# Patient Record
Sex: Male | Born: 1960 | Race: White | Hispanic: No | Smoking: Never smoker
Health system: Southern US, Community
[De-identification: ages and names within clinical notes are randomized; demographics above are authoritative.]

## PROBLEM LIST (undated history)

## (undated) DIAGNOSIS — J302 Other seasonal allergic rhinitis: Secondary | ICD-10-CM

## (undated) DIAGNOSIS — R000 Tachycardia, unspecified: Secondary | ICD-10-CM

## (undated) DIAGNOSIS — I5022 Chronic systolic (congestive) heart failure: Secondary | ICD-10-CM

## (undated) DIAGNOSIS — R1319 Other dysphagia: Secondary | ICD-10-CM

## (undated) DIAGNOSIS — I10 Essential (primary) hypertension: Secondary | ICD-10-CM

## (undated) DIAGNOSIS — I471 Supraventricular tachycardia, unspecified: Secondary | ICD-10-CM

## (undated) DIAGNOSIS — G479 Sleep disorder, unspecified: Secondary | ICD-10-CM

## (undated) DIAGNOSIS — K222 Esophageal obstruction: Secondary | ICD-10-CM

## (undated) DIAGNOSIS — D179 Benign lipomatous neoplasm, unspecified: Secondary | ICD-10-CM

## (undated) DIAGNOSIS — R079 Chest pain, unspecified: Secondary | ICD-10-CM

## (undated) DIAGNOSIS — R5383 Other fatigue: Secondary | ICD-10-CM

## (undated) DIAGNOSIS — I209 Angina pectoris, unspecified: Secondary | ICD-10-CM

## (undated) DIAGNOSIS — N400 Enlarged prostate without lower urinary tract symptoms: Secondary | ICD-10-CM

## (undated) DIAGNOSIS — Z7982 Long term (current) use of aspirin: Secondary | ICD-10-CM

## (undated) DIAGNOSIS — I4729 Other ventricular tachycardia: Secondary | ICD-10-CM

## (undated) DIAGNOSIS — M810 Age-related osteoporosis without current pathological fracture: Secondary | ICD-10-CM

## (undated) DIAGNOSIS — I77810 Thoracic aortic ectasia: Secondary | ICD-10-CM

## (undated) DIAGNOSIS — K219 Gastro-esophageal reflux disease without esophagitis: Secondary | ICD-10-CM

## (undated) DIAGNOSIS — R0609 Other forms of dyspnea: Secondary | ICD-10-CM

## (undated) DIAGNOSIS — I499 Cardiac arrhythmia, unspecified: Secondary | ICD-10-CM

## (undated) DIAGNOSIS — R55 Syncope and collapse: Secondary | ICD-10-CM

## (undated) DIAGNOSIS — Z95818 Presence of other cardiac implants and grafts: Secondary | ICD-10-CM

## (undated) DIAGNOSIS — R002 Palpitations: Secondary | ICD-10-CM

## (undated) DIAGNOSIS — I428 Other cardiomyopathies: Secondary | ICD-10-CM

## (undated) DIAGNOSIS — I5189 Other ill-defined heart diseases: Secondary | ICD-10-CM

## (undated) DIAGNOSIS — E782 Mixed hyperlipidemia: Secondary | ICD-10-CM

## (undated) DIAGNOSIS — T7840XA Allergy, unspecified, initial encounter: Secondary | ICD-10-CM

## (undated) DIAGNOSIS — I700 Atherosclerosis of aorta: Secondary | ICD-10-CM

## (undated) DIAGNOSIS — Z8249 Family history of ischemic heart disease and other diseases of the circulatory system: Secondary | ICD-10-CM

## (undated) DIAGNOSIS — I472 Ventricular tachycardia: Secondary | ICD-10-CM

## (undated) HISTORY — DX: Cardiac arrhythmia, unspecified: I49.9

## (undated) HISTORY — DX: Tachycardia, unspecified: R00.0

## (undated) HISTORY — DX: Presence of other cardiac implants and grafts: Z95.818

## (undated) HISTORY — DX: Allergy, unspecified, initial encounter: T78.40XA

## (undated) HISTORY — DX: Age-related osteoporosis without current pathological fracture: M81.0

## (undated) HISTORY — DX: Other ventricular tachycardia: I47.29

## (undated) HISTORY — DX: Ventricular tachycardia: I47.2

---
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
## 2006-08-17 ENCOUNTER — Inpatient Hospital Stay: Payer: Self-pay | Admitting: Internal Medicine

## 2006-09-08 ENCOUNTER — Ambulatory Visit: Payer: Self-pay | Admitting: Gastroenterology

## 2010-09-16 ENCOUNTER — Ambulatory Visit: Payer: Self-pay | Admitting: Urology

## 2013-01-07 ENCOUNTER — Ambulatory Visit: Payer: Self-pay | Admitting: Gastroenterology

## 2013-10-15 ENCOUNTER — Observation Stay: Payer: Self-pay | Admitting: Student

## 2013-10-15 DIAGNOSIS — I499 Cardiac arrhythmia, unspecified: Secondary | ICD-10-CM

## 2013-10-15 DIAGNOSIS — R55 Syncope and collapse: Secondary | ICD-10-CM

## 2013-10-15 DIAGNOSIS — R079 Chest pain, unspecified: Secondary | ICD-10-CM

## 2013-10-15 DIAGNOSIS — R42 Dizziness and giddiness: Secondary | ICD-10-CM

## 2013-10-15 LAB — CBC
HCT: 47.4 % (ref 40.0–52.0)
HGB: 15.6 g/dL (ref 13.0–18.0)
MCH: 29.4 pg (ref 26.0–34.0)
MCHC: 32.9 g/dL (ref 32.0–36.0)
MCV: 89 fL (ref 80–100)
PLATELETS: 220 10*3/uL (ref 150–440)
RBC: 5.3 10*6/uL (ref 4.40–5.90)
RDW: 13.1 % (ref 11.5–14.5)
WBC: 6.9 10*3/uL (ref 3.8–10.6)

## 2013-10-15 LAB — COMPREHENSIVE METABOLIC PANEL
ALT: 17 U/L (ref 12–78)
ANION GAP: 5 — AB (ref 7–16)
Albumin: 3.6 g/dL (ref 3.4–5.0)
Alkaline Phosphatase: 79 U/L
BILIRUBIN TOTAL: 0.6 mg/dL (ref 0.2–1.0)
BUN: 9 mg/dL (ref 7–18)
Calcium, Total: 8.9 mg/dL (ref 8.5–10.1)
Chloride: 103 mmol/L (ref 98–107)
Co2: 30 mmol/L (ref 21–32)
Creatinine: 0.9 mg/dL (ref 0.60–1.30)
EGFR (African American): >60
Glucose: 88 mg/dL (ref 65–99)
OSMOLALITY: 274 (ref 275–301)
POTASSIUM: 3.7 mmol/L (ref 3.5–5.1)
SGOT(AST): 14 U/L — ABNORMAL LOW (ref 15–37)
Sodium: 138 mmol/L (ref 136–145)
Total Protein: 6.9 g/dL (ref 6.4–8.2)

## 2013-10-15 LAB — TROPONIN I: Troponin-I: <0.02 ng/mL

## 2013-10-15 LAB — CK TOTAL AND CKMB (NOT AT ARMC)
CK, TOTAL: 73 U/L
CK-MB: 1.3 ng/mL (ref 0.5–3.6)

## 2013-10-15 LAB — MAGNESIUM: Magnesium: 1.9 mg/dL

## 2013-10-16 ENCOUNTER — Observation Stay (HOSPITAL_COMMUNITY)
Admission: AD | Admit: 2013-10-16 | Discharge: 2013-10-18 | Disposition: A | Discharge status: Home / Self Care | Payer: BC Managed Care – PPO | Source: Other Acute Inpatient Hospital | Attending: Internal Medicine | Admitting: Internal Medicine

## 2013-10-16 ENCOUNTER — Encounter (HOSPITAL_COMMUNITY): Payer: Self-pay | Admitting: Pharmacy Technician

## 2013-10-16 ENCOUNTER — Encounter (HOSPITAL_COMMUNITY): Payer: Self-pay | Admitting: Cardiology

## 2013-10-16 DIAGNOSIS — R079 Chest pain, unspecified: Secondary | ICD-10-CM | POA: Diagnosis not present

## 2013-10-16 DIAGNOSIS — N400 Enlarged prostate without lower urinary tract symptoms: Secondary | ICD-10-CM | POA: Diagnosis not present

## 2013-10-16 DIAGNOSIS — I471 Supraventricular tachycardia, unspecified: Principal | ICD-10-CM | POA: Insufficient documentation

## 2013-10-16 DIAGNOSIS — I2510 Atherosclerotic heart disease of native coronary artery without angina pectoris: Secondary | ICD-10-CM | POA: Insufficient documentation

## 2013-10-16 DIAGNOSIS — I209 Angina pectoris, unspecified: Secondary | ICD-10-CM

## 2013-10-16 DIAGNOSIS — I498 Other specified cardiac arrhythmias: Secondary | ICD-10-CM | POA: Insufficient documentation

## 2013-10-16 DIAGNOSIS — R5383 Other fatigue: Secondary | ICD-10-CM

## 2013-10-16 DIAGNOSIS — E785 Hyperlipidemia, unspecified: Secondary | ICD-10-CM | POA: Insufficient documentation

## 2013-10-16 DIAGNOSIS — I495 Sick sinus syndrome: Secondary | ICD-10-CM

## 2013-10-16 DIAGNOSIS — I499 Cardiac arrhythmia, unspecified: Secondary | ICD-10-CM | POA: Diagnosis present

## 2013-10-16 DIAGNOSIS — R55 Syncope and collapse: Secondary | ICD-10-CM | POA: Diagnosis not present

## 2013-10-16 DIAGNOSIS — G479 Sleep disorder, unspecified: Secondary | ICD-10-CM | POA: Insufficient documentation

## 2013-10-16 DIAGNOSIS — R000 Tachycardia, unspecified: Secondary | ICD-10-CM

## 2013-10-16 HISTORY — DX: Benign prostatic hyperplasia without lower urinary tract symptoms: N40.0

## 2013-10-16 HISTORY — DX: Sleep disorder, unspecified: G47.9

## 2013-10-16 HISTORY — DX: Syncope and collapse: R55

## 2013-10-16 HISTORY — DX: Tachycardia, unspecified: R00.0

## 2013-10-16 HISTORY — DX: Chest pain, unspecified: R07.9

## 2013-10-16 HISTORY — DX: Angina pectoris, unspecified: I20.9

## 2013-10-16 HISTORY — DX: Other fatigue: R53.83

## 2013-10-16 LAB — CBC WITH DIFFERENTIAL/PLATELET
BASOS ABS: 0 10*3/uL (ref 0.0–0.1)
Basophil %: 0.7 %
EOS PCT: 1.6 %
Eosinophil #: 0.1 10*3/uL (ref 0.0–0.7)
HCT: 48.8 % (ref 40.0–52.0)
HGB: 15.8 g/dL (ref 13.0–18.0)
LYMPHS PCT: 22.3 %
Lymphocyte #: 1.6 10*3/uL (ref 1.0–3.6)
MCH: 29.1 pg (ref 26.0–34.0)
MCHC: 32.3 g/dL (ref 32.0–36.0)
MCV: 90 fL (ref 80–100)
MONO ABS: 0.9 x10 3/mm (ref 0.2–1.0)
Monocyte %: 12.5 %
Neutrophil #: 4.4 10*3/uL (ref 1.4–6.5)
Neutrophil %: 62.9 %
Platelet: 225 10*3/uL (ref 150–440)
RBC: 5.42 10*6/uL (ref 4.40–5.90)
RDW: 13.1 % (ref 11.5–14.5)
WBC: 7 10*3/uL (ref 3.8–10.6)

## 2013-10-16 LAB — BASIC METABOLIC PANEL
Anion Gap: 7 (ref 7–16)
BUN: 11 mg/dL (ref 7–18)
CALCIUM: 8.9 mg/dL (ref 8.5–10.1)
CHLORIDE: 106 mmol/L (ref 98–107)
CO2: 28 mmol/L (ref 21–32)
CREATININE: 0.81 mg/dL (ref 0.60–1.30)
EGFR (African American): >60
Glucose: 90 mg/dL (ref 65–99)
Osmolality: 280 (ref 275–301)
POTASSIUM: 4 mmol/L (ref 3.5–5.1)
Sodium: 141 mmol/L (ref 136–145)

## 2013-10-16 LAB — COMPREHENSIVE METABOLIC PANEL
ALBUMIN: 3.4 g/dL — AB (ref 3.5–5.2)
ALT: 10 U/L (ref 0–53)
AST: 15 U/L (ref 0–37)
Alkaline Phosphatase: 76 U/L (ref 39–117)
Anion gap: 13 (ref 5–15)
BUN: 15 mg/dL (ref 6–23)
CALCIUM: 9 mg/dL (ref 8.4–10.5)
CO2: 26 mEq/L (ref 19–32)
Chloride: 101 mEq/L (ref 96–112)
Creatinine, Ser: 0.83 mg/dL (ref 0.50–1.35)
GFR calc Af Amer: >90 mL/min (ref >90)
GFR calc non Af Amer: >90 mL/min (ref >90)
GLUCOSE: 85 mg/dL (ref 70–99)
POTASSIUM: 3.7 meq/L (ref 3.7–5.3)
Sodium: 140 mEq/L (ref 137–147)
Total Bilirubin: 0.4 mg/dL (ref 0.3–1.2)
Total Protein: 6.4 g/dL (ref 6.0–8.3)

## 2013-10-16 LAB — TROPONIN I: Troponin-I: <0.02 ng/mL

## 2013-10-16 LAB — TSH: Thyroid Stimulating Horm: 0.998 u[IU]/mL

## 2013-10-16 LAB — MAGNESIUM: Magnesium: 2 mg/dL

## 2013-10-16 MED ORDER — SODIUM CHLORIDE 0.9 % IV SOLN: 250.0000 mL | INTRAVENOUS | Status: DC | PRN

## 2013-10-16 MED ORDER — HEPARIN SODIUM (PORCINE) 5000 UNIT/ML IJ SOLN
5000.0000 [IU] | Freq: Three times a day (TID) | INTRAMUSCULAR | Status: DC
  Administered 2013-10-16 – 2013-10-17 (×2): 5000 [IU] via SUBCUTANEOUS
  Filled 2013-10-16 (×5): qty 1

## 2013-10-16 MED ORDER — SODIUM CHLORIDE 0.9 % IV SOLN
INTRAVENOUS | Status: DC
  Administered 2013-10-17: 05:00:00 via INTRAVENOUS

## 2013-10-16 MED ORDER — SODIUM CHLORIDE 0.9 % IJ SOLN
3.0000 mL | Freq: Two times a day (BID) | INTRAMUSCULAR | Status: DC
  Administered 2013-10-16: 3 mL via INTRAVENOUS

## 2013-10-16 MED ORDER — ASPIRIN 81 MG PO CHEW
81.0000 mg | CHEWABLE_TABLET | ORAL | Status: AC
  Administered 2013-10-17: 81 mg via ORAL
  Filled 2013-10-16: qty 1

## 2013-10-16 MED ORDER — SODIUM CHLORIDE 0.9 % IJ SOLN: 3.0000 mL | INTRAMUSCULAR | Status: DC | PRN

## 2013-10-16 MED ORDER — SODIUM CHLORIDE 0.9 % IJ SOLN: 3.0000 mL | Freq: Two times a day (BID) | INTRAMUSCULAR | Status: DC

## 2013-10-16 NOTE — H&P (Signed)
Chief Complaint: tachycardia (chest Pain + SOB) Primary Cardiologist: New  HPI: The patient is a 53 year old male with no significant medical problems, who presented to his PCP's office yesterday for evaluation after 3-4 weeks of intermittent chest pressure/tightness, shortness of breath, dizziness and near syncope. He states that 2 weeks ago, he had a syncopal episode. This happened suddenly without prodromal symptoms. He states he did not seek medical evaluation at that time. Yesterday, he redeveloped severe symptoms of chest pain and shortness of breath, prompting him to present to Dr. Christell Faithrissman's office in [place].  There, he was felt to have supraventricular tachycardia with a heart rate up to the 280s. Subsequently, he was sent to [facility]'s emergency department. While in the ED, he had resolution of symptoms and he went back into normal sinus rhythm, with a controlled heart rate. W/u in ED was normal including CMP, Mg, TSH, cardiac enzymes and CXR. A 2D echo was obtained which demonstrated normal systolic function with an EF of 60-65% and normal valvular anatomy. He was admitted for overnight observation. There were no reported events overnight. It was decided today to transfer to [facility] for further evaluation.  [facility] has [facility] and denies any current symptoms. Telemetry demonstrates sinus bradycardia with heart rate in the mid 50s. He reports a family history of sudden death, noting that his grandfather died suddenly at the age 55. He was told that it was cardiac related. He denies alcohol and drug use. He drinks a moderate amount of caffeine on a regular basis. He drinks several cups of coffee daily and, on average, 1 energy drink a week.   Past Medical History  Diagnosis Date  . Syncope 10/16/2013  . Chest pain with high risk for cardiac etiology 10/16/2013  . 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 Fatigue due to sleep pattern disturbance 10/16/2013    No past  surgical history on file.  Family History  Problem Relation Age of Onset  . Sudden death Maternal Grandfather [id number]   Social History:  has no tobacco, alcohol, and drug history on file.  Allergies:  Allergies  Allergen Reactions  . Amoxicillin Rash    Medications Prior to Admission  Medication Sig Dispense Refill  . tamsulosin (FLOMAX) 0.4 MG CAPS capsule Take 0.4 mg by mouth daily after supper.        No results found for this or any previous visit (from the past 48 hour(s)). No results found.  Review of Systems  Constitutional: Positive for malaise/fatigue.  Respiratory: Positive for shortness of breath.   Cardiovascular: Positive for chest pain and palpitations.  Neurological: Positive for dizziness, loss of consciousness and weakness.  All other systems reviewed and are negative.   Blood pressure 125/81, pulse 57, temperature 97.8 F (36.6 C), temperature source Oral, resp. rate 19, height 6\' 3"  (1.905 m), weight 192 lb (87.091 kg), SpO2 99.00%. Physical Exam  Constitutional: He is oriented to person, place, and time. He appears well-developed and well-nourished. No distress.  Neck: No JVD present. Carotid bruit is not present.  Cardiovascular: Normal rate, regular rhythm, normal heart sounds and intact distal pulses.  Exam reveals no gallop and no friction rub.   No murmur heard. Respiratory: Effort normal and breath sounds normal. No respiratory distress. He has no wheezes. He has no rales.  GI: Soft. Bowel sounds are normal. He exhibits no distension and no mass. There is no tenderness.  Musculoskeletal: He exhibits no edema.  Neurological: He is  alert and oriented to person, place, and time.  Skin: Skin is warm and dry. He is not diaphoretic.  Psychiatric: He has a normal mood and affect. His behavior is normal.     Assessment/Plan Active Problems:   Sinus tachycardia   Chest pain with high risk for cardiac etiology   Syncope   Fatigue due to sleep pattern  disturbance   Prostatism   1. Sinus Tachycardia: his rhythm strips have been reviewed by Dr. Graciela Husbands. It appears that his ventricular rate at his PCP's office was ~150 bpm and not 300. P waves were identified. We believe this was sinus tachycardia. Continue on telemetry.   2. Chest Pain: his symptoms are concerning for unstable angina. Given his family history and associated symptoms of dyspnea, syncope/ near syncope and tachycardia, we will elect to further evaluate with a LHC. Will try to arrange for tomorrow. Will make NPO at midnight.    Sherryl Manges 10/16/2013, 6:49 PM   Sweep speed on ECG was 1/2 normal ( see standardization block) so this was sinus tach I am however concerned about his chest pain syndrome. While it is not specifically associated with exertion, his exercise routine has been cut in half over the last 3 months because of inability to complete his prior workouts. He is also significantly more fatigued following his workouts. The character of the chest pain is also worrisome with "an elephant on my chest". He uses caffeine but no other supplements.  I would favor cardiac catheterization given a reasonably high pretest likelihood for coronary disease. In the event that his coronary evaluation is negative, I would pursue further functional assessment possible with cardiopulmonary stress testing possibly with cardiac MR given the very striking change in his exercise tolerance over the last 3 months.  He also has a very unusual sleep pattern sleeping only 3 hours a night. Part of his fatigue over recent months may be chronic sleep deprivation.   He also has recurrent syncope and presyncope characterized with a stereotypical prodrome and recovery symptoms and residual orthostatic intolerance. He has been started on Flomax for prostatism. The symptoms are concurrent in time. I suggested that we stop his Flomax. We've also discussed the physiology of neurally mediated syncope, and the  importance for maintaining volume repletion. We'll undertake orthostatic vital signs.

## 2013-10-17 ENCOUNTER — Encounter (HOSPITAL_COMMUNITY): Payer: Self-pay

## 2013-10-17 ENCOUNTER — Ambulatory Visit (HOSPITAL_COMMUNITY): Admit: 2013-10-17 | Payer: Self-pay | Admitting: Internal Medicine

## 2013-10-17 ENCOUNTER — Encounter (HOSPITAL_COMMUNITY)
Admission: AD | Disposition: A | Discharge status: Home / Self Care | Payer: Self-pay | Source: Other Acute Inpatient Hospital | Attending: Internal Medicine

## 2013-10-17 DIAGNOSIS — I2510 Atherosclerotic heart disease of native coronary artery without angina pectoris: Secondary | ICD-10-CM

## 2013-10-17 DIAGNOSIS — R079 Chest pain, unspecified: Secondary | ICD-10-CM

## 2013-10-17 HISTORY — PX: LEFT HEART CATHETERIZATION WITH CORONARY ANGIOGRAM: SHX5451

## 2013-10-17 HISTORY — DX: Atherosclerotic heart disease of native coronary artery without angina pectoris: I25.10

## 2013-10-17 LAB — BASIC METABOLIC PANEL
Anion gap: 12 (ref 5–15)
BUN: 15 mg/dL (ref 6–23)
CO2: 26 mEq/L (ref 19–32)
Calcium: 9.2 mg/dL (ref 8.4–10.5)
Chloride: 100 mEq/L (ref 96–112)
Creatinine, Ser: 0.78 mg/dL (ref 0.50–1.35)
GFR calc Af Amer: >90 mL/min (ref >90)
GLUCOSE: 89 mg/dL (ref 70–99)
POTASSIUM: 4.3 meq/L (ref 3.7–5.3)
Sodium: 138 mEq/L (ref 137–147)

## 2013-10-17 LAB — CBC
HCT: 49.9 % (ref 39.0–52.0)
HEMOGLOBIN: 17.2 g/dL — AB (ref 13.0–17.0)
MCH: 30.3 pg (ref 26.0–34.0)
MCHC: 34.5 g/dL (ref 30.0–36.0)
MCV: 88 fL (ref 78.0–100.0)
PLATELETS: 240 10*3/uL (ref 150–400)
RBC: 5.67 MIL/uL (ref 4.22–5.81)
RDW: 12.7 % (ref 11.5–15.5)
WBC: 6.6 10*3/uL (ref 4.0–10.5)

## 2013-10-17 LAB — PROTIME-INR
INR: 0.99 (ref 0.00–1.49)
Prothrombin Time: 13.1 seconds (ref 11.6–15.2)

## 2013-10-17 SURGERY — SUPRAVENTRICULAR TACHYCARDIA ABLATION
Anesthesia: LOCAL

## 2013-10-17 SURGERY — LEFT HEART CATHETERIZATION WITH CORONARY ANGIOGRAM
Anesthesia: LOCAL

## 2013-10-17 MED ORDER — HEPARIN (PORCINE) IN NACL 2-0.9 UNIT/ML-% IJ SOLN
INTRAMUSCULAR | Status: AC
  Filled 2013-10-17: qty 1000

## 2013-10-17 MED ORDER — LIDOCAINE HCL (PF) 1 % IJ SOLN
INTRAMUSCULAR | Status: AC
  Filled 2013-10-17: qty 30

## 2013-10-17 MED ORDER — MIDAZOLAM HCL 2 MG/2ML IJ SOLN
INTRAMUSCULAR | Status: AC
Start: 2013-10-17 — End: 2013-10-17
  Filled 2013-10-17: qty 2

## 2013-10-17 MED ORDER — ONDANSETRON HCL 4 MG/2ML IJ SOLN: 4.0000 mg | Freq: Four times a day (QID) | INTRAMUSCULAR | Status: DC | PRN

## 2013-10-17 MED ORDER — ACETAMINOPHEN 325 MG PO TABS
650.0000 mg | ORAL_TABLET | Freq: Four times a day (QID) | ORAL | Status: DC | PRN
  Administered 2013-10-17: 650 mg via ORAL
  Filled 2013-10-17: qty 2

## 2013-10-17 MED ORDER — FENTANYL CITRATE 0.05 MG/ML IJ SOLN
INTRAMUSCULAR | Status: AC
  Filled 2013-10-17: qty 2

## 2013-10-17 MED ORDER — NITROGLYCERIN 0.2 MG/ML ON CALL CATH LAB
INTRAVENOUS | Status: AC
  Filled 2013-10-17: qty 1

## 2013-10-17 MED ORDER — SODIUM CHLORIDE 0.9 % IV SOLN: INTRAVENOUS | Status: AC

## 2013-10-17 MED ORDER — VERAPAMIL HCL 2.5 MG/ML IV SOLN
INTRAVENOUS | Status: AC
  Filled 2013-10-17: qty 2

## 2013-10-17 NOTE — Interval H&P Note (Signed)
History and Physical Interval Note:  10/17/2013 3:19 PM  [name]  has presented today for cardiac cath with the diagnosis of chest pain.  The various methods of treatment have been discussed with the patient and family. After consideration of risks, benefits and other options for treatment, the patient has consented to  Procedure(s): LEFT HEART CATHETERIZATION WITH CORONARY ANGIOGRAM (N/A) as a surgical intervention .  The patient's history has been reviewed, patient examined, no change in status, stable for surgery.  I have reviewed the patient's chart and labs.  Questions were answered to the patient's satisfaction.    Cath Lab Visit (complete for each Cath Lab visit)  Clinical Evaluation Leading to the Procedure:   ACS: No.  Non-ACS:    Anginal Classification: CCS III  Anti-ischemic medical therapy: No Therapy  Non-Invasive Test Results: No non-invasive testing performed  Prior CABG: No previous CABG        MCALHANY,CHRISTOPHER

## 2013-10-17 NOTE — H&P (View-Only) (Signed)
       Patient Name: Samer D Boehme      SUBJECTIVE:headache  But there are familiar to him  Past Medical History  Diagnosis Date  . Syncope 10/16/2013  . Chest pain with high risk for cardiac etiology 10/16/2013  . 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 Fatigue due to sleep pattern disturbance 10/16/2013    Scheduled Meds:  Scheduled Meds: . heparin subcutaneous  5,000 Units Subcutaneous 3 times per day  . sodium chloride  3 mL Intravenous Q12H  . sodium chloride  3 mL Intravenous Q12H   Continuous Infusions: . sodium chloride 75 mL/hr at 10/17/13 0510   sodium chloride, acetaminophen, sodium chloride    PHYSICAL EXAM Filed Vitals:   10/16/13 1714 10/16/13 2128 10/17/13 0423  BP: 125/81 110/72 126/90  Pulse: 57 68 65  Temp: 97.8 F (36.6 C) 97.3 F (36.3 C) 97.6 F (36.4 C)  TempSrc: Oral Oral Oral  Resp: 19 18 18  Height: 6' 3" (1.905 m)    Weight: 192 lb (87.091 kg)  187 lb 11.2 oz (85.14 kg)  SpO2: 99% 99% 100%    Well developed and nourished in no acute distress HENT normal Neck supple with JVP-flat Clear Regular rate and rhythm, no murmurs or gallops Abd-soft with active BS No Clubbing cyanosis edema Skin-warm and dry A & Oriented  Grossly normal sensory and motor function  TELEMETRY: Reviewed telemetry pt in nsr:    Intake/Output Summary (Last 24 hours) at 10/17/13 0817 Last data filed at 10/16/13 2230  Gross per 24 hour  Intake      3 ml  Output      0 ml  Net      3 ml    LABS: Basic Metabolic Panel:  Recent Labs Lab 10/16/13 2130 10/17/13 0525  NA 140 138  K 3.7 4.3  CL 101 100  CO2 26 26  GLUCOSE 85 89  BUN 15 15  CREATININE 0.83 0.78  CALCIUM 9.0 9.2   Cardiac Enzymes: No results found for this basename: CKTOTAL, CKMB, CKMBINDEX, TROPONINI,  in the last 72 hours CBC:  Recent Labs Lab 10/17/13 0525  WBC 6.6  HGB 17.2*  HCT 49.9  MCV 88.0  PLT 240   PROTIME:  Recent Labs  10/17/13 0525  LABPROT 13.1  INR 0.99   Liver  Function Tests:  Recent Labs  10/16/13 2130  AST 15  ALT 10  ALKPHOS 76  BILITOT 0.4  PROT 6.4  ALBUMIN 3.4*      ASSESSMENT AND PLAN:  Active Problems:   Sinus tachycardia   Chest pain with high risk for cardiac etiology   Syncope   Fatigue due to sleep pattern disturbance   Prostatism   Chest pain Assymetric facies v torticollis>> pt has reviewed pictures with mother who says thiat this looks like his normal For cath this am Will try ang get original ECG from [facility] office to exclude Aflutter 2:1 as there was notable peaking of pwave Anticipate discharge if cath ok  Signed, Zoraya Fiorenza MD  10/17/2013   

## 2013-10-17 NOTE — CV Procedure (Signed)
      Cardiac Catheterization Operative Report  [name] [id number] 7/9/20154:00 PM [name], MD  Procedure Performed:  1. Left Heart Catheterization 2. Selective Coronary Angiography 3. Left ventricular angiogram  Operator: Verne Carrow, MD  Arterial access site:  Right radial artery.   Indication: 53 yo male with history of arrythmias (possible atrial flutter vs SVT) being followed by the EP service. Cardiac cath to exclude CAD.                                        Procedure Details: The risks, benefits, complications, treatment options, and expected outcomes were discussed with the patient. The patient and/or family concurred with the proposed plan, giving informed consent. The patient was brought to the cath lab after IV hydration was begun and oral premedication was given. The patient was further sedated with Versed and Fentanyl. The right wrist was assessed with a reverse Allens test which was positive. The right wrist was prepped and draped in a sterile fashion. 1% lidocaine was used for local anesthesia. Using the modified Seldinger access technique, a 5 French sheath was placed in the right radial artery. 3 mg Verapamil was given through the sheath. 4500 units IV heparin was given. Standard diagnostic catheters were used to perform selective coronary angiography. A pigtail catheter was used to perform a left ventricular angiogram. The sheath was removed from the right radial artery and a Terumo hemostasis band was applied at the arteriotomy site on the right wrist.   There were no immediate complications. The patient was taken to the recovery area in stable condition.   Hemodynamic Findings: Central aortic pressure: 97/65 Left ventricular pressure: 108/0/1  Angiographic Findings:  Left main: No obstructive disease.   Left Anterior Descending Artery: Large caliber vessel that courses to the apex. The proximal vessel has mild plaque disease. The mid vessel  has diffuse 20% stenosis. There are two small caliber diagonal branches with no obstructive disease.   Circumflex Artery: Moderate caliber vessel with moderate caliber obtuse marginal branch and moderate caliber continuation of the AV groove Circumflex. No obstructive disease.   Right Coronary Artery: Large dominant vessel with mild aneurysmal dilatation distally, no focally obstructive plaque noted.   Left Ventricular Angiogram: LVEF=65%.   Impression: 1. Mild non-obstructive CAD 2. Normal LV systolic function  Recommendations: No further ischemic workup Suggest statin therapy given evidence of mild CAD. Further planning regarding his arrythmias per the EP team.        Complications:  None. The patient tolerated the procedure well.

## 2013-10-17 NOTE — Progress Notes (Signed)
       Patient Name: [name]      SUBJECTIVE:headache  But there are familiar to him  Past Medical History  Diagnosis Date  . Syncope 10/16/2013  . Chest pain with high risk for cardiac etiology 10/16/2013  . 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 Fatigue due to sleep pattern disturbance 10/16/2013    Scheduled Meds:  Scheduled Meds: . heparin subcutaneous  5,000 Units Subcutaneous 3 times per day  . sodium chloride  3 mL Intravenous Q12H  . sodium chloride  3 mL Intravenous Q12H   Continuous Infusions: . sodium chloride 75 mL/hr at 10/17/13 0510   sodium chloride, acetaminophen, sodium chloride    PHYSICAL EXAM Filed Vitals:   10/16/13 1714 10/16/13 2128 10/17/13 0423  BP: 125/81 110/72 126/90  Pulse: 57 68 65  Temp: 97.8 F (36.6 C) 97.3 F (36.3 C) 97.6 F (36.4 C)  TempSrc: Oral Oral Oral  Resp: 19 18 18   Height: 6\' 3"  (1.905 m)    Weight: 192 lb (87.091 kg)  187 lb 11.2 oz (85.14 kg)  SpO2: 99% 99% 100%    Well developed and nourished in no acute distress HENT normal Neck supple with JVP-flat Clear Regular rate and rhythm, no murmurs or gallops Abd-soft with active BS No Clubbing cyanosis edema Skin-warm and dry A & Oriented  Grossly normal sensory and motor function  TELEMETRY: Reviewed telemetry pt in nsr:    Intake/Output Summary (Last 24 hours) at 10/17/13 0817 Last data filed at 10/16/13 2230  Gross per 24 hour  Intake      3 ml  Output      0 ml  Net      3 ml    LABS: Basic Metabolic Panel:  Recent Labs Lab 10/16/13 2130 10/17/13 0525  NA 140 138  K 3.7 4.3  CL 101 100  CO2 26 26  GLUCOSE 85 89  BUN 15 15  CREATININE 0.83 0.78  CALCIUM 9.0 9.2   Cardiac Enzymes: No results found for this basename: CKTOTAL, CKMB, CKMBINDEX, TROPONINI,  in the last 72 hours CBC:  Recent Labs Lab 10/17/13 0525  WBC 6.6  HGB 17.2*  HCT 49.9  MCV 88.0  PLT 240   PROTIME:  Recent Labs  10/17/13 0525  LABPROT 13.1  INR 0.99   Liver  Function Tests:  Recent Labs  10/16/13 2130  AST 15  ALT 10  ALKPHOS 76  BILITOT 0.4  PROT 6.4  ALBUMIN 3.4*      ASSESSMENT AND PLAN:  Active Problems:   Sinus tachycardia   Chest pain with high risk for cardiac etiology   Syncope   Fatigue due to sleep pattern disturbance   Prostatism   Chest pain Assymetric facies v torticollis>> pt has reviewed pictures with mother who says thiat this looks like his normal For cath this am Will try ang get original ECG from [place] office to exclude Aflutter 2:1 as there was notable peaking of pwave Anticipate discharge if cath ok  Signed, Sherryl Manges MD  10/17/2013

## 2013-10-17 NOTE — Progress Notes (Signed)
UR Completed Robby Pirani Graves-Bigelow, RN,BSN [phone]  

## 2013-10-18 ENCOUNTER — Other Ambulatory Visit: Payer: Self-pay | Admitting: Physician Assistant

## 2013-10-18 ENCOUNTER — Telehealth: Payer: Self-pay | Admitting: *Deleted

## 2013-10-18 ENCOUNTER — Other Ambulatory Visit: Payer: Self-pay | Admitting: Internal Medicine

## 2013-10-18 DIAGNOSIS — I471 Supraventricular tachycardia: Secondary | ICD-10-CM

## 2013-10-18 DIAGNOSIS — E785 Hyperlipidemia, unspecified: Secondary | ICD-10-CM

## 2013-10-18 DIAGNOSIS — R002 Palpitations: Secondary | ICD-10-CM

## 2013-10-18 NOTE — Discharge Instructions (Signed)
Event monitor will be scheduled, the office will call with instructions  PLEASE REMEMBER TO BRING ALL OF YOUR MEDICATIONS TO EACH OF YOUR FOLLOW-UP OFFICE VISITS.  PLEASE ATTEND ALL SCHEDULED FOLLOW-UP APPOINTMENTS.   Activity: Increase activity slowly as tolerated. You may shower, but no soaking baths (or swimming) for 1 week. No driving for 2 days. No lifting over 5 lbs for 1 week. No sexual activity for 1 week.   You May Return to Work: in 1 week (if applicable)  Wound Care: You may wash cath site gently with soap and water. Keep cath site clean and dry. If you notice pain, swelling, bleeding or pus at your cath site, please call [phone].    Cardiac Cath Site Care Refer to this sheet in the next few weeks. These instructions provide you with information on caring for yourself after your procedure. Your caregiver may also give you more specific instructions. Your treatment has been planned according to current medical practices, but problems sometimes occur. Call your caregiver if you have any problems or questions after your procedure. HOME CARE INSTRUCTIONS  You may shower 24 hours after the procedure. Remove the bandage (dressing) and gently wash the site with plain soap and water. Gently pat the site dry.   Do not apply powder or lotion to the site.   Do not sit in a bathtub, swimming pool, or whirlpool for 5 to 7 days.   No bending, squatting, or lifting anything over 10 pounds (4.5 kg) as directed by your caregiver.   Inspect the site at least twice daily.   Do not drive home if you are discharged the same day of the procedure. Have someone else drive you.   You may drive 24 hours after the procedure unless otherwise instructed by your caregiver.  What to expect:  Any bruising will usually fade within 1 to 2 weeks.   Blood that collects in the tissue (hematoma) may be painful to the touch. It should usually decrease in size and tenderness within 1 to 2 weeks.  SEEK  IMMEDIATE MEDICAL CARE IF:  You have unusual pain at the site or down the affected limb.   You have redness, warmth, swelling, or pain at the site.   You have drainage (other than a small amount of blood on the dressing).   You have chills.   You have a fever or persistent symptoms for more than 72 hours.   You have a fever and your symptoms suddenly get worse.   Your leg becomes pale, cool, tingly, or numb.   You have heavy bleeding from the site. Hold pressure on the site.  Document Released: 04/30/2010 Document Revised: 03/17/2011 Document Reviewed: 04/30/2010 [facility] Patient Information 2012 [place], [place]. Radial Site Care Refer to this sheet in the next few weeks. These instructions provide you with information on caring for yourself after your procedure. Your caregiver may also give you more specific instructions. Your treatment has been planned according to current medical practices, but problems sometimes occur. Call your caregiver if you have any problems or questions after your procedure. HOME CARE INSTRUCTIONS  You may shower the day after the procedure.Remove the bandage (dressing) and gently wash the site with plain soap and water.Gently pat the site dry.  Do not apply powder or lotion to the site.  Do not submerge the affected site in water for 3 to 5 days.  Inspect the site at least twice daily.  Do not flex or bend the affected arm for  24 hours.  No lifting over 5 pounds (2.3 kg) for 5 days after your procedure.  Do not drive home if you are discharged the same day of the procedure. Have someone else drive you.  You may drive 24 hours after the procedure unless otherwise instructed by your caregiver.  Do not operate machinery or power tools for 24 hours.  A responsible adult should be with you for the first 24 hours after you arrive home. What to expect:  Any bruising will usually fade within 1 to 2 weeks.  Blood that collects in the tissue  (hematoma) may be painful to the touch. It should usually decrease in size and tenderness within 1 to 2 weeks. SEEK IMMEDIATE MEDICAL CARE IF:  You have unusual pain at the radial site.  You have redness, warmth, swelling, or pain at the radial site.  You have drainage (other than a small amount of blood on the dressing).  You have chills.  You have a fever or persistent symptoms for more than 72 hours.  You have a fever and your symptoms suddenly get worse.  Your arm becomes pale, cool, tingly, or numb.  You have heavy bleeding from the site. Hold pressure on the site. Document Released: 04/30/2010 Document Revised: 06/20/2011 Document Reviewed: 04/30/2010 [facility] Patient Information 2015 [place]. This information is not intended to replace advice given to you by your health care provider. Make sure you discuss any questions you have with your health care provider.

## 2013-10-18 NOTE — Telephone Encounter (Signed)
Rhonda (PA) called to set up fasting labs for patient  Patient aware

## 2013-10-18 NOTE — Discharge Summary (Signed)
CARDIOLOGY DISCHARGE SUMMARY   Patient ID: [name] MRN: [id number] DOB/AGE: 1960-06-18 53 y.o.  Admit date: 10/16/2013 Discharge date: 10/18/2013  PCP: Brent Moss, MD Primary Cardiologist: Dr. 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 Left Heart Catheterization 2. Selective Coronary Angiography 3. Left ventricular angiogram  Hospital Course: [name] is a 53 y.o. male with no history of CAD. He was seen by his primary care physician for 3-4 weeks of intermittent chest discomfort associated with shortness of breath syncope. He went to [facility] ER with a heart rate reportedly over 280 his symptoms spontaneously, and no ECG is available for review. He was observed overnight and transferred to [name] for further evaluation and treatment.  Telemetry showed sinus rhythm/sinus bradycardia with heart rates in the mid 50s. Dr. Graciela [name] evaluated him and felt that ruling out coronary artery disease was the first priority. Therefore, he had a heart catheterization on 07/09. Cardiac catheterization results are below. He had nonobstructive disease. Consideration has to be given to statin therapy but no medication changes are made at this time. The patient states his total cholesterol is borderline high when it is checked through his work. A fasting lipid profile and liver function testing will be performed as an outpatient.  On 07/10, he was seen by Dr. Graciela [name] and all data were reviewed. He had no arrhythmia on telemetry review and no more chest pain. An event monitor is arranged as an outpatient and he is to followup with Dr. Graciela [name]. No further  inpatient workup is indicated and he is considered stable for discharge, to follow up as an outpatient.  Labs:   Lab Results  Component Value Date   WBC 6.6 10/17/2013   HGB 17.2* 10/17/2013   HCT 49.9 10/17/2013   MCV 88.0 10/17/2013   PLT 240 10/17/2013     Recent Labs Lab 10/16/13 2130 10/17/13 0525  NA 140 138  K 3.7 4.3  CL 101 100  CO2 26 26  BUN 15 15  CREATININE 0.83 0.78  CALCIUM 9.0 9.2  PROT 6.4  --   BILITOT 0.4  --   ALKPHOS 76  --   ALT 10  --   AST 15  --   GLUCOSE 85 89    Recent Labs  10/17/13 0525  INR 0.99   Cardiac Cath: 10/17/2013 Angiographic Findings:  Left main: No obstructive disease.  Left Anterior Descending Artery: Large caliber vessel that courses to the apex. The proximal vessel has mild plaque disease. The mid vessel has diffuse 20% stenosis. There are two small caliber diagonal branches with no obstructive disease.  Circumflex Artery: Moderate caliber vessel with moderate caliber obtuse marginal branch and moderate caliber continuation of the AV groove Circumflex. No obstructive disease.  Right Coronary Artery: Large dominant vessel with mild aneurysmal dilatation distally, no focally obstructive plaque noted.  Left Ventricular Angiogram: LVEF=65%.  Impression:  1. Mild non-obstructive CAD  2. Normal LV systolic function  Recommendations: No further ischemic workup   EKG: From [name], SVT, rate 280  FOLLOW UP PLANS AND APPOINTMENTS Allergies  Allergen Reactions  . Amoxicillin Rash  Medication List         tamsulosin 0.4 MG Caps capsule  Commonly known as:  FLOMAX  Take 0.4 mg by mouth daily after supper.        Discharge Instructions   Diet - low sodium heart healthy    Complete by:  As directed      Increase activity slowly    Complete by:  As directed           Follow-up Information   Follow up with Brent MangesSteven Klein, MD On 11/26/2013. (At 10:00 am)    Specialty:  Cardiology   Contact information:   [address] [place] [phone]       Follow up with [name] CARD [name] On 10/21/2013. (Fasting blood work at 8:10 am)    Contact information:   [address] [place] [phone]       BRING ALL MEDICATIONS WITH YOU TO FOLLOW UP APPOINTMENTS  Time spent with patient to include physician time: 33 min Signed: Theodore Demarkhonda Nori Poland, PA-C 10/18/2013, 2:48 PM Co-Sign MD

## 2013-10-18 NOTE — Progress Notes (Signed)
       Patient Name: [name]      SUBJECTIVE Cath neg [address] not seen ECG  Working on gtting it   Past Medical History  Diagnosis Date  . Syncope 10/16/2013  . Chest pain with high risk for cardiac etiology 10/16/2013  . 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 Fatigue due to sleep pattern disturbance 10/16/2013    Scheduled Meds:  Scheduled Meds: . sodium chloride  3 mL Intravenous Q12H   Continuous Infusions:   acetaminophen, ondansetron (ZOFRAN) IV    PHYSICAL EXAM Filed Vitals:   10/17/13 1800 10/17/13 2027 10/17/13 2144 10/18/13 0618  BP: 115/74 130/78 115/75 111/58  Pulse:  78 57 70  Temp:   98.4 F (36.9 C) 98 F (36.7 C)  TempSrc:   Oral Oral  Resp:   18 18  Height:      Weight:    187 lb 9.7 oz (85.098 kg)  SpO2:   99% 97%    Well developed and nourished in no acute distress HENT normal Neck supple with JVP-flat Clear Regular rate and rhythm, no murmurs or gallops Abd-soft with active BS No Clubbing cyanosis edema Skin-warm and dry A & Oriented  Grossly normal sensory and motor function  TELEMETRY: Reviewed telemetry pt in nsr: with rungs of atrial tachycardia    Intake/Output Summary (Last 24 hours) at 10/18/13 0725 Last data filed at 10/17/13 2145  Gross per 24 hour  Intake      0 ml  Output    250 ml  Net   -250 ml    LABS: Basic Metabolic Panel:  Recent Labs Lab 10/16/13 2130 10/17/13 0525  NA 140 138  K 3.7 4.3  CL 101 100  CO2 26 26  GLUCOSE 85 89  BUN 15 15  CREATININE 0.83 0.78  CALCIUM 9.0 9.2   Cardiac Enzymes: No results found for this basename: CKTOTAL, CKMB, CKMBINDEX, TROPONINI,  in the last 72 hours CBC:  Recent Labs Lab 10/17/13 0525  WBC 6.6  HGB 17.2*  HCT 49.9  MCV 88.0  PLT 240   PROTIME:  Recent Labs  10/17/13 0525  LABPROT 13.1  INR 0.99   Liver Function Tests:  Recent Labs  10/16/13 2130  AST 15  ALT 10  ALKPHOS 76  BILITOT 0.4  PROT 6.4  ALBUMIN 3.4*      ASSESSMENT AND  PLAN:  Active Problems:   Sinus tachycardia   Chest pain with high risk for cardiac etiology   Syncope   Fatigue due to sleep pattern disturbance   Prostatism   Chest pain   Ok to go home Atrial tach may be resposible  Will use 30 day recorder to truy anc correlate arrhtymia with symptoms of exrecise intolerance or presyncope Will wtihould therapy for now so as to try and make a clear cut correleation Will anticipate ablation so as to avoid long term meds [place] 4 weeks Schedule outpt eval withJA/GT 6=8 weels Signed, Sherryl Manges MD  10/18/2013

## 2013-10-18 NOTE — Progress Notes (Signed)
UR Completed Chancie Lampert Graves-Bigelow, RN,BSN [phone]  

## 2013-10-21 ENCOUNTER — Encounter: Payer: Self-pay | Admitting: *Deleted

## 2013-10-21 ENCOUNTER — Ambulatory Visit (INDEPENDENT_AMBULATORY_CARE_PROVIDER_SITE_OTHER): Payer: BC Managed Care – PPO | Admitting: *Deleted

## 2013-10-21 DIAGNOSIS — E785 Hyperlipidemia, unspecified: Secondary | ICD-10-CM

## 2013-10-21 NOTE — Progress Notes (Unsigned)
Patient ID: [name], male   DOB: Mar 18, 1961, 53 y.o.   MRN: [id number] Patient enrolled for [employer] to mail a 30 day cardiac event monitor to his home.

## 2013-10-21 NOTE — Progress Notes (Signed)
Patient ID: [name], male   DOB: 02-May-1960, 53 y.o.   MRN: [id number] Patient enrolled for [employer] to mail an [employer] 48 hour recorder to cardiac event monitor to the patients home.

## 2013-10-22 LAB — LIPID PANEL
Chol/HDL Ratio: 3.4 ratio units (ref 0.0–5.0)
Cholesterol, Total: 187 mg/dL (ref 100–199)
HDL: 55 mg/dL (ref >39)
LDL Calculated: 117 mg/dL — ABNORMAL HIGH (ref 0–99)
Triglycerides: 73 mg/dL (ref 0–149)
VLDL Cholesterol Cal: 15 mg/dL (ref 5–40)

## 2013-10-22 LAB — HEPATIC FUNCTION PANEL
ALT: 10 IU/L (ref 0–44)
AST: 16 IU/L (ref 0–40)
Albumin: 4 g/dL (ref 3.5–5.5)
Alkaline Phosphatase: 73 IU/L (ref 39–117)
BILIRUBIN TOTAL: 0.3 mg/dL (ref 0.0–1.2)
Bilirubin, Direct: 0.1 mg/dL (ref 0.00–0.40)
Total Protein: 6.3 g/dL (ref 6.0–8.5)

## 2013-11-19 ENCOUNTER — Encounter: Payer: Self-pay | Admitting: *Deleted

## 2013-11-21 ENCOUNTER — Encounter: Payer: Self-pay | Admitting: *Deleted

## 2013-11-21 NOTE — Progress Notes (Signed)
Patient ID: [name], male   DOB: 07-12-1960, 53 y.o.   MRN: [id number] [employer] mailed a KOH monitor to the patients home.  His first recording was 10/25/2013 and expected end of service is 11/23/2013.

## 2013-11-26 ENCOUNTER — Ambulatory Visit (INDEPENDENT_AMBULATORY_CARE_PROVIDER_SITE_OTHER): Payer: BC Managed Care – PPO | Admitting: Internal Medicine

## 2013-11-26 ENCOUNTER — Encounter: Payer: Self-pay | Admitting: Internal Medicine

## 2013-11-26 VITALS — BP 100/80 | HR 73 | Ht 75.5 in | Wt 191.5 lb

## 2013-11-26 DIAGNOSIS — R079 Chest pain, unspecified: Secondary | ICD-10-CM

## 2013-11-26 NOTE — Progress Notes (Signed)
      Patient Care Team: [name], MD as PCP - General (Family Medicine)   HPI  [name] is a 53 y.o. male Seen in followup for an episode of chest pain associated with a "heart rate over 280".  He underwent catheterization demonstrated no obstructive disease; left ventricular function was normal.  We finally obtained recordings and the sweep speed of the recording was erroneous  He was given an event recorder; these data are not yet available for review. He did have a couple episodes of lightheadedness.  he has not had recurrent tachycardia  Past Medical History  Diagnosis Date  . Syncope 10/16/2013  . Chest pain with high risk for cardiac etiology 10/16/2013  . 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 Fatigue due to sleep pattern disturbance 10/16/2013  . Tachycardia     Past Surgical History  Procedure Laterality Date  . No past surgeries      Current Outpatient Prescriptions  Medication Sig Dispense Refill  . tamsulosin (FLOMAX) 0.4 MG CAPS capsule Take 0.4 mg by mouth daily after supper.       No current facility-administered medications for this visit.    Allergies  Allergen Reactions  . Amoxicillin Rash    Review of Systems negative except from HPI and PMH  Physical Exam BP 100/80  Pulse 73  Ht 6' 3.5" (1.918 m)  Wt 191 lb 8 oz (86.864 kg)  BMI 23.61 kg/m2 Well developed and nourished in no acute distress HENT normal Neck supple with JVP-flat Clear Regular rate and rhythm, no murmurs or gallops Abd-soft with active BS No Clubbing cyanosis edema Skin-warm and dry A & Oriented  Grossly normal sensory and motor function     Assessment and  Plan  Syncope-without recurrences  Chest pain  Tachypalpitations-mechanism not clearly defined.  In the event that he continues to have symptoms, would recommend the implantable loop recorder; we have also discussed the use of [employer]. This could be helpful if P wave morphologies were distinctive  With negative  catheterization chest pain is presumed at this point to be nonischemic

## 2013-11-26 NOTE — Patient Instructions (Signed)
Your physician recommends that you schedule a follow-up appointment in: 3 months with Dr. Klein.  Your physician recommends that you continue on your current medications as directed. Please refer to the Current Medication list given to you today.   

## 2013-12-03 ENCOUNTER — Encounter: Payer: Self-pay | Admitting: *Deleted

## 2013-12-03 ENCOUNTER — Encounter: Payer: Self-pay | Admitting: Internal Medicine

## 2013-12-03 ENCOUNTER — Ambulatory Visit (INDEPENDENT_AMBULATORY_CARE_PROVIDER_SITE_OTHER): Payer: BC Managed Care – PPO | Admitting: Internal Medicine

## 2013-12-03 VITALS — BP 126/70 | HR 68 | Ht 75.0 in | Wt 193.8 lb

## 2013-12-03 DIAGNOSIS — R079 Chest pain, unspecified: Secondary | ICD-10-CM

## 2013-12-03 DIAGNOSIS — I498 Other specified cardiac arrhythmias: Secondary | ICD-10-CM

## 2013-12-03 DIAGNOSIS — I471 Supraventricular tachycardia: Secondary | ICD-10-CM

## 2013-12-03 DIAGNOSIS — R55 Syncope and collapse: Secondary | ICD-10-CM

## 2013-12-03 DIAGNOSIS — Z01812 Encounter for preprocedural laboratory examination: Secondary | ICD-10-CM

## 2013-12-03 LAB — BASIC METABOLIC PANEL
BUN: 14 mg/dL (ref 6–23)
CO2: 25 mEq/L (ref 19–32)
Calcium: 9 mg/dL (ref 8.4–10.5)
Chloride: 106 mEq/L (ref 96–112)
Creatinine, Ser: 0.7 mg/dL (ref 0.4–1.5)
GFR: 120.46 mL/min (ref >60.00)
Glucose, Bld: 85 mg/dL (ref 70–99)
Potassium: 3.9 mEq/L (ref 3.5–5.1)
SODIUM: 138 meq/L (ref 135–145)

## 2013-12-03 LAB — CBC WITH DIFFERENTIAL/PLATELET
BASOS ABS: 0 10*3/uL (ref 0.0–0.1)
Basophils Relative: 0.7 % (ref 0.0–3.0)
EOS ABS: 0.1 10*3/uL (ref 0.0–0.7)
Eosinophils Relative: 1.7 % (ref 0.0–5.0)
HCT: 46.6 % (ref 39.0–52.0)
HEMOGLOBIN: 15.7 g/dL (ref 13.0–17.0)
LYMPHS PCT: 27.4 % (ref 12.0–46.0)
Lymphs Abs: 1.5 10*3/uL (ref 0.7–4.0)
MCHC: 33.6 g/dL (ref 30.0–36.0)
MCV: 89.6 fl (ref 78.0–100.0)
MONOS PCT: 12.2 % — AB (ref 3.0–12.0)
Monocytes Absolute: 0.7 10*3/uL (ref 0.1–1.0)
NEUTROS PCT: 58 % (ref 43.0–77.0)
Neutro Abs: 3.3 10*3/uL (ref 1.4–7.7)
PLATELETS: 266 10*3/uL (ref 150.0–400.0)
RBC: 5.2 Mil/uL (ref 4.22–5.81)
RDW: 13.3 % (ref 11.5–15.5)
WBC: 5.6 10*3/uL (ref 4.0–10.5)

## 2013-12-03 NOTE — Assessment & Plan Note (Signed)
Etiology of the syncope is unclear. He has had 2 episodes. These resulted in postural collapse. I recommended insertion of an implantable loop recorder. This be scheduled in the next week or so.

## 2013-12-03 NOTE — Assessment & Plan Note (Signed)
Note the results of his catheterization. He has no obstructive coronary disease.

## 2013-12-03 NOTE — Progress Notes (Signed)
      HPI [name] is referred today by Dr. Graciela Husbands for consideration for EP study and catheter ablation. The patient is a very physically fit middle-aged man who has a history of palpitations, as well as 2 episodes of frank syncope, for which he actually fell out of his chair. The patient notes that he has other episodes where he feels like he might pass out. He gets lightheaded, feels weak, and eventually the episodes in the past although some resultant syncope. The patient worked cardiac monitor while he was in the hospital which falsely suggested a heart rate of 280 beats a minute. It turns out that the problem was with the paper speed and his actual heart rate was probably sinus tachycardia or atrial tachycardia at 140 beats per minute. No other significant cardiac history. Because of his chest pain, he underwent catheterization which demonstrated no coronary disease. His episodes have been present for over a year. Allergies  Allergen Reactions  . Amoxicillin Rash     Current Outpatient Prescriptions  Medication Sig Dispense Refill  . aspirin 81 MG chewable tablet Chew 81 mg by mouth daily.      . tamsulosin (FLOMAX) 0.4 MG CAPS capsule Take 0.4 mg by mouth daily after supper.       No current facility-administered medications for this visit.     Past Medical History  Diagnosis Date  . Syncope 10/16/2013  . Chest pain with high risk for cardiac etiology 10/16/2013  . 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 Fatigue due to sleep pattern disturbance 10/16/2013  . Tachycardia   . Prostatism   . Sinus tachycardia     ROS:   All systems reviewed and negative except as noted in the HPI.   Past Surgical History  Procedure Laterality Date  . No past surgeries       Family History  Problem Relation Age of Onset  . Sudden death Maternal Grandfather 38     History   Social History  . Marital Status: Widowed    Spouse Name: N/A    Number of Children: N/A  . Years of Education: N/A    Occupational History  . Not on file.   Social History Main Topics  . Smoking status: Never Smoker   . Smokeless tobacco: Never Used  . Alcohol Use: No  . Drug Use: No  . Sexual Activity: Yes   Other Topics Concern  . Not on file   Social History Narrative  . No narrative on file     BP 126/70  Pulse 68  Ht 6\' 3"  (1.905 m)  Wt 193 lb 12.8 oz (87.907 kg)  BMI 24.22 kg/m2  Physical Exam:  Well appearing 53 year old man, NAD HEENT: Unremarkable Neck:  No JVD, no thyromegally Back:  No CVA tenderness Lungs:  Clear HEART:  Regular rate rhythm, no murmurs, no rubs, no clicks Abd:  soft, positive bowel sounds, no organomegally, no rebound, no guarding Ext:  2 plus pulses, no edema, no cyanosis, no clubbing Skin:  No rashes no nodules Neuro:  CN II through XII intact, motor grossly intact  EKG Normal sinus rhythm  Assess/Plan:

## 2013-12-03 NOTE — Patient Instructions (Signed)
Your physician recommends that you continue on your current medications as directed. Please refer to the Current Medication list given to you today.  Pre procedure labs today: BMET, CBCD  Your physician recommends a loop recorder implant. Please see instruction sheet given to you today.   Your wound check is scheduled for 12/19/13 at 2:30 p.m. at [address]

## 2013-12-04 ENCOUNTER — Encounter (HOSPITAL_COMMUNITY): Payer: Self-pay | Admitting: Pharmacy Technician

## 2013-12-09 ENCOUNTER — Encounter (HOSPITAL_COMMUNITY)
Admission: RE | Disposition: A | Discharge status: Home / Self Care | Payer: Self-pay | Source: Ambulatory Visit | Attending: Internal Medicine

## 2013-12-09 ENCOUNTER — Ambulatory Visit (HOSPITAL_COMMUNITY)
Admission: RE | Admit: 2013-12-09 | Discharge: 2013-12-09 | Disposition: A | Discharge status: Home / Self Care | Payer: BC Managed Care – PPO | Source: Ambulatory Visit | Attending: Internal Medicine | Admitting: Internal Medicine

## 2013-12-09 DIAGNOSIS — R000 Tachycardia, unspecified: Secondary | ICD-10-CM | POA: Diagnosis not present

## 2013-12-09 DIAGNOSIS — Z7982 Long term (current) use of aspirin: Secondary | ICD-10-CM | POA: Insufficient documentation

## 2013-12-09 DIAGNOSIS — Z95818 Presence of other cardiac implants and grafts: Secondary | ICD-10-CM

## 2013-12-09 DIAGNOSIS — R55 Syncope and collapse: Secondary | ICD-10-CM

## 2013-12-09 HISTORY — PX: LOOP RECORDER IMPLANT: SHX5477

## 2013-12-09 HISTORY — PX: LOOP RECORDER IMPLANT: SHX5954

## 2013-12-09 HISTORY — DX: Presence of other cardiac implants and grafts: Z95.818

## 2013-12-09 SURGERY — LOOP RECORDER IMPLANT
Anesthesia: LOCAL

## 2013-12-09 MED ORDER — LIDOCAINE-EPINEPHRINE 1 %-1:100000 IJ SOLN
INTRAMUSCULAR | Status: AC
  Filled 2013-12-09: qty 1

## 2013-12-09 MED ORDER — ONDANSETRON HCL 4 MG/2ML IJ SOLN
INTRAMUSCULAR | Status: AC
  Filled 2013-12-09: qty 2

## 2013-12-09 MED ORDER — ONDANSETRON HCL 4 MG/2ML IJ SOLN
4.0000 mg | Freq: Once | INTRAMUSCULAR | Status: AC
  Administered 2013-12-09: 4 mg via INTRAVENOUS

## 2013-12-09 NOTE — H&P (View-Only) (Signed)
      HPI [name] is referred today by Dr. Klein for consideration for EP study and catheter ablation. The patient is a very physically fit middle-aged man who has a history of palpitations, as well as 2 episodes of frank syncope, for which he actually fell out of his chair. The patient notes that he has other episodes where he feels like he might pass out. He gets lightheaded, feels weak, and eventually the episodes in the past although some resultant syncope. The patient worked cardiac monitor while he was in the hospital which falsely suggested a heart rate of 280 beats a minute. It turns out that the problem was with the paper speed and his actual heart rate was probably sinus tachycardia or atrial tachycardia at 140 beats per minute. No other significant cardiac history. Because of his chest pain, he underwent catheterization which demonstrated no coronary disease. His episodes have been present for over a year. Allergies  Allergen Reactions  . Amoxicillin Rash     Current Outpatient Prescriptions  Medication Sig Dispense Refill  . aspirin 81 MG chewable tablet Chew 81 mg by mouth daily.      . tamsulosin (FLOMAX) 0.4 MG CAPS capsule Take 0.4 mg by mouth daily after supper.       No current facility-administered medications for this visit.     Past Medical History  Diagnosis Date  . Syncope 10/16/2013  . Chest pain with high risk for cardiac etiology 10/16/2013  . 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 Fatigue due to sleep pattern disturbance 10/16/2013  . Tachycardia   . Prostatism   . Sinus tachycardia     ROS:   All systems reviewed and negative except as noted in the HPI.   Past Surgical History  Procedure Laterality Date  . No past surgeries       Family History  Problem Relation Age of Onset  . Sudden death Maternal Grandfather 38     History   Social History  . Marital Status: Widowed    Spouse Name: N/A    Number of Children: N/A  . Years of Education: N/A    Occupational History  . Not on file.   Social History Main Topics  . Smoking status: Never Smoker   . Smokeless tobacco: Never Used  . Alcohol Use: No  . Drug Use: No  . Sexual Activity: Yes   Other Topics Concern  . Not on file   Social History Narrative  . No narrative on file     BP 126/70  Pulse 68  Ht 6' 3" (1.905 m)  Wt 193 lb 12.8 oz (87.907 kg)  BMI 24.22 kg/m2  Physical Exam:  Well appearing 53-year-old man, NAD HEENT: Unremarkable Neck:  No JVD, no thyromegally Back:  No CVA tenderness Lungs:  Clear HEART:  Regular rate rhythm, no murmurs, no rubs, no clicks Abd:  soft, positive bowel sounds, no organomegally, no rebound, no guarding Ext:  2 plus pulses, no edema, no cyanosis, no clubbing Skin:  No rashes no nodules Neuro:  CN II through XII intact, motor grossly intact  EKG Normal sinus rhythm  Assess/Plan: 

## 2013-12-09 NOTE — Interval H&P Note (Signed)
History and Physical Interval Note:  12/09/2013 8:00 AM  [name]  has presented today for surgery, with the diagnosis of vtach  The various methods of treatment have been discussed with the patient and family. After consideration of risks, benefits and other options for treatment, the patient has consented to  Procedure(s): LOOP RECORDER IMPLANT (N/A) as a surgical intervention .  The patient's history has been reviewed, patient examined, no change in status, stable for surgery.  I have reviewed the patient's chart and labs.  Questions were answered to the patient's satisfaction.     Sherryl Manges

## 2013-12-09 NOTE — CV Procedure (Signed)
Pre op Dx  Syncope and tchycardia Post op Dx    Procedure  Loop Recorder implantation  After routine prep and drape of the left parasternal area, a small incision was created. A Med[employer]onic LINQ Reveal Loop Recorder  Serial Number  [id number] was inserted.    SteriStrip dressing was  applied.  The patient tolerated the procedure with periprocedural nausea and hypotension consistent with vagal episode Treated with trendelenberg zofran and normal Saline  stablized for discharge

## 2013-12-09 NOTE — Progress Notes (Signed)
Pt c/o feeling nauseaous. PIV started.

## 2013-12-10 ENCOUNTER — Encounter (HOSPITAL_COMMUNITY): Payer: Self-pay | Admitting: *Deleted

## 2013-12-19 ENCOUNTER — Ambulatory Visit (INDEPENDENT_AMBULATORY_CARE_PROVIDER_SITE_OTHER): Payer: BC Managed Care – PPO | Admitting: *Deleted

## 2013-12-19 DIAGNOSIS — R55 Syncope and collapse: Secondary | ICD-10-CM

## 2013-12-19 LAB — MDC_IDC_ENUM_SESS_TYPE_INCLINIC

## 2013-12-19 NOTE — Progress Notes (Signed)
ILR wound check.  Steri strips removed, wound well healed.  R-waves 0.54-0.56mV.  1 symptom recorded episode by the patient for dizziness shows SR.  2 tachy episodes recorded show noise.  ROV in November with Dr. Graciela Husbands.

## 2013-12-20 ENCOUNTER — Encounter: Payer: Self-pay | Admitting: Internal Medicine

## 2014-01-08 ENCOUNTER — Ambulatory Visit (INDEPENDENT_AMBULATORY_CARE_PROVIDER_SITE_OTHER): Payer: BC Managed Care – PPO | Admitting: *Deleted

## 2014-01-08 ENCOUNTER — Encounter: Payer: Self-pay | Admitting: Internal Medicine

## 2014-01-08 DIAGNOSIS — R55 Syncope and collapse: Secondary | ICD-10-CM

## 2014-01-13 ENCOUNTER — Encounter: Payer: Self-pay | Admitting: Internal Medicine

## 2014-01-17 LAB — MDC_IDC_ENUM_SESS_TYPE_REMOTE: Date Time Interrogation Session: 20150926040500

## 2014-01-17 NOTE — Progress Notes (Signed)
Loop recorder 

## 2014-01-20 ENCOUNTER — Telehealth: Payer: Self-pay | Admitting: Internal Medicine

## 2014-01-20 NOTE — Telephone Encounter (Signed)
Spoke w/pt in regards to monitor. Last transmission received was on 01-15-14. Pt to move monitor close to window and try to send transmission. Pt to call with any furthur questions.

## 2014-01-20 NOTE — Telephone Encounter (Signed)
New message     Talk to someone regarding his loop recorder---are we getting any recordings?

## 2014-01-24 ENCOUNTER — Encounter: Payer: Self-pay | Admitting: Internal Medicine

## 2014-01-27 ENCOUNTER — Encounter: Payer: Self-pay | Admitting: Internal Medicine

## 2014-01-28 ENCOUNTER — Encounter: Payer: Self-pay | Admitting: Internal Medicine

## 2014-02-05 ENCOUNTER — Encounter: Payer: Self-pay | Admitting: Internal Medicine

## 2014-02-07 ENCOUNTER — Ambulatory Visit (INDEPENDENT_AMBULATORY_CARE_PROVIDER_SITE_OTHER): Payer: BC Managed Care – PPO | Admitting: *Deleted

## 2014-02-07 ENCOUNTER — Encounter: Payer: Self-pay | Admitting: Internal Medicine

## 2014-02-07 DIAGNOSIS — R55 Syncope and collapse: Secondary | ICD-10-CM

## 2014-02-12 NOTE — Progress Notes (Signed)
Loop recorder 

## 2014-02-18 ENCOUNTER — Encounter: Payer: Self-pay | Admitting: Internal Medicine

## 2014-02-18 LAB — MDC_IDC_ENUM_SESS_TYPE_REMOTE: Date Time Interrogation Session: 20151030040500

## 2014-02-20 ENCOUNTER — Encounter: Payer: Self-pay | Admitting: Internal Medicine

## 2014-03-04 ENCOUNTER — Encounter: Payer: BC Managed Care – PPO | Admitting: Internal Medicine

## 2014-03-07 ENCOUNTER — Ambulatory Visit (INDEPENDENT_AMBULATORY_CARE_PROVIDER_SITE_OTHER): Payer: BC Managed Care – PPO | Admitting: *Deleted

## 2014-03-07 DIAGNOSIS — R55 Syncope and collapse: Secondary | ICD-10-CM

## 2014-03-14 ENCOUNTER — Encounter: Payer: Self-pay | Admitting: Internal Medicine

## 2014-03-14 NOTE — Progress Notes (Signed)
Loop recorder 

## 2014-03-20 ENCOUNTER — Encounter (HOSPITAL_COMMUNITY): Payer: Self-pay | Admitting: Cardiovascular Disease

## 2014-04-08 ENCOUNTER — Encounter: Payer: Self-pay | Admitting: Internal Medicine

## 2014-04-08 ENCOUNTER — Ambulatory Visit (INDEPENDENT_AMBULATORY_CARE_PROVIDER_SITE_OTHER): Payer: BC Managed Care – PPO | Admitting: *Deleted

## 2014-04-08 DIAGNOSIS — R55 Syncope and collapse: Secondary | ICD-10-CM

## 2014-04-08 LAB — MDC_IDC_ENUM_SESS_TYPE_REMOTE
Date Time Interrogation Session: 20151229050500
Date Time Interrogation Session: 20151209050500

## 2014-04-09 NOTE — Progress Notes (Signed)
Loop recorder 

## 2014-04-10 ENCOUNTER — Encounter: Payer: Self-pay | Admitting: Internal Medicine

## 2014-04-15 ENCOUNTER — Encounter: Payer: BC Managed Care – PPO | Admitting: Internal Medicine

## 2014-04-21 ENCOUNTER — Encounter: Payer: Self-pay | Admitting: Internal Medicine

## 2014-05-08 ENCOUNTER — Ambulatory Visit (INDEPENDENT_AMBULATORY_CARE_PROVIDER_SITE_OTHER): Payer: Self-pay | Admitting: *Deleted

## 2014-05-08 ENCOUNTER — Encounter: Payer: Self-pay | Admitting: Internal Medicine

## 2014-05-08 DIAGNOSIS — R55 Syncope and collapse: Secondary | ICD-10-CM

## 2014-05-08 LAB — MDC_IDC_ENUM_SESS_TYPE_REMOTE: MDC IDC SESS DTM: 20160128050500

## 2014-05-08 NOTE — Progress Notes (Signed)
Loop recorder 

## 2014-05-09 ENCOUNTER — Encounter: Payer: Self-pay | Admitting: Internal Medicine

## 2014-05-12 DIAGNOSIS — I4729 Other ventricular tachycardia: Secondary | ICD-10-CM

## 2014-05-12 HISTORY — DX: Other ventricular tachycardia: I47.29

## 2014-05-13 ENCOUNTER — Encounter: Payer: Self-pay | Admitting: Internal Medicine

## 2014-05-13 ENCOUNTER — Ambulatory Visit (INDEPENDENT_AMBULATORY_CARE_PROVIDER_SITE_OTHER): Payer: BLUE CROSS/BLUE SHIELD | Admitting: Internal Medicine

## 2014-05-13 VITALS — BP 102/80 | HR 71 | Ht 75.0 in | Wt 200.8 lb

## 2014-05-13 DIAGNOSIS — I471 Supraventricular tachycardia: Secondary | ICD-10-CM

## 2014-05-13 DIAGNOSIS — Z01818 Encounter for other preprocedural examination: Secondary | ICD-10-CM

## 2014-05-13 DIAGNOSIS — I472 Ventricular tachycardia: Secondary | ICD-10-CM

## 2014-05-13 DIAGNOSIS — R000 Tachycardia, unspecified: Secondary | ICD-10-CM

## 2014-05-13 DIAGNOSIS — Z959 Presence of cardiac and vascular implant and graft, unspecified: Secondary | ICD-10-CM

## 2014-05-13 DIAGNOSIS — I4729 Other ventricular tachycardia: Secondary | ICD-10-CM

## 2014-05-13 LAB — MDC_IDC_ENUM_SESS_TYPE_INCLINIC

## 2014-05-13 NOTE — Progress Notes (Signed)
      Patient Care Team: [name], MD as PCP - General (Family Medicine)   HPI  [name] is a 54 y.o. male Seen in followup for an episode of chest pain associated with a "heart rate over 280".  He underwent catheterization demonstrated no obstructive disease; left ventricular function was normal.  We finally obtained recordings and the sweep speed of the recording was erroneous  He is now status post loop recorder implantation-Medtronic Linq.  he has had a few symptomatic episodes. They're interrogation is outlined below.    Past Medical History  Diagnosis Date  . Syncope 10/16/2013  . Chest pain with high risk for cardiac etiology 10/16/2013  . 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 Fatigue due to sleep pattern disturbance 10/16/2013  . Tachycardia   . Prostatism   . Sinus tachycardia     Past Surgical History  Procedure Laterality Date  . Loop recorder implant  12-09-13    MDT LINQ implanted by Dr Graciela Husbands for syncope  . Left heart catheterization with coronary angiogram N/A 10/17/2013    Procedure: LEFT HEART CATHETERIZATION WITH CORONARY ANGIOGRAM;  Surgeon: Kathleene Hazel, MD;  Location: [facility] CATH LAB;  Service: Cardiovascular;  Laterality: N/A;  . Loop recorder implant N/A 12/09/2013    Procedure: LOOP RECORDER IMPLANT;  Surgeon: Duke Salvia, MD;  Location: [facility] CATH LAB;  Service: Cardiovascular;  Laterality: N/A;    Current Outpatient Prescriptions  Medication Sig Dispense Refill  . aspirin 81 MG chewable tablet Chew 81 mg by mouth daily.    . tamsulosin (FLOMAX) 0.4 MG CAPS capsule Take 0.4 mg by mouth daily after supper.     No current facility-administered medications for this visit.    Allergies  Allergen Reactions  . Amoxicillin Rash    Review of Systems negative except from HPI and PMH  Physical Exam BP 102/80 mmHg  Pulse 71  Ht 6\' 3"  (1.905 m)  Wt 200 lb 12 oz (91.06 kg)  BMI 25.09 kg/m2 Well developed and nourished in no acute distress HENT  normal Neck supple with JVP-flat Clear Regular rate and rhythm, no murmurs or gallops Abd-soft with active BS No Clubbing cyanosis edema Skin-warm and dry A & Oriented  Grossly normal sensory and motor function     Assessment and  Plan  Syncope-   Chest pain  Tachypalpitations-mechanism not clearly defined.  Nonsustained ventricular tachycardia  Implantable loop recorder-[employer]onic  The patient somewhat unexpectedly had nonsustained ventricular tachycardia associated with one of his symptomatic episodes. The specificity however is attenuated by the fact that 4 other symptomatic episodes were associated with sinus rhythm. In the context of normal left ventricular function and no coronary disease at this point treatment for this would be predicated on symptoms. We will continue to monitor this.  He is anticipating dental extraction. The degree that epinephrine can be minimized I would recommend that with anesthetic. Otherwise no special precautions need be taken from a procedural point of view. I would however recommended consideration of having it done at a place where he can be monitored given the identification of nonsustained ventricular tachycardia

## 2014-05-13 NOTE — Patient Instructions (Signed)
Your physician recommends that you schedule a follow-up appointment in: 4 months.  Your physician recommends that you continue on your current medications as directed. Please refer to the Current Medication list given to you today.  

## 2014-05-19 ENCOUNTER — Encounter: Payer: Self-pay | Admitting: Internal Medicine

## 2014-05-20 ENCOUNTER — Encounter: Payer: Self-pay | Admitting: Internal Medicine

## 2014-05-22 ENCOUNTER — Encounter: Payer: Self-pay | Admitting: Internal Medicine

## 2014-06-06 ENCOUNTER — Ambulatory Visit (INDEPENDENT_AMBULATORY_CARE_PROVIDER_SITE_OTHER): Payer: BLUE CROSS/BLUE SHIELD | Admitting: *Deleted

## 2014-06-06 ENCOUNTER — Telehealth: Payer: Self-pay | Admitting: *Deleted

## 2014-06-06 DIAGNOSIS — R55 Syncope and collapse: Secondary | ICD-10-CM

## 2014-06-06 NOTE — Telephone Encounter (Signed)
Faxed last office note as requested by MD

## 2014-06-06 NOTE — Telephone Encounter (Signed)
[facility] nurse called stating they have questions on patient surgery Monday.  They are closing soon and would like [place] to try and call by 1:30 being the latest.   Questions about cath and stuff, just have a couple of questions before surgery.

## 2014-06-12 NOTE — Progress Notes (Signed)
Loop recorder 

## 2014-06-24 ENCOUNTER — Encounter: Payer: Self-pay | Admitting: Internal Medicine

## 2014-06-25 ENCOUNTER — Encounter: Payer: Self-pay | Admitting: Internal Medicine

## 2014-06-26 LAB — MDC_IDC_ENUM_SESS_TYPE_REMOTE
MDC IDC SESS DTM: 20160227011225
Zone Setting Detection Interval: 2000 ms
Zone Setting Detection Interval: 3000 ms
Zone Setting Detection Interval: 340 ms

## 2014-06-27 ENCOUNTER — Encounter: Payer: Self-pay | Admitting: Internal Medicine

## 2014-07-01 ENCOUNTER — Encounter: Payer: Self-pay | Admitting: Internal Medicine

## 2014-07-07 ENCOUNTER — Ambulatory Visit (INDEPENDENT_AMBULATORY_CARE_PROVIDER_SITE_OTHER): Payer: BLUE CROSS/BLUE SHIELD | Admitting: *Deleted

## 2014-07-07 DIAGNOSIS — R55 Syncope and collapse: Secondary | ICD-10-CM | POA: Diagnosis not present

## 2014-07-08 ENCOUNTER — Encounter: Payer: Self-pay | Admitting: Internal Medicine

## 2014-07-08 NOTE — Progress Notes (Signed)
Loop recorder 

## 2014-07-14 ENCOUNTER — Encounter: Payer: Self-pay | Admitting: Internal Medicine

## 2014-07-15 ENCOUNTER — Encounter: Payer: Self-pay | Admitting: Internal Medicine

## 2014-07-29 LAB — MDC_IDC_ENUM_SESS_TYPE_REMOTE: Date Time Interrogation Session: 20160328040500

## 2014-08-02 NOTE — H&P (Signed)
PATIENT NAME:  [name], [name] MR#:  [id number] DATE OF BIRTH:  1961-03-17  DATE OF ADMISSION:  10/15/2013  PRIMARY CARE PHYSICIAN:  Brent Sizer, MD  REQUESTING PHYSICIAN:  Sheran Fava. Quale, MD  CHIEF COMPLAINT: Dizziness and chest pain.    HISTORY OF PRESENT ILLNESS: The patient is a 54 year old male with no significant medical problems, has been feeling dizzy for the last 3 to 4 weeks. Started having chest pain and shortness of breath this morning, went to his primary care physician where he was found to have supraventricular tachycardia with a heart rate up to 280s and was requested to come to the Emergency Department. While in the ED, he is feeling okay. He is not having any chest pain or trouble breathing. He reports his chest more of midsternal and epigastric area. He does report having passing out spell, presyncope about 2 weeks ago from feeling extremely dizzy. In the ED, he is back in a normal sinus rhythm, rate is well controlled. He is being admitted for further evaluation and management.   PAST MEDICAL HISTORY:  None.   ALLERGIES: AMOXICILLIN.   MEDICATIONS AT HOME:  Flomax 0.4 mg p.o. daily.   SOCIAL HISTORY: No smoking. No alcohol. He works as a Merchandiser, retail.   FAMILY HISTORY: Extensive cardiac history, great-grandfather died of heart attack in mid 33s, uncle in mid 70s from the same.   REVIEW OF SYSTEMS:    CONSTITUTIONAL: No fever, fatigue, weakness.  EYES: No blurred or double vision.  ENT: No tinnitus, ear pain.  RESPIRATORY: No cough, wheezing, hemoptysis.  CARDIOVASCULAR: Positive for chest pain, shortness of breath and some dizziness. Also cardiac arrhythmias.  GASTROINTESTINAL: No nausea, vomiting, diarrhea.  GENITOURINARY:  No dysuria or hematuria.  ENDOCRINE: No polyuria or nocturia.  HEMATOLOGY:  No anemia or easy bruising. SKIN: No rash or lesion.  MUSCULOSKELETAL: No arthritis or muscle cramp.  NEUROLOGICAL:  Positive for dizziness. No tingling or numbness.   PSYCHIATRIC: No history of anxiety or depression.   PHYSICAL EXAMINATION: VITAL SIGNS:  Temperature 98, heart rate 70 per minute, respirations 18 per minute, blood pressure 130/76 mmHg, saturating 98% on room air.  GENERAL: The patient is a 54 year old male lying in the bed comfortably without any acute distress.  EYES: Pupils equal, round, reactive to light and accommodation. No scleral icterus. Extraocular muscles intact.  HEENT: Head atraumatic, normocephalic. Oropharynx and nasopharynx clear.  NECK: Supple. No jugular venous distention. No thyroid enlargement or tenderness.  LUNGS: Clear to auscultation bilaterally. No wheezing, rales, rhonchi or crepitation.  CARDIOVASCULAR:  S1 and S2 normal.  No murmurs, rubs or gallop.  ABDOMEN: Soft, nontender, nondistended. Bowel sounds present. No organomegaly or mass.  EXTREMITIES: No pedal edema, cyanosis, clubbing.  NEUROLOGIC: Cranial nerves II through XII intact.  Muscle strength 5 out of 5 in all extremities. Sensation intact.  PSYCHIATRIC: The patient is alert and oriented x 3.  SKIN: No obvious rash, lesion or ulcer.  MUSCULOSKELETAL: No joint effusion or tenderness.   LABORATORY, DIAGNOSTIC AND RADIOLOGICAL DATA: 1.  Normal BMP. Normal liver function tests. Normal CBC. Normal first set of troponin and cardiac enzymes.  2.  EKG showed normal sinus rhythm here in the Emergency Department.  3.  Chest x-ray showed no acute cardiopulmonary disease.   IMPRESSION AND PLAN:   1.  Symptomatic cardiac arrhythmias, likely from supraventricular tachycardia but can rule out other atrial arrythmias like Atrial Flutter. Will monitor him on telemetry as he was not feeling comfortable going home.  The ED physician tried to discharge but he would not feel comfortable and his family was also insisting on getting at least an observation. We will put him on telemetry, get cardiology consultation, obtain 2-D echocardiogram. Start him on metoprolol at this time.  Will trend serial troponins. He is back in a normal sinus rhythm at this time.  2.  Chest pain. We will rule out with serial troponins. Doubt cardiac in origin although he does have extensive cardiac history. We will obtain cardiology consultation.   TOTAL TIME TAKING CARE OF THIS PATIENT: 35 minutes.     ____________________________ Ellamae Sia. Sherryll Burger, MD vss:cs D: 10/15/2013 19:43:40 ET T: 10/15/2013 20:06:35 ET JOB#: [id number]  cc: Krisandra Bueno S. Sherryll Burger, MD, <Dictator> Brent Sizer, MD  Ellamae Sia [facility] MD ELECTRONICALLY SIGNED 10/16/2013 13:14

## 2014-08-02 NOTE — Discharge Summary (Signed)
PATIENT NAME:  [name], [name] MR#:  [id number] DATE OF BIRTH:  29-Oct-1960  DATE OF ADMISSION:  10/15/2013 DATE OF DISCHARGE:  10/16/2013   The patient will be getting transferred to [facility].   CHIEF COMPLAINT: Dizziness, chest pain.   PRIMARY CARE PHYSICIAN: Steele Sizer, MD  DISCHARGE DIAGNOSES:  1. Tachycardia, with a heart rate of about 280, likely flutter with 1:1.  2. History of syncope recently.   CURRENT MEDICATIONS:  1. Metoprolol 25 mg b.i.d. 2. Tylenol p.r.n. 3. Zofran p.r.n.   DISPOSITION: Transfer to [facility] for EP ablation of flutter.   SIGNIFICANT LABORATORIES AND IMAGING: Troponins negative x3. TSH 0.998. Initial white count of 6.9, hemoglobin 15.6, platelets 220. BUN 9, creatinine 0.9, sodium 138, potassium 3.7. X-ray of the chest: No active cardiopulmonary disease.   HISTORY OF PRESENT ILLNESS AND HOSPITAL COURSE: For full details of H and P, please see the dictation on July 7 by Dr. Sherryll Burger, but briefly, this is a pleasant 54 year old with no significant past medical history, who comes in feeling dizzy for the past 3 or 4 weeks and started to have some chest pain the morning of admission. Went to PCP, where they did an EKG showing heart rate of 280. This was thought to be SVT initially. The patient came into the hospital feeling okay. He was admitted to telemetry and ruled out for acute coronary syndrome. Here, he was seen by cardiology, Dr. Mariah Milling, as well as Dr. Elease Hashimoto. At this point, cardiology feels strongly that this is flutter with 1:1 conduction, and he has had syncope recently with symptoms as well. Now at this point, our cardiologist discussed the case with EP cardiology at [facility], who had accepted the patient for transfer for a possible ablation tomorrow. At this point, the patient has normal TSH, ruled out for acute coronary syndrome, is without any chest pains.   PHYSICAL EXAMINATION:  VITAL SIGNS: On the day of discharge, temperature is 97.8, pulse  rate 67, respiratory rate 18, blood pressure 116/72, O2 saturation 98%.  GENERAL: The patient is a well-developed male, ambulating without difficulty.  HEENT: Normocephalic, atraumatic.  LUNGS: Clear.  ABDOMEN: Soft, nontender, nondistended.  CARDIOVASCULAR: Normal S1, S2.  EXTREMITIES: No edema.  TOTAL TIME SPENT: About 32 minutes.   CODE STATUS: The patient is full code.   ____________________________ Brent Eaton, MD sa:lb D: 10/16/2013 11:22:41 ET T: 10/16/2013 11:39:36 ET JOB#: [id number]  cc: Brent Eaton, MD, <Dictator> Steele Sizer, MD Antonieta Iba, MD Brent Eaton MD ELECTRONICALLY SIGNED 10/18/2013 17:17

## 2014-08-02 NOTE — Consult Note (Signed)
General Aspect 54 year old male with no significant PMH presents to the ER from Dr. Durenda Age office with sx of dizzy and EKG concerning for SVT, rate 280 bpm.  He reports episodes of lightheadedness dating back many years, worse recently. Episode of syncope 2 weeks ago while sitting lasting a second or two. Witnessed by his wife. Worsening lightheadedness for the last 3 to 4 weeks. This AM, he started having chest pain and shortness of breath, went to his primary care physician where he was found to have a narrow complex rhythm,  heart rate up to 280.    Interestingly while in the PMDs office, he denies any significant SOB, disphoresis  While in the ED, he is feeling okay. He is not having any chest pain or trouble breathing. He reports his chest more of midsternal and epigastric area. He does report having passing out spell, presyncope about 2 weeks ago from feeling extremely dizzy. In the ED, he is back in a normal sinus rhythm, rate is well controlled. He is being admitted for further evaluation and management.   PAST MEDICAL HISTORY:  None.   ALLERGIES: AMOXICILLIN.   MEDICATIONS AT HOME:  Flomax 0.4 mg p.o. daily.   SOCIAL HISTORY: No smoking. No alcohol. He works as a Librarian, academic.   FAMILY HISTORY: Extensive cardiac history, great-grandfather died of heart attack in mid 97s, uncle in mid 70s from the same.   Physical Exam:  GEN well developed, well nourished, no acute distress   HEENT hearing intact to voice, moist oral mucosa   NECK supple   RESP normal resp effort  clear BS   CARD Regular rate and rhythm  No murmur   ABD denies tenderness  soft   LYMPH negative neck   EXTR negative edema   SKIN normal to palpation   NEURO motor/sensory function intact   PSYCH alert, A+O to time, place, person, good insight   Review of Systems:  Subjective/Chief Complaint lightheaded, chest pain   General: tired   Skin: No Complaints   ENT: No Complaints   Eyes: No  Complaints   Neck: No Complaints   Respiratory: No Complaints   Cardiovascular: Chest pain or discomfort   Gastrointestinal: No Complaints   Genitourinary: No Complaints   Vascular: No Complaints   Musculoskeletal: No Complaints   Neurologic: Dizzness  syncope   Hematologic: No Complaints   Endocrine: No Complaints   Psychiatric: No Complaints   Review of Systems: All other systems were reviewed and found to be negative   Medications/Allergies Reviewed Medications/Allergies reviewed     Prostatitis:        Admit Diagnosis:   SUPRAVENTRICULAR TACHYCARDIA: Onset Date: 15-Oct-2013, Status: Active, Description: SUPRAVENTRICULAR TACHYCARDIA  Home Medications: Medication Instructions Status  Flomax 0.4 mg oral capsule 1 cap(s) orally once a day (at bedtime) Active   Lab Results:  Hepatic:  07-Jul-15 15:38   Bilirubin, Total 0.6  Alkaline Phosphatase 79 (45-117 NOTE: New Reference Range 03/01/13)  SGPT (ALT) 17  SGOT (AST)  14  Total Protein, Serum 6.9  Albumin, Serum 3.6  Routine Chem:  07-Jul-15 15:38   Glucose, Serum 88  BUN 9  Creatinine (comp) 0.90  Sodium, Serum 138  Potassium, Serum 3.7  Chloride, Serum 103  CO2, Serum 30  Calcium (Total), Serum 8.9  Anion Gap  5  Osmolality (calc) 274  eGFR (African American) >60  eGFR (Non-African American) >60 (eGFR values <32m/min/1.73 m2 may be an indication of chronic kidney disease (CKD). Calculated eGFR is  useful in patients with stable renal function. The eGFR calculation will not be reliable in acutely ill patients when serum creatinine is changing rapidly. It is not useful in  patients on dialysis. The eGFR calculation may not be applicable to patients at the low and high extremes of body sizes, pregnant women, and vegetarians.)  Magnesium, Serum 1.9 (1.8-2.4 THERAPEUTIC RANGE: 4-7 mg/dL TOXIC: > 10 mg/dL  -----------------------)  Cardiac:  07-Jul-15 15:38   Troponin I < 0.02 (0.00-0.05 0.05  ng/mL or less: NEGATIVE  Repeat testing in 3-6 hrs  if clinically indicated. >0.05 ng/mL: POTENTIAL  MYOCARDIAL INJURY. Repeat  testing in 3-6 hrs if  clinically indicated. NOTE: An increase or decrease  of 30% or more on serial  testing suggests a  clinically important change)  CK, Total 73 (39-308 NOTE: NEW REFERENCE RANGE  05/13/2013)  CPK-MB, Serum 1.3 (Result(s) reported on 15 Oct 2013 at 04:18PM.)  Routine Hem:  07-Jul-15 15:38   WBC (CBC) 6.9  RBC (CBC) 5.30  Hemoglobin (CBC) 15.6  Hematocrit (CBC) 47.4  Platelet Count (CBC) 220 (Result(s) reported on 15 Oct 2013 at 04:08PM.)  MCV 89  MCH 29.4  MCHC 32.9  RDW 13.1   EKG:  Interpretation EKG shows narrow complex rhythm, rate 280 bpm Follow up ekg with NSR with no significant ST or T wave changes   Radiology Results: XRay:    07-Jul-15 16:06, Chest Portable Single View  Chest Portable Single View   REASON FOR EXAM:    Chest Pain  COMMENTS:       PROCEDURE: DXR - DXR PORTABLE CHEST SINGLE VIEW  - Oct 15 2013  4:06PM     CLINICAL DATA:  Tachycardia    EXAM:  PORTABLE CHEST - 1 VIEW    COMPARISON:  None.    FINDINGS:  The lungs are well-expanded and clear. The heart and mediastinal  structures are normal. There is no pleural effusion or pneumothorax.  The observed portions of the bony thorax are unremarkable.     IMPRESSION:  There is no active cardiopulmonary disease.      Electronically Signed    By: David  Martinique    On: 10/15/2013 16:22         Verified By: DAVID A. Martinique, M.D., MD    Amoxicillin: Rash  Vital Signs/Nurse's Notes: **Vital Signs.:   07-Jul-15 20:00  Vital Signs Type Routine  Temperature Temperature (F) 97.6  Celsius 36.4  Temperature Source oral  Pulse Pulse 68  Respirations Respirations 15  Systolic BP Systolic BP 124  Diastolic BP (mmHg) Diastolic BP (mmHg) 81  Mean BP 95  Pulse Ox % Pulse Ox % 97  Pulse Ox Activity Level  At rest  Oxygen Delivery Room Air/ 21 %     Impression 54 year old male with no significant PMH presents to the ER from Dr. Durenda Age office with sx of dizzy and EKG concerning for SVT, rate 280 bpm.  1) Arrhythmia EKG is concerning for narrow complex tachycardia, possible SVT interestingly he did not have significant sx while in this rhythm at his PMDs office (per the patient) He did not really appreciate when the rhythm started or terminated. --Would follwo echo (pending) he is on tele.  If no arrhythmia, would consider D/c on 48 hour monitor could also orderr a 30 day ,monitor from the office --Could provide diltiazem 30 to 60 mg pills to take PRN for documented tachycardia (discussed monitoring his heart rate at home)  2) Lightheadedness: Etiology unclear though  concerning for arrhythmia Plan as above  3) Chest pain: some of the sx are atypical in nature can not exclude patient having chest pain in the setting of arrhythmia would monitor cardiac enz he exercises frequently with no anginal sx with exertion --If sx persist as an outpt not associated wiuth arrhythmia, could consider outpt stress test  4) Syncope: two weeks ago, did not seek medical attention  etiology unclear possibly from arrhythmia.  5) Hyperlipidemia cholesterol elevated per the patient follow up with PMD   Electronic Signatures: Ida Rogue (MD)  (Signed 08-Jul-15 07:46)  Authored: General Aspect/Present Illness, History and Physical Exam, Review of System, Past Medical History, Health Issues, Home Medications, Labs, EKG , Radiology, Allergies, Vital Signs/Nurse's Notes, Impression/Plan   Last Updated: 08-Jul-15 07:46 by Ida Rogue (MD)

## 2014-08-06 ENCOUNTER — Encounter: Payer: Self-pay | Admitting: Internal Medicine

## 2014-08-06 ENCOUNTER — Ambulatory Visit (INDEPENDENT_AMBULATORY_CARE_PROVIDER_SITE_OTHER): Payer: BLUE CROSS/BLUE SHIELD | Admitting: *Deleted

## 2014-08-06 DIAGNOSIS — R55 Syncope and collapse: Secondary | ICD-10-CM

## 2014-08-07 NOTE — Progress Notes (Signed)
Loop recorder 

## 2014-08-08 ENCOUNTER — Encounter: Payer: Self-pay | Admitting: Internal Medicine

## 2014-08-11 ENCOUNTER — Encounter: Payer: Self-pay | Admitting: Internal Medicine

## 2014-08-12 ENCOUNTER — Encounter: Payer: Self-pay | Admitting: Internal Medicine

## 2014-09-03 ENCOUNTER — Encounter: Payer: Self-pay | Admitting: Internal Medicine

## 2014-09-05 ENCOUNTER — Encounter: Payer: Self-pay | Admitting: Internal Medicine

## 2014-09-05 ENCOUNTER — Ambulatory Visit (INDEPENDENT_AMBULATORY_CARE_PROVIDER_SITE_OTHER): Payer: BLUE CROSS/BLUE SHIELD | Admitting: *Deleted

## 2014-09-05 DIAGNOSIS — R55 Syncope and collapse: Secondary | ICD-10-CM

## 2014-09-05 NOTE — Progress Notes (Signed)
Loop recorder 

## 2014-09-08 LAB — CUP PACEART REMOTE DEVICE CHECK
MDC IDC SESS DTM: 20160427193101
MDC IDC SET ZONE DETECTION INTERVAL: 2000 ms
MDC IDC SET ZONE DETECTION INTERVAL: 340 ms
Zone Setting Detection Interval: 3000 ms

## 2014-09-16 ENCOUNTER — Ambulatory Visit (INDEPENDENT_AMBULATORY_CARE_PROVIDER_SITE_OTHER): Payer: BLUE CROSS/BLUE SHIELD | Admitting: Internal Medicine

## 2014-09-16 ENCOUNTER — Encounter: Payer: Self-pay | Admitting: Internal Medicine

## 2014-09-16 VITALS — BP 112/84 | HR 72 | Ht 75.0 in | Wt 220.0 lb

## 2014-09-16 DIAGNOSIS — R079 Chest pain, unspecified: Secondary | ICD-10-CM

## 2014-09-16 DIAGNOSIS — R0602 Shortness of breath: Secondary | ICD-10-CM

## 2014-09-16 LAB — CUP PACEART REMOTE DEVICE CHECK
MDC IDC SESS DTM: 20160527001740
MDC IDC SET ZONE DETECTION INTERVAL: 340 ms
Zone Setting Detection Interval: 2000 ms
Zone Setting Detection Interval: 3000 ms

## 2014-09-16 NOTE — Patient Instructions (Signed)
Medication Instructions: - no changes  Labwork: - none  Procedures/Testing: - Your physician has requested that you have an exercise tolerance test- Dr. Graciela Husbands wants to be here while performed (please put on his schedule as well).     1) Do not eat/ drink any thing for 3 hours prior to your test    2) you may take your medications the day of your test.  - Your physician has requested that you have an echocardiogram. Echocardiography is a painless test that uses sound waves to create images of your heart. It provides your doctor with information about the size and shape of your heart and how well your heart's chambers and valves are working. This procedure takes approximately one hour. There are no restrictions for this procedure.  Follow-Up: - pending results of the treadmill test.  Any Additional Special Instructions Will Be Listed Below (If Applicable).    Exercise Stress Electrocardiogram An exercise stress electrocardiogram is a test that is done to evaluate the blood supply to your heart. This test may also be called exercise stress electrocardiography. The test is done while you are walking on a treadmill. The goal of this test is to raise your heart rate. This test is done to find areas of poor blood flow to the heart by determining the extent of coronary artery disease (CAD).   CAD is defined as narrowing in one or more heart (coronary) arteries of more than 70%. If you have an abnormal test result, this may mean that you are not getting adequate blood flow to your heart during exercise. Additional testing may be needed to understand why your test was abnormal. LET [facility] CARE PROVIDER KNOW ABOUT:   Any allergies you have.  All medicines you are taking, including vitamins, herbs, eye drops, creams, and over-the-counter medicines.  Previous problems you or members of your family have had with the use of anesthetics.  Any blood disorders you have.  Previous surgeries you  have had.  Medical conditions you have.  Possibility of pregnancy, if this applies. RISKS AND COMPLICATIONS Generally, this is a safe procedure. However, as with any procedure, complications can occur. Possible complications can include:  Pain or pressure in the following areas:  Chest.  Jaw or neck.  Between your shoulder blades.  Radiating down your left arm.  Dizziness or light-headedness.  Shortness of breath.  Increased or irregular heartbeats.  Nausea or vomiting.  Heart attack (rare). BEFORE THE PROCEDURE  Avoid all forms of caffeine 24 hours before your test or as directed by your health care provider. This includes coffee, tea (even decaffeinated tea), caffeinated sodas, chocolate, cocoa, and certain pain medicines.  Follow your health care provider's instructions regarding eating and drinking before the test.  Take your medicines as directed at regular times with water unless instructed otherwise. Exceptions may include:  If you have diabetes, ask how you are to take your insulin or pills. It is common to adjust insulin dosing the morning of the test.  If you are taking beta-blocker medicines, it is important to talk to your health care provider about these medicines well before the date of your test. Taking beta-blocker medicines may interfere with the test. In some cases, these medicines need to be changed or stopped 24 hours or more before the test.  If you wear a nitroglycerin patch, it may need to be removed prior to the test. Ask your health care provider if the patch should be removed before the test.  If you use an inhaler for any breathing condition, bring it with you to the test.  If you are an outpatient, bring a snack so you can eat right after the stress phase of the test.  Do not smoke for 4 hours prior to the test or as directed by your health care provider.  Do not apply lotions, powders, creams, or oils on your chest prior to the test.  Wear  loose-fitting clothes and comfortable shoes for the test. This test involves walking on a treadmill. PROCEDURE  Multiple patches (electrodes) will be put on your chest. If needed, small areas of your chest may have to be shaved to get better contact with the electrodes. Once the electrodes are attached to your body, multiple wires will be attached to the electrodes and your heart rate will be monitored.  Your heart will be monitored both at rest and while exercising.  You will walk on a treadmill. The treadmill will be started at a slow pace. The treadmill speed and incline will gradually be increased to raise your heart rate. AFTER THE PROCEDURE  Your heart rate and blood pressure will be monitored after the test.  You may return to your normal schedule including diet, activities, and medicines, unless your health care provider tells you otherwise. Document Released: 03/25/2000 Document Revised: 04/02/2013 Document Reviewed: 12/03/2012 [facility] Patient Information 2015 [place], [place]. This information is not intended to replace advice given to you by your health care provider. Make sure you discuss any questions you have with your health care provider.

## 2014-09-16 NOTE — Progress Notes (Signed)
      Patient Care Team: Steele Sizer, MD as PCP - General (Family Medicine)   HPI  [name] is a 54 y.o. male Seen in followup for an episode of chest pain associated with a "heart rate over 280".  He underwent catheterization demonstrated no obstructive disease; left ventricular function was normal.  We finally obtained recordings and the sweep speed of the recording was erroneous  He is now status post loop recorder implantation-Medtronic Linq.  He has complaints of dyspnea on exertion limiting his work outs and he is also short of breath sometimes walking around work. He has vague unrelated chest discomfort. He has had no recurrent syncope. He denies palpitations and peripheral edema.    Past Medical History  Diagnosis Date  . Syncope 10/16/2013  . Chest pain with high risk for cardiac etiology 10/16/2013  . 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 Fatigue due to sleep pattern disturbance 10/16/2013  . Tachycardia   . Prostatism   . Sinus tachycardia   . Implantable loop recorder     Medtronic Linq  . Nonsustained ventricular tachycardia     Identified on his Linq    Past Surgical History  Procedure Laterality Date  . Loop recorder implant  12-09-13    MDT LINQ implanted by Dr Graciela Husbands for syncope  . Left heart catheterization with coronary angiogram N/A 10/17/2013    Procedure: LEFT HEART CATHETERIZATION WITH CORONARY ANGIOGRAM;  Surgeon: Kathleene Hazel, MD;  Location: [facility] CATH LAB;  Service: Cardiovascular;  Laterality: N/A;  . Loop recorder implant N/A 12/09/2013    Procedure: LOOP RECORDER IMPLANT;  Surgeon: Duke Salvia, MD;  Location: [facility] CATH LAB;  Service: Cardiovascular;  Laterality: N/A;    Current Outpatient Prescriptions  Medication Sig Dispense Refill  . aspirin 81 MG chewable tablet Chew 81 mg by mouth daily.    . tamsulosin (FLOMAX) 0.4 MG CAPS capsule Take 0.4 mg by mouth daily after supper.     No current facility-administered medications for this visit.     Allergies  Allergen Reactions  . Amoxicillin Rash    Review of Systems negative except from HPI and PMH  Physical Exam BP 112/84 mmHg  Pulse 72  Ht 6\' 3"  (1.905 m)  Wt 99.791 kg (220 lb)  BMI 27.50 kg/m2 Well developed and nourished in no acute distress HENT normal Neck supple with JVP-flat Clear Regular rate and rhythm, no murmurs or gallops Abd-soft with active BS No Clubbing cyanosis edema Skin-warm and dry A & Oriented  Grossly normal sensory and motor function   ECG was ordered and demonstrated sinus rhythm at 72 Intervals 14/10/37 Left axis deviation  Assessment and  Plan  Syncope-   Chest pain  Tachypalpitations-mechanism not clearly defined.  Nonsustained ventricular tachycardia  Implantable loop recorder-Medtronic  Dyspnea on exertion  Euvolemic continue current meds   Atrial tachycardia  The patient has dyspnea on exertion which is in quite noticeable for manifested as this. Interestingly, his mental recorder demonstrates frequent nonsustained runs of atrial tachycardia. This could be contributing to his symptoms. We will undertake a treadmill test to see if we can demonstrate a correlation.  We will also do an echocardiogram to look at left ventricular structure and valve structures as this also could be contributing to his dyspnea. In the event that the aforementioned testing is unrevealing I will have him see pulmonary. For right now no medication changes.  We'll obtain a chest x-ray.

## 2014-09-17 ENCOUNTER — Encounter: Payer: Self-pay | Admitting: Internal Medicine

## 2014-09-17 LAB — CUP PACEART INCLINIC DEVICE CHECK: MDC IDC SESS DTM: 20160608151045

## 2014-10-05 ENCOUNTER — Encounter: Payer: Self-pay | Admitting: Internal Medicine

## 2014-10-06 ENCOUNTER — Ambulatory Visit (INDEPENDENT_AMBULATORY_CARE_PROVIDER_SITE_OTHER): Payer: BLUE CROSS/BLUE SHIELD | Admitting: *Deleted

## 2014-10-06 DIAGNOSIS — R55 Syncope and collapse: Secondary | ICD-10-CM

## 2014-10-07 ENCOUNTER — Encounter: Payer: BLUE CROSS/BLUE SHIELD | Admitting: Internal Medicine

## 2014-10-07 ENCOUNTER — Ambulatory Visit (INDEPENDENT_AMBULATORY_CARE_PROVIDER_SITE_OTHER): Payer: BLUE CROSS/BLUE SHIELD | Admitting: Internal Medicine

## 2014-10-07 ENCOUNTER — Other Ambulatory Visit: Payer: BLUE CROSS/BLUE SHIELD

## 2014-10-07 DIAGNOSIS — R079 Chest pain, unspecified: Secondary | ICD-10-CM

## 2014-10-07 DIAGNOSIS — R0602 Shortness of breath: Secondary | ICD-10-CM

## 2014-10-07 LAB — CUP PACEART REMOTE DEVICE CHECK
MDC IDC SESS DTM: 20160626233234
MDC IDC SET ZONE DETECTION INTERVAL: 2000 ms
Zone Setting Detection Interval: 3000 ms
Zone Setting Detection Interval: 340 ms

## 2014-10-08 ENCOUNTER — Ambulatory Visit (INDEPENDENT_AMBULATORY_CARE_PROVIDER_SITE_OTHER): Payer: BLUE CROSS/BLUE SHIELD

## 2014-10-08 ENCOUNTER — Other Ambulatory Visit: Payer: Self-pay

## 2014-10-08 DIAGNOSIS — R0602 Shortness of breath: Secondary | ICD-10-CM | POA: Diagnosis not present

## 2014-10-08 NOTE — Progress Notes (Signed)
Loop recorder 

## 2014-10-16 ENCOUNTER — Encounter: Payer: Self-pay | Admitting: Internal Medicine

## 2014-10-31 ENCOUNTER — Encounter: Payer: Self-pay | Admitting: Internal Medicine

## 2014-11-04 ENCOUNTER — Ambulatory Visit (INDEPENDENT_AMBULATORY_CARE_PROVIDER_SITE_OTHER): Payer: BLUE CROSS/BLUE SHIELD

## 2014-11-04 DIAGNOSIS — R55 Syncope and collapse: Secondary | ICD-10-CM

## 2014-11-06 ENCOUNTER — Encounter: Payer: Self-pay | Admitting: Internal Medicine

## 2014-11-12 NOTE — Progress Notes (Signed)
Loop recorder 

## 2014-11-21 LAB — CUP PACEART REMOTE DEVICE CHECK: Date Time Interrogation Session: 20160812144017

## 2014-11-24 ENCOUNTER — Encounter: Payer: Self-pay | Admitting: Internal Medicine

## 2014-11-25 ENCOUNTER — Encounter: Payer: Self-pay | Admitting: Internal Medicine

## 2014-12-02 ENCOUNTER — Encounter: Payer: Self-pay | Admitting: Internal Medicine

## 2014-12-04 ENCOUNTER — Ambulatory Visit (INDEPENDENT_AMBULATORY_CARE_PROVIDER_SITE_OTHER): Payer: BLUE CROSS/BLUE SHIELD | Admitting: *Deleted

## 2014-12-04 DIAGNOSIS — R55 Syncope and collapse: Secondary | ICD-10-CM | POA: Diagnosis not present

## 2014-12-04 NOTE — Progress Notes (Signed)
Loop recorder 

## 2014-12-08 LAB — CUP PACEART REMOTE DEVICE CHECK: Date Time Interrogation Session: 20160829095411

## 2014-12-11 ENCOUNTER — Encounter: Payer: Self-pay | Admitting: Internal Medicine

## 2014-12-30 ENCOUNTER — Encounter: Payer: Self-pay | Admitting: Internal Medicine

## 2014-12-30 ENCOUNTER — Ambulatory Visit (INDEPENDENT_AMBULATORY_CARE_PROVIDER_SITE_OTHER): Payer: BLUE CROSS/BLUE SHIELD | Admitting: Internal Medicine

## 2014-12-30 VITALS — BP 110/72 | HR 60 | Ht 75.0 in | Wt 210.0 lb

## 2014-12-30 DIAGNOSIS — R0602 Shortness of breath: Secondary | ICD-10-CM | POA: Diagnosis not present

## 2014-12-30 DIAGNOSIS — R5383 Other fatigue: Secondary | ICD-10-CM | POA: Diagnosis not present

## 2014-12-30 LAB — CUP PACEART INCLINIC DEVICE CHECK
MDC IDC SESS DTM: 20160920110253
MDC IDC SET ZONE DETECTION INTERVAL: 2000 ms
Zone Setting Detection Interval: 3000 ms
Zone Setting Detection Interval: 340 ms

## 2014-12-30 NOTE — Patient Instructions (Signed)
Medication Instructions:  Your physician recommends that you continue on your current medications as directed. Please refer to the Current Medication list given to you today.   Labwork: none  Testing/Procedures: none  Follow-Up: Your physician wants you to follow-up in: one year with Dr. Klein.  You will receive a reminder letter in the mail two months in advance. If you don't receive a letter, please call our office to schedule the follow-up appointment.   Any Other Special Instructions Will Be Listed Below (If Applicable).   

## 2014-12-30 NOTE — Progress Notes (Signed)
      Patient Care Team: Steele Sizer, MD as PCP - General (Family Medicine)   HPI  [name] is a 54 y.o. male Seen in followup for an episode of chest pain associated with a "heart rate over 280".  He underwent catheterization demonstrated no obstructive disease; left ventricular function was normal.  We finally obtained recordings and the sweep speed of the recording was erroneous  He is now status post loop recorder implantation-Medtronic Linq.  He has complaints of dyspnea on exertion limiting his work outs and he is also short of breath sometimes walking around work. He has vague unrelated chest discomfort. He has had no recurrent syncope. He denies palpitations and peripheral edema. Echo 7/16  Normal   DATE TEST    7/16 ECHO     EF NORMAL   7/16 ETT NO atrial tachycardia      Past Medical History  Diagnosis Date  . Syncope 10/16/2013  . Chest pain with high risk for cardiac etiology 10/16/2013  . 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 Fatigue due to sleep pattern disturbance 10/16/2013  . Tachycardia   . Prostatism   . Sinus tachycardia   . Implantable loop recorder     Medtronic Linq  . Nonsustained ventricular tachycardia     Identified on his Linq    Past Surgical History  Procedure Laterality Date  . Loop recorder implant  12-09-13    MDT LINQ implanted by Dr Graciela Husbands for syncope  . Left heart catheterization with coronary angiogram N/A 10/17/2013    Procedure: LEFT HEART CATHETERIZATION WITH CORONARY ANGIOGRAM;  Surgeon: Kathleene Hazel, MD;  Location: [facility] CATH LAB;  Service: Cardiovascular;  Laterality: N/A;  . Loop recorder implant N/A 12/09/2013    Procedure: LOOP RECORDER IMPLANT;  Surgeon: Duke Salvia, MD;  Location: [facility] CATH LAB;  Service: Cardiovascular;  Laterality: N/A;    Current Outpatient Prescriptions  Medication Sig Dispense Refill  . aspirin 81 MG chewable tablet Chew 81 mg by mouth daily.    . tamsulosin (FLOMAX) 0.4 MG CAPS capsule Take 0.4 mg by  mouth daily after supper.     No current facility-administered medications for this visit.    Allergies  Allergen Reactions  . Amoxicillin Rash    Review of Systems negative except from HPI and PMH  Physical Exam BP 110/72 mmHg  Pulse 60  Ht 6\' 3"  (1.905 m)  Wt 210 lb (95.255 kg)  BMI 26.25 kg/m2 Well developed and nourished in no acute distress HENT normal Neck supple with JVP-flat Clear Regular rate and rhythm, no murmurs or gallops Abd-soft with active BS No Clubbing cyanosis edema Skin-warm and dry A & Oriented  Grossly normal sensory and motor function   ECG was ordered and demonstrated sinus rhythm at 68 Interval 15/10/40 Axis left -42   Assessment and  Plan  Syncope-   Chest pain  Tachypalpitations-mechanism not clearly defined.  Nonsustained ventricular tachycardia none detected  Implantable loop recorder-Medtronic  Dyspnea on exertion  Euvolemic continue current meds  Atrial tachycardia   Still with some dyspnea but overall remains stable.  No evidence of volume overload.  Treadmill failed to identify any rhythmic limitation. Interestingly, his exercise tolerance was much less than I would've anticipated.

## 2014-12-31 ENCOUNTER — Encounter: Payer: Self-pay | Admitting: Internal Medicine

## 2015-01-02 ENCOUNTER — Encounter: Payer: Self-pay | Admitting: Internal Medicine

## 2015-01-02 ENCOUNTER — Ambulatory Visit (INDEPENDENT_AMBULATORY_CARE_PROVIDER_SITE_OTHER): Payer: BLUE CROSS/BLUE SHIELD | Admitting: *Deleted

## 2015-01-02 DIAGNOSIS — R55 Syncope and collapse: Secondary | ICD-10-CM | POA: Diagnosis not present

## 2015-01-08 NOTE — Progress Notes (Signed)
Loop recorder 

## 2015-01-27 ENCOUNTER — Encounter: Payer: Self-pay | Admitting: Internal Medicine

## 2015-02-02 ENCOUNTER — Ambulatory Visit (INDEPENDENT_AMBULATORY_CARE_PROVIDER_SITE_OTHER): Payer: BLUE CROSS/BLUE SHIELD | Admitting: *Deleted

## 2015-02-02 DIAGNOSIS — R55 Syncope and collapse: Secondary | ICD-10-CM | POA: Diagnosis not present

## 2015-02-02 LAB — CUP PACEART REMOTE DEVICE CHECK: MDC IDC SESS DTM: 20160923195120

## 2015-02-02 NOTE — Progress Notes (Signed)
Carelink summary report received. Battery status OK. Normal device function. No new symptom, brady, pause, or AF episodes. 1 tachy episode--ECG shows oversensing/artifact. Monthly summary reports and ROV with SK in 12/2015 in [place].

## 2015-02-03 NOTE — Progress Notes (Signed)
Loop recorder 

## 2015-02-24 ENCOUNTER — Encounter: Payer: Self-pay | Admitting: Internal Medicine

## 2015-03-02 ENCOUNTER — Telehealth: Payer: Self-pay | Admitting: *Deleted

## 2015-03-02 NOTE — Telephone Encounter (Signed)
[facility] requesting call back.  Patient has AF episode in progress on LINQ transmission from 03/01/15.  Per Dr. Graciela Husbands, set patient up with appointment in AF clinic to discuss [facility] before pursuing cardioversion.  Will make patient aware when he returns call.

## 2015-03-03 NOTE — Telephone Encounter (Signed)
Patient returned phone call.  Advised patient about the AF episode on his Carelink transmission that lasted 9 hrs 16 min.  Patient denies any cardiac symptoms, but states he has had left elbow pain with movement/exercise for about two weeks.  Advised patient to follow-up with his PCP for the elbow pain.  Patient agrees to send a manual Carelink transmission for review tonight when he gets home from work around [employer].  Advised patient that I will call him tomorrow to update him on the plan after I have received and reviewed his manual transmission.  Patient is appreciative of call and denies additional questions or concerns at this time.  Since patient converted to sinus rhythm without intervention, will confirm with Dr. Graciela Husbands whether he would still like patient to follow-up with the AF clinic to discuss OAC.

## 2015-03-03 NOTE — Telephone Encounter (Signed)
Follow Up ° °Pt returned call//  °

## 2015-03-04 ENCOUNTER — Ambulatory Visit (INDEPENDENT_AMBULATORY_CARE_PROVIDER_SITE_OTHER): Payer: BLUE CROSS/BLUE SHIELD | Admitting: *Deleted

## 2015-03-04 DIAGNOSIS — R55 Syncope and collapse: Secondary | ICD-10-CM | POA: Diagnosis not present

## 2015-03-04 NOTE — Telephone Encounter (Signed)
Follow Up ° °4. Are you calling to see if we received your device transmission? Yes  ° ° ° °

## 2015-03-04 NOTE — Telephone Encounter (Signed)
Called patient to let him know that his manual transmission was successfully received.  Advised him that Dr. Graciela Husbands reviewed the AF episode and advised that we continue to monitor for the time being.  Patient is agreeable to this plan.  He states that he has had brief ("a few seconds") sharp chest pains, rating 5/10, in left to medial chest off and on since this morning.  He states that they happen regardless of activity.  Spoke with Dr. Elberta Fortis, DOD, who advised that this is likely not cardiac.  Relayed advice to patient, and advised that if his chest pain/discomfort worsens or persists that he should seek emergency services as our office will be closed for the holiday.  Patient verbalizes understanding of instructions and denies any questions or concerns at this time.

## 2015-03-04 NOTE — Progress Notes (Signed)
Carelink Summary Report / Loop Recorder 

## 2015-03-06 LAB — CUP PACEART REMOTE DEVICE CHECK: Date Time Interrogation Session: 20161024143529

## 2015-03-06 NOTE — Progress Notes (Signed)
Carelink summary report received. Battery status OK. Normal device function. No new symptom, brady, pause, or AF episodes. 1 tachy episode--artifact. Monthly summary reports and ROV with SK in [place] in 12/2015.

## 2015-03-12 ENCOUNTER — Encounter: Payer: Self-pay | Admitting: Internal Medicine

## 2015-04-03 ENCOUNTER — Ambulatory Visit (INDEPENDENT_AMBULATORY_CARE_PROVIDER_SITE_OTHER): Payer: BLUE CROSS/BLUE SHIELD | Admitting: *Deleted

## 2015-04-03 DIAGNOSIS — R55 Syncope and collapse: Secondary | ICD-10-CM | POA: Diagnosis not present

## 2015-04-03 NOTE — Progress Notes (Signed)
Carelink Summary Report / Loop Recorder 

## 2015-04-18 LAB — CUP PACEART REMOTE DEVICE CHECK: Date Time Interrogation Session: 20161123143655

## 2015-05-04 ENCOUNTER — Ambulatory Visit (INDEPENDENT_AMBULATORY_CARE_PROVIDER_SITE_OTHER): Payer: BLUE CROSS/BLUE SHIELD | Admitting: *Deleted

## 2015-05-04 DIAGNOSIS — R55 Syncope and collapse: Secondary | ICD-10-CM | POA: Diagnosis not present

## 2015-05-05 NOTE — Progress Notes (Signed)
Carelink Summary Report / Loop Recorder 

## 2015-05-16 LAB — CUP PACEART REMOTE DEVICE CHECK: Date Time Interrogation Session: 20161224031546

## 2015-05-26 ENCOUNTER — Emergency Department
Admission: EM | Admit: 2015-05-26 | Discharge: 2015-05-26 | Disposition: A | Discharge status: Home / Self Care | Payer: Worker's Compensation | Attending: Emergency Medicine | Admitting: Emergency Medicine

## 2015-05-26 ENCOUNTER — Emergency Department: Payer: Worker's Compensation

## 2015-05-26 ENCOUNTER — Encounter: Payer: Self-pay | Admitting: Emergency Medicine

## 2015-05-26 DIAGNOSIS — Y9389 Activity, other specified: Secondary | ICD-10-CM | POA: Insufficient documentation

## 2015-05-26 DIAGNOSIS — S060X0A Concussion without loss of consciousness, initial encounter: Secondary | ICD-10-CM | POA: Diagnosis not present

## 2015-05-26 DIAGNOSIS — S0990XA Unspecified injury of head, initial encounter: Secondary | ICD-10-CM | POA: Diagnosis present

## 2015-05-26 DIAGNOSIS — Z880 Allergy status to penicillin: Secondary | ICD-10-CM | POA: Diagnosis not present

## 2015-05-26 DIAGNOSIS — W228XXA Striking against or struck by other objects, initial encounter: Secondary | ICD-10-CM | POA: Diagnosis not present

## 2015-05-26 DIAGNOSIS — Z7982 Long term (current) use of aspirin: Secondary | ICD-10-CM | POA: Diagnosis not present

## 2015-05-26 DIAGNOSIS — Y9289 Other specified places as the place of occurrence of the external cause: Secondary | ICD-10-CM | POA: Diagnosis not present

## 2015-05-26 DIAGNOSIS — Y990 Civilian activity done for income or pay: Secondary | ICD-10-CM | POA: Insufficient documentation

## 2015-05-26 DIAGNOSIS — Z79899 Other long term (current) drug therapy: Secondary | ICD-10-CM | POA: Insufficient documentation

## 2015-05-26 MED ORDER — BUTALBITAL-APAP-CAFFEINE 50-325-40 MG PO TABS: 1.0000 | ORAL_TABLET | Freq: Four times a day (QID) | ORAL | Status: DC | PRN

## 2015-05-26 MED ORDER — BUTALBITAL-APAP-CAFFEINE 50-325-40 MG PO TABS
2.0000 | ORAL_TABLET | Freq: Once | ORAL | Status: AC
  Administered 2015-05-26: 2 via ORAL
  Filled 2015-05-26 (×2): qty 2

## 2015-05-26 MED ORDER — PROMETHAZINE HCL 25 MG PO TABS: 25.0000 mg | ORAL_TABLET | Freq: Four times a day (QID) | ORAL | Status: DC | PRN

## 2015-05-26 MED ORDER — ONDANSETRON 4 MG PO TBDP
4.0000 mg | ORAL_TABLET | Freq: Once | ORAL | Status: AC
  Administered 2015-05-26: 4 mg via ORAL
  Filled 2015-05-26: qty 1

## 2015-05-26 NOTE — ED Notes (Signed)
Pt to ed with c/o headache after hitting his head on a box at work this am.  Pt states he was sent to urgent care first and then sent here for eval. Pt reports headache, difficulty concentrating since injury.

## 2015-05-26 NOTE — ED Provider Notes (Signed)
[facility] Emergency Department Provider Note  Time seen: 5:35 PM  I have reviewed the triage vital signs and the nursing notes.   HISTORY  Chief Complaint Headache    HPI [name] is a 55 y.o. male with a past medical history of syncope, presents to the emergency department after a head injury. According to the patient he hit his head on the box at work this morning around 9 AM. Patient states he felt very lightheaded and almost like he was going to pass out, states significant headache at the time. Denies loss of consciousness, did feel nauseated but that resolved. Throughout the day the patient states he has continued to feel sluggish and foggy. His nausea returned so his work sent him to the urgent care who sent him to the emergency department for further evaluation. Patient denies any vomiting. Denies any loss of consciousness.     Past Medical History  Diagnosis Date  . Syncope 10/16/2013  . Chest pain with high risk for cardiac etiology 10/16/2013  . 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 Fatigue due to sleep pattern disturbance 10/16/2013  . Tachycardia   . Prostatism   . Sinus tachycardia (HCC)   . Implantable loop recorder     Medtronic Linq  . Nonsustained ventricular tachycardia (HCC)     Identified on his Linq    Patient Active Problem List   Diagnosis Date Noted  . Sinus tachycardia (HCC) 10/16/2013  . Chest pain with high risk for cardiac etiology 10/16/2013  . Syncope 10/16/2013  . Fatigue due to sleep pattern disturbance 10/16/2013  . 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 Chest pain 10/16/2013    Past Surgical History  Procedure Laterality Date  . Loop recorder implant  12-09-13    MDT LINQ implanted by Dr Graciela Husbands for syncope  . Left heart catheterization with coronary angiogram N/A 10/17/2013    Procedure: LEFT HEART CATHETERIZATION WITH CORONARY ANGIOGRAM;  Surgeon: Kathleene Hazel, MD;  Location: [facility] CATH LAB;  Service: Cardiovascular;  Laterality: N/A;   . Loop recorder implant N/A 12/09/2013    Procedure: LOOP RECORDER IMPLANT;  Surgeon: Duke Salvia, MD;  Location: [facility] CATH LAB;  Service: Cardiovascular;  Laterality: N/A;    Current Outpatient Rx  Name  Route  Sig  Dispense  Refill  . aspirin 81 MG chewable tablet   Oral   Chew 81 mg by mouth daily.         . tamsulosin (FLOMAX) 0.4 MG CAPS capsule   Oral   Take 0.4 mg by mouth daily after supper.           Allergies Amoxicillin  Family History  Problem Relation Age of Onset  . Sudden death Maternal Grandfather 44  . Hypertension Mother   . Arrhythmia Mother     Social History Social History  Substance Use Topics  . Smoking status: Never Smoker   . Smokeless tobacco: Never Used  . Alcohol Use: No    Review of Systems Constitutional: Negative for fever. Cardiovascular: Negative for chest pain. Respiratory: Negative for shortness of breath. Gastrointestinal: Negative for abdominal pain Neurological: Moderate headache. Denies focal weakness or numbness. 10-point ROS otherwise negative.  ____________________________________________   PHYSICAL EXAM:  VITAL SIGNS: ED Triage Vitals  Enc Vitals Group     BP 05/26/15 1522 133/95 mmHg     Pulse Rate 05/26/15 1522 65     Resp 05/26/15 1522 20     Temp 05/26/15 1522 97.9 F (36.6 C)  Temp Source 05/26/15 1522 Oral     SpO2 05/26/15 1522 98 %     Weight 05/26/15 1522 216 lb (97.977 kg)     Height 05/26/15 1522  (1.905 m)     Head Cir --      Peak Flow --      Pain Score 05/26/15 1529 10     Pain Loc --      Pain Edu? --      Excl. in GC? --     Constitutional: Alert and oriented. Well appearing and in no distress. Eyes: Normal exam ENT   Head: Normocephalic and atraumatic.   Mouth/Throat: Mucous membranes are moist. Cardiovascular: Normal rate, regular rhythm. No murmur Respiratory: Normal respiratory effort without tachypnea nor retractions. Breath sounds are clear Gastrointestinal:  Soft and nontender. No distention. Musculoskeletal: Nontender with normal range of motion in all extremities.  Neurologic:  Normal speech and language. No gross focal neurologic deficits. Equal grip strengths. No pronator drift. Skin:  Skin is warm, dry and intact.  Psychiatric: Mood and affect are normal. Speech and behavior are normal.   ____________________________________________   RADIOLOGY  CT shows no acute abnormality  INITIAL IMPRESSION / ASSESSMENT AND PLAN / ED COURSE  Pertinent labs & imaging results that were available during my care of the patient were reviewed by me and considered in my medical decision making (see chart for details).  Patient presents to the emergency department after a head injury. Patient's CT is negative. Suspect likely mild concussion. We'll discharge with Fioricet and Phenergan as needed for nausea. The patient is agreeable to plan. I discussed 24 hours of mental rest, and primary care follow-up if the patient does not feel improved.  ____________________________________________   FINAL CLINICAL IMPRESSION(S) / ED DIAGNOSES  Closed head injury Concussion   Minna Antis, MD 05/26/15 [phone]

## 2015-05-26 NOTE — ED Notes (Signed)
Patient transported to CT 

## 2015-05-26 NOTE — ED Notes (Signed)
Pt states he hit a metal box at work, now states nausea and a headache, denies any LOC, pt awake and alert in no acute distress

## 2015-05-26 NOTE — Discharge Instructions (Signed)
Concussion, Adult  A concussion, or closed-head injury, is a brain injury caused by a direct blow to the head or by a quick and sudden movement (jolt) of the head or neck. Concussions are usually not life-threatening. Even so, the effects of a concussion can be serious. If you have had a concussion before, you are more likely to experience concussion-like symptoms after a direct blow to the head.   CAUSES  · Direct blow to the head, such as from running into another player during a soccer game, being hit in a fight, or hitting your head on a hard surface.  · A jolt of the head or neck that causes the brain to move back and forth inside the skull, such as in a car crash.  SIGNS AND SYMPTOMS  The signs of a concussion can be hard to notice. Early on, they may be missed by you, family members, and health care providers. You may look fine but act or feel differently.  Symptoms are usually temporary, but they may last for days, weeks, or even longer. Some symptoms may appear right away while others may not show up for hours or days. Every head injury is different. Symptoms include:  · Mild to moderate headaches that will not go away.  · A feeling of pressure inside your head.  · Having more trouble than usual:    Learning or remembering things you have heard.    Answering questions.    Paying attention or concentrating.    Organizing daily tasks.    Making decisions and solving problems.  · Slowness in thinking, acting or reacting, speaking, or reading.  · Getting lost or being easily confused.  · Feeling tired all the time or lacking energy (fatigued).  · Feeling drowsy.  · Sleep disturbances.    Sleeping more than usual.    Sleeping less than usual.    Trouble falling asleep.    Trouble sleeping (insomnia).  · Loss of balance or feeling lightheaded or dizzy.  · Nausea or vomiting.  · Numbness or tingling.  · Increased sensitivity to:    Sounds.    Lights.    Distractions.  · Vision problems or eyes that tire  easily.  · Diminished sense of taste or smell.  · Ringing in the ears.  · Mood changes such as feeling sad or anxious.  · Becoming easily irritated or angry for little or no reason.  · Lack of motivation.  · Seeing or hearing things other people do not see or hear (hallucinations).  DIAGNOSIS  Your health care provider can usually diagnose a concussion based on a description of your injury and symptoms. He or she will ask whether you passed out (lost consciousness) and whether you are having trouble remembering events that happened right before and during your injury.  Your evaluation might include:  · A brain scan to look for signs of injury to the brain. Even if the test shows no injury, you may still have a concussion.  · Blood tests to be sure other problems are not present.  TREATMENT  · Concussions are usually treated in an emergency department, in urgent care, or at a clinic. You may need to stay in the hospital overnight for further treatment.  · Tell your health care provider if you are taking any medicines, including prescription medicines, over-the-counter medicines, and natural remedies. Some medicines, such as blood thinners (anticoagulants) and aspirin, may increase the chance of complications. Also tell your health care   provider whether you have had alcohol or are taking illegal drugs. This information may affect treatment.  · Your health care provider will send you home with important instructions to follow.  · How fast you will recover from a concussion depends on many factors. These factors include how severe your concussion is, what part of your brain was injured, your age, and how healthy you were before the concussion.  · Most people with mild injuries recover fully. Recovery can take time. In general, recovery is slower in older persons. Also, persons who have had a concussion in the past or have other medical problems may find that it takes longer to recover from their current injury.  HOME  CARE INSTRUCTIONS  General Instructions  · Carefully follow the directions your health care provider gave you.  · Only take over-the-counter or prescription medicines for pain, discomfort, or fever as directed by your health care provider.  · Take only those medicines that your health care provider has approved.  · Do not drink alcohol until your health care provider says you are well enough to do so. Alcohol and certain other drugs may slow your recovery and can put you at risk of further injury.  · If it is harder than usual to remember things, write them down.  · If you are easily distracted, try to do one thing at a time. For example, do not try to watch TV while fixing dinner.  · Talk with family members or close friends when making important decisions.  · Keep all follow-up appointments. Repeated evaluation of your symptoms is recommended for your recovery.  · Watch your symptoms and tell others to do the same. Complications sometimes occur after a concussion. Older adults with a brain injury may have a higher risk of serious complications, such as a blood clot on the brain.  · Tell your teachers, school nurse, school counselor, coach, athletic trainer, or work manager about your injury, symptoms, and restrictions. Tell them about what you can or cannot do. They should watch for:    Increased problems with attention or concentration.    Increased difficulty remembering or learning new information.    Increased time needed to complete tasks or assignments.    Increased irritability or decreased ability to cope with stress.    Increased symptoms.  · Rest. Rest helps the brain to heal. Make sure you:    Get plenty of sleep at night. Avoid staying up late at night.    Keep the same bedtime hours on weekends and weekdays.    Rest during the day. Take daytime naps or rest breaks when you feel tired.  · Limit activities that require a lot of thought or concentration. These include:    Doing homework or job-related  work.    Watching TV.    Working on the computer.  · Avoid any situation where there is potential for another head injury (football, hockey, soccer, basketball, martial arts, downhill snow sports and horseback riding). Your condition will get worse every time you experience a concussion. You should avoid these activities until you are evaluated by the appropriate follow-up health care providers.  Returning To Your Regular Activities  You will need to return to your normal activities slowly, not all at once. You must give your body and brain enough time for recovery.  · Do not return to sports or other athletic activities until your health care provider tells you it is safe to do so.  · Ask   your health care provider when you can drive, ride a bicycle, or operate heavy machinery. Your ability to react may be slower after a brain injury. Never do these activities if you are dizzy.  · Ask your health care provider about when you can return to work or school.  Preventing Another Concussion  It is very important to avoid another brain injury, especially before you have recovered. In rare cases, another injury can lead to permanent brain damage, brain swelling, or death. The risk of this is greatest during the first 7-10 days after a head injury. Avoid injuries by:  · Wearing a seat belt when riding in a car.  · Drinking alcohol only in moderation.  · Wearing a helmet when biking, skiing, skateboarding, skating, or doing similar activities.  · Avoiding activities that could lead to a second concussion, such as contact or recreational sports, until your health care provider says it is okay.  · Taking safety measures in your home.    Remove clutter and tripping hazards from floors and stairways.    Use grab bars in bathrooms and handrails by stairs.    Place non-slip mats on floors and in bathtubs.    Improve lighting in dim areas.  SEEK MEDICAL CARE IF:  · You have increased problems paying attention or  concentrating.  · You have increased difficulty remembering or learning new information.  · You need more time to complete tasks or assignments than before.  · You have increased irritability or decreased ability to cope with stress.  · You have more symptoms than before.  Seek medical care if you have any of the following symptoms for more than 2 weeks after your injury:  · Lasting (chronic) headaches.  · Dizziness or balance problems.  · Nausea.  · Vision problems.  · Increased sensitivity to noise or light.  · Depression or mood swings.  · Anxiety or irritability.  · Memory problems.  · Difficulty concentrating or paying attention.  · Sleep problems.  · Feeling tired all the time.  SEEK IMMEDIATE MEDICAL CARE IF:  · You have severe or worsening headaches. These may be a sign of a blood clot in the brain.  · You have weakness (even if only in one hand, leg, or part of the face).  · You have numbness.  · You have decreased coordination.  · You vomit repeatedly.  · You have increased sleepiness.  · One pupil is larger than the other.  · You have convulsions.  · You have slurred speech.  · You have increased confusion. This may be a sign of a blood clot in the brain.  · You have increased restlessness, agitation, or irritability.  · You are unable to recognize people or places.  · You have neck pain.  · It is difficult to wake you up.  · You have unusual behavior changes.  · You lose consciousness.  MAKE SURE YOU:  · Understand these instructions.  · Will watch your condition.  · Will get help right away if you are not doing well or get worse.     This information is not intended to replace advice given to you by your health care provider. Make sure you discuss any questions you have with your health care provider.     Document Released: 06/18/2003 Document Revised: 04/18/2014 Document Reviewed: 10/18/2012  Elsevier Interactive Patient Education ©2016 [employer]

## 2015-05-26 NOTE — ED Notes (Signed)
Pt informed to return if any life threatening symptoms occur.  

## 2015-05-26 NOTE — ED Notes (Signed)
Pt reports that he has already had UDS done for workers comp claim at [place] urgent. Per Charge nurse since urgent care is closed another UDS does not need to be completed.

## 2015-06-02 ENCOUNTER — Ambulatory Visit (INDEPENDENT_AMBULATORY_CARE_PROVIDER_SITE_OTHER): Payer: BLUE CROSS/BLUE SHIELD | Admitting: *Deleted

## 2015-06-02 DIAGNOSIS — R55 Syncope and collapse: Secondary | ICD-10-CM

## 2015-06-03 NOTE — Progress Notes (Signed)
Carelink Summary Report / Loop Recorder 

## 2015-06-15 LAB — CUP PACEART REMOTE DEVICE CHECK: Date Time Interrogation Session: 20170122153610

## 2015-06-15 NOTE — Progress Notes (Signed)
Carelink summary report received. Battery status OK. Normal device function. No new symptom episodes, tachy episodes, brady, or pause episodes. No new AF episodes. Monthly summary reports and ROV/PRN 

## 2015-07-02 ENCOUNTER — Ambulatory Visit (INDEPENDENT_AMBULATORY_CARE_PROVIDER_SITE_OTHER): Payer: BLUE CROSS/BLUE SHIELD | Admitting: *Deleted

## 2015-07-02 DIAGNOSIS — R55 Syncope and collapse: Secondary | ICD-10-CM | POA: Diagnosis not present

## 2015-07-03 NOTE — Progress Notes (Signed)
Carelink Summary Report / Loop Recorder 

## 2015-07-07 ENCOUNTER — Encounter: Payer: Self-pay | Admitting: Internal Medicine

## 2015-08-03 ENCOUNTER — Ambulatory Visit (INDEPENDENT_AMBULATORY_CARE_PROVIDER_SITE_OTHER): Payer: BLUE CROSS/BLUE SHIELD | Admitting: *Deleted

## 2015-08-03 DIAGNOSIS — R55 Syncope and collapse: Secondary | ICD-10-CM | POA: Diagnosis not present

## 2015-08-03 NOTE — Progress Notes (Signed)
Carelink Summary Report / Loop Recorder 

## 2015-08-21 LAB — CUP PACEART REMOTE DEVICE CHECK: Date Time Interrogation Session: 20170221160912

## 2015-08-21 NOTE — Progress Notes (Signed)
Carelink summary report received. Battery status OK. Normal device function. No new symptom episodes,  brady, or pause episodes. No new AF episodes. 1 tachy- previously addressed, appears artifact. Monthly summary reports and ROV/PRN

## 2015-08-31 ENCOUNTER — Ambulatory Visit (INDEPENDENT_AMBULATORY_CARE_PROVIDER_SITE_OTHER): Payer: BLUE CROSS/BLUE SHIELD | Admitting: *Deleted

## 2015-08-31 DIAGNOSIS — R55 Syncope and collapse: Secondary | ICD-10-CM

## 2015-09-01 NOTE — Progress Notes (Signed)
Carelink Summary Report / Loop Recorder 

## 2015-09-05 LAB — CUP PACEART REMOTE DEVICE CHECK: MDC IDC SESS DTM: 20170323163556

## 2015-09-05 NOTE — Progress Notes (Signed)
Carelink summary report received. Battery status OK. Normal device function. No new symptom episodes, brady, or pause episodes. No new AF episodes. 2 tachy episodes noise. Monthly summary reports and ROV/PRN

## 2015-09-07 LAB — CUP PACEART REMOTE DEVICE CHECK: MDC IDC SESS DTM: 20170422171007

## 2015-09-07 NOTE — Progress Notes (Signed)
Carelink summary report received. Battery status OK. Normal device function. No new symptom episodes, brady, or pause episodes. 6.5% AF, new diagnosis.  CHADS2VASC is 0.  V rates elevated. Due SK 12/2015. Monthly summary reports and ROV/PRN

## 2015-09-30 ENCOUNTER — Ambulatory Visit (INDEPENDENT_AMBULATORY_CARE_PROVIDER_SITE_OTHER): Payer: BLUE CROSS/BLUE SHIELD | Admitting: *Deleted

## 2015-09-30 DIAGNOSIS — R55 Syncope and collapse: Secondary | ICD-10-CM

## 2015-09-30 NOTE — Progress Notes (Signed)
Carelink Summary Report / Loop Recorder 

## 2015-10-09 LAB — CUP PACEART REMOTE DEVICE CHECK
Date Time Interrogation Session: 20170522170632
Date Time Interrogation Session: 20170621173812

## 2015-10-30 ENCOUNTER — Ambulatory Visit (INDEPENDENT_AMBULATORY_CARE_PROVIDER_SITE_OTHER): Payer: BLUE CROSS/BLUE SHIELD | Admitting: *Deleted

## 2015-10-30 DIAGNOSIS — R55 Syncope and collapse: Secondary | ICD-10-CM

## 2015-10-30 NOTE — Progress Notes (Signed)
Carelink Summary Report / Loop Recorder 

## 2015-11-26 LAB — CUP PACEART REMOTE DEVICE CHECK: Date Time Interrogation Session: 20170721180935

## 2015-11-30 ENCOUNTER — Ambulatory Visit (INDEPENDENT_AMBULATORY_CARE_PROVIDER_SITE_OTHER): Payer: BLUE CROSS/BLUE SHIELD | Admitting: *Deleted

## 2015-11-30 DIAGNOSIS — R55 Syncope and collapse: Secondary | ICD-10-CM

## 2015-11-30 NOTE — Progress Notes (Signed)
Carelink Summary Report / Loop Report 

## 2015-12-15 ENCOUNTER — Encounter: Payer: Self-pay | Admitting: Internal Medicine

## 2015-12-28 LAB — CUP PACEART REMOTE DEVICE CHECK: MDC IDC SESS DTM: 20170820180937

## 2015-12-28 NOTE — Progress Notes (Signed)
Carelink summary report received. Battery status OK. Normal device function. No new symptom episodes, brady, or pause episodes. No new AF episodes. 1 tachy ECG previously addressed, appears artifact. Monthly summary reports and ROV/PRN

## 2015-12-29 ENCOUNTER — Ambulatory Visit (INDEPENDENT_AMBULATORY_CARE_PROVIDER_SITE_OTHER): Payer: BLUE CROSS/BLUE SHIELD | Admitting: *Deleted

## 2015-12-29 DIAGNOSIS — R55 Syncope and collapse: Secondary | ICD-10-CM

## 2015-12-29 NOTE — Progress Notes (Signed)
Carelink Summary Report / Loop Recorder 

## 2016-01-05 ENCOUNTER — Encounter: Payer: Self-pay | Admitting: Internal Medicine

## 2016-01-21 ENCOUNTER — Telehealth: Payer: Self-pay | Admitting: Internal Medicine

## 2016-01-21 NOTE — Telephone Encounter (Signed)
To Dr. Klein to review. 

## 2016-01-21 NOTE — Telephone Encounter (Signed)
Gunnar Fusi from [facility] Calling stating pt needs a filling  It is scheduled Nov 1  Would like to know if this is okay for patient to do He will be using a general anastatic  Please advise

## 2016-01-21 NOTE — Telephone Encounter (Signed)
Should be acceptable risk

## 2016-01-23 LAB — CUP PACEART REMOTE DEVICE CHECK: MDC IDC SESS DTM: 20170919180625

## 2016-01-23 NOTE — Progress Notes (Signed)
Carelink summary report received. Battery status OK. Normal device function. No new symptom episodes, tachy episodes, brady, or pause episodes. No new AF episodes. Monthly summary reports and ROV/PRN 

## 2016-01-25 NOTE — Telephone Encounter (Signed)
I called Dr. Lindie Spruce office and spoke with Gunnar Fusi. I advised that per Dr. Graciela Husbands, that patient should be ok to proceed with his procedure, but will fax Dr. Odessa Fleming response to [phone].

## 2016-01-25 NOTE — Telephone Encounter (Signed)
Phone note faxed to Dr. Lindie Spruce at [phone].  Confirmation received.

## 2016-01-28 ENCOUNTER — Ambulatory Visit (INDEPENDENT_AMBULATORY_CARE_PROVIDER_SITE_OTHER): Payer: BLUE CROSS/BLUE SHIELD | Admitting: *Deleted

## 2016-01-28 DIAGNOSIS — R55 Syncope and collapse: Secondary | ICD-10-CM | POA: Diagnosis not present

## 2016-01-28 NOTE — Progress Notes (Signed)
Carelink Summary Report / Loop Recorder 

## 2016-02-27 LAB — CUP PACEART REMOTE DEVICE CHECK
Date Time Interrogation Session: 20171019181146
Implantable Pulse Generator Implant Date: 20150831

## 2016-02-27 NOTE — Progress Notes (Signed)
Carelink summary report received. Battery status OK. Normal device function. No new symptom episodes, tachy episodes, brady, or pause episodes. No new AF episodes. Monthly summary reports and ROV/PRN 

## 2016-02-29 ENCOUNTER — Ambulatory Visit (INDEPENDENT_AMBULATORY_CARE_PROVIDER_SITE_OTHER): Payer: BLUE CROSS/BLUE SHIELD | Admitting: *Deleted

## 2016-02-29 DIAGNOSIS — R55 Syncope and collapse: Secondary | ICD-10-CM

## 2016-02-29 NOTE — Progress Notes (Signed)
Carelink Summary Report / Loop Recorder 

## 2016-03-01 ENCOUNTER — Ambulatory Visit (INDEPENDENT_AMBULATORY_CARE_PROVIDER_SITE_OTHER): Payer: BLUE CROSS/BLUE SHIELD | Admitting: Internal Medicine

## 2016-03-01 ENCOUNTER — Encounter: Payer: Self-pay | Admitting: Internal Medicine

## 2016-03-01 VITALS — BP 120/72 | HR 70 | Ht 75.0 in | Wt 220.0 lb

## 2016-03-01 DIAGNOSIS — I471 Supraventricular tachycardia: Secondary | ICD-10-CM | POA: Diagnosis not present

## 2016-03-01 DIAGNOSIS — R55 Syncope and collapse: Secondary | ICD-10-CM

## 2016-03-01 DIAGNOSIS — R002 Palpitations: Secondary | ICD-10-CM

## 2016-03-01 NOTE — Patient Instructions (Signed)
Medication Instructions: - Your physician recommends that you continue on your current medications as directed. Please refer to the Current Medication list given to you today.  Labwork: - none ordered  Procedures/Testing: - none ordered  Follow-Up: - Your physician wants you to follow-up in: August 2018 with Dr. Graciela Husbands. You will receive a reminder letter in the mail two months in advance. If you don't receive a letter, please call our office to schedule the follow-up appointment.  Any Additional Special Instructions Will Be Listed Below (If Applicable).     If you need a refill on your cardiac medications before your next appointment, please call your pharmacy.

## 2016-03-01 NOTE — Progress Notes (Signed)
      Patient Care Team: Steele Sizer, MD as PCP - General (Family Medicine)   HPI  [name] is a 55 y.o. male Seen in followup for an episode of chest pain associated with a "heart rate over 280".  7/15 catheterization demonstrated no obstructive disease; left ventricular function was normal.  We finally obtained recordings and the sweep speed of the recording was erroneous  He is now status post loop recorder implantation-Medtronic Linq.  He has complaints of dyspnea on exertion limiting his work outs and he is also short of breath sometimes walking around work. He has vague unrelated chest discomfort. He has had no recurrent syncope. He denies palpitations and peripheral edema.    DATE TEST    7/15 Cath Normal CAs   7/16 ECHO     EF NORMAL   7/16 ETT NO atrial tachycardia      Past Medical History:  Diagnosis Date  . Chest pain with high risk for cardiac etiology 10/16/2013  . Fatigue due to sleep pattern disturbance 10/16/2013  . Implantable loop recorder    Medtronic Linq  . Nonsustained ventricular tachycardia (HCC)    Identified on his Linq  . 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 Prostatism   . Sinus tachycardia   . Syncope 10/16/2013  . Tachycardia     Past Surgical History:  Procedure Laterality Date  . LEFT HEART CATHETERIZATION WITH CORONARY ANGIOGRAM N/A 10/17/2013   Procedure: LEFT HEART CATHETERIZATION WITH CORONARY ANGIOGRAM;  Surgeon: Kathleene Hazel, MD;  Location: [facility] C[facility] LAB;  Service: Cardiovascular;  Laterality: N/A;  . LOOP RECORDER IMPLANT  12-09-13   MDT LINQ implanted by Dr Graciela Husbands for syncope  . LOOP RECORDER IMPLANT N/A 12/09/2013   Procedure: LOOP RECORDER IMPLANT;  Surgeon: Duke Salvia, MD;  Location: [facility] CATH LAB;  Service: Cardiovascular;  Laterality: N/A;    Current Outpatient Prescriptions  Medication Sig Dispense Refill  . aspirin 81 MG chewable tablet Chew 81 mg by mouth daily.    . tamsulosin (FLOMAX) 0.4 MG CAPS capsule Take 0.4 mg by  mouth daily after supper.     No current facility-administered medications for this visit.     Allergies  Allergen Reactions  . Amoxicillin Rash    Review of Systems negative except from HPI and PMH  Physical Exam BP 120/72 (BP Location: Left Arm, Patient Position: Sitting, Cuff Size: Normal)   Pulse 70   Ht 6\' 3"  (1.905 m)   Wt 220 lb (99.8 kg)   BMI 27.50 kg/m  Well developed and nourished in no acute distress HENT normal Neck supple with JVP-flat Clear Regular rate and rhythm, no murmurs or gallops Abd-soft with active BS No Clubbing cyanosis edema Skin-warm and dry A & Oriented  Grossly normal sensory and motor function   ECG was ordered and demonstrated sinus rhythm at 68 Interval 15/10/40 Axis left -42   Assessment and  Plan  Syncope-   Chest pain-atyppical  Tachypalpitations-mechanism not clearly defined.  Nonsustained ventricular tachycardia none detected  Implantable loop recorder-Medtronic  Dyspnea on exertion  Euvolemic continue current meds  Atrial tachycardia   Still with some dyspnea but overall remains stable.  No evidence of volume overload.  Treadmill failed to identify any rhythmic limitation. Interestingly, his exercise tolerance was much less than I would've anticipated.

## 2016-03-02 ENCOUNTER — Encounter: Payer: Self-pay | Admitting: Family Medicine

## 2016-03-02 ENCOUNTER — Ambulatory Visit (INDEPENDENT_AMBULATORY_CARE_PROVIDER_SITE_OTHER): Payer: BLUE CROSS/BLUE SHIELD | Admitting: Family Medicine

## 2016-03-02 VITALS — BP 128/84 | HR 69 | Temp 98.3°F | Ht 74.6 in | Wt 221.8 lb

## 2016-03-02 DIAGNOSIS — R399 Unspecified symptoms and signs involving the genitourinary system: Secondary | ICD-10-CM | POA: Diagnosis not present

## 2016-03-02 DIAGNOSIS — N419 Inflammatory disease of prostate, unspecified: Secondary | ICD-10-CM | POA: Diagnosis not present

## 2016-03-02 MED ORDER — CIPROFLOXACIN HCL 500 MG PO TABS: 500.0000 mg | ORAL_TABLET | Freq: Two times a day (BID) | ORAL | 0 refills | Status: DC

## 2016-03-02 MED ORDER — TAMSULOSIN HCL 0.4 MG PO CAPS: 0.4000 mg | ORAL_CAPSULE | Freq: Every day | ORAL | 12 refills | Status: DC

## 2016-03-02 NOTE — Progress Notes (Signed)
BP 128/84 (BP Location: Left Arm, Patient Position: Sitting, Cuff Size: Large)   Pulse 69   Temp 98.3 F (36.8 C)   Ht 6' 2.6" (1.895 m)   Wt 221 lb 12.8 oz (100.6 kg)   SpO2 98%   BMI 28.02 kg/m    Subjective:    Patient ID: [name], male    DOB: 1961/03/07, 55 y.o.   MRN: [id number]  HPI: [name] is a 55 y.o. male  Chief Complaint  Patient presents with  . Prostate Problem    pt states he is having a prostate problem, states his semen has been a brown color lately and states he had a burning sensation constantly. States he had flomax in the past but has not taken in a while.   Patient presents with burning pain in pelvis that is worse with sitting down x 1 week, brown semen x 3 days. Hx of prostatitis and BPH. Was on flomax and doing well, but has not been in since 2015 and ran out of refills. Having difficulties with stream and urinary hesitancy at times. Denies fever, dysuria, hematuria, low back pain, N/V.   Past Medical History:  Diagnosis Date  . Chest pain with high risk for cardiac etiology 10/16/2013  . Fatigue due to sleep pattern disturbance 10/16/2013  . Implantable loop recorder    Medtronic Linq  . Nonsustained ventricular tachycardia (HCC)    Identified on his Linq  . 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 Prostatism   . Sinus tachycardia   . Syncope 10/16/2013  . Tachycardia    Social History   Social History  . Marital status: Widowed    Spouse name: N/A  . Number of children: N/A  . Years of education: N/A   Occupational History  . Not on file.   Social History Main Topics  . Smoking status: Never Smoker  . Smokeless tobacco: Never Used  . Alcohol use No  . Drug use: No  . Sexual activity: Yes   Other Topics Concern  . Not on file   Social History Narrative  . No narrative on file    Relevant past medical, surgical, family and social history reviewed and updated as indicated. Interim medical history since our last visit reviewed. Allergies and  medications reviewed and updated.  Review of Systems  Constitutional: Negative.   HENT: Negative.   Respiratory: Negative.   Cardiovascular: Negative.   Gastrointestinal: Negative.   Musculoskeletal: Negative.   Neurological: Negative.   Psychiatric/Behavioral: Negative.     Per HPI unless specifically indicated above     Objective:    BP 128/84 (BP Location: Left Arm, Patient Position: Sitting, Cuff Size: Large)   Pulse 69   Temp 98.3 F (36.8 C)   Ht 6' 2.6" (1.895 m)   Wt 221 lb 12.8 oz (100.6 kg)   SpO2 98%   BMI 28.02 kg/m   Wt Readings from Last 3 Encounters:  03/02/16 221 lb 12.8 oz (100.6 kg)  03/01/16 220 lb (99.8 kg)  05/26/15 216 lb (98 kg)    Physical Exam  Constitutional: He is oriented to person, place, and time. He appears well-developed and well-nourished.  HENT:  Head: Atraumatic.  Eyes: Conjunctivae are normal. No scleral icterus.  Neck: Normal range of motion. Neck supple.  Cardiovascular: Normal rate.   Pulmonary/Chest: Effort normal. No respiratory distress.  Genitourinary: Rectum normal.  Genitourinary Comments: Prostate ttp, no nodules noted  Musculoskeletal: Normal range of motion.  Neurological: He is  alert and oriented to person, place, and time.  Skin: Skin is warm and dry.  Psychiatric: He has a normal mood and affect. His behavior is normal.  Nursing note and vitals reviewed.   Results for orders placed or performed in visit on 01/28/16  Implantable device - remote  Result Value Ref Range   Date Time Interrogation Session [id number]    Pulse Generator Manufacturer MERM    Pulse Gen Model IRJ18 Reveal LINQ    Pulse Gen Serial Number [id number]    Clinic Name [facility]    Implantable Pulse Generator Type ICM/ILR    Implantable Pulse Generator Implant Date [id number]    Eval Rhythm SR       Assessment & Plan:   Problem List Items Addressed This Visit    None    Visit Diagnoses    Prostatitis, unspecified  prostatitis type    -  Primary   Will treat with cipro x 6 weeks. If no improvement, will refer to Urology   Lower urinary tract symptoms (LUTS)       Will restart flomax.    Relevant Medications   tamsulosin (FLOMAX) 0.4 MG CAPS capsule   Other Relevant Orders   UA/M w/rflx Culture, Routine       Follow up plan: Return for Physical Exam.

## 2016-03-02 NOTE — Patient Instructions (Signed)
Follow up as needed

## 2016-03-03 LAB — UA/M W/RFLX CULTURE, ROUTINE
BILIRUBIN UA: NEGATIVE
GLUCOSE, UA: NEGATIVE
KETONES UA: NEGATIVE
Leukocytes, UA: NEGATIVE
NITRITE UA: NEGATIVE
Protein, UA: NEGATIVE
SPEC GRAV UA: 1.025 (ref 1.005–1.030)
Urobilinogen, Ur: 0.2 mg/dL (ref 0.2–1.0)
pH, UA: 6 (ref 5.0–7.5)

## 2016-03-03 LAB — MICROSCOPIC EXAMINATION: WBC, UA: NONE SEEN /hpf (ref 0–?)

## 2016-03-10 ENCOUNTER — Encounter: Payer: Self-pay | Admitting: Internal Medicine

## 2016-03-23 ENCOUNTER — Encounter: Payer: BLUE CROSS/BLUE SHIELD | Admitting: Family Medicine

## 2016-03-28 ENCOUNTER — Ambulatory Visit (INDEPENDENT_AMBULATORY_CARE_PROVIDER_SITE_OTHER): Payer: BLUE CROSS/BLUE SHIELD | Admitting: *Deleted

## 2016-03-28 DIAGNOSIS — R55 Syncope and collapse: Secondary | ICD-10-CM

## 2016-03-29 NOTE — Progress Notes (Signed)
Carelink Summary Report / Loop Recorder 

## 2016-04-10 LAB — CUP PACEART REMOTE DEVICE CHECK
Date Time Interrogation Session: 20171118190658
MDC IDC PG IMPLANT DT: 20150831

## 2016-04-10 NOTE — Progress Notes (Signed)
Carelink summary report received. Battery status OK. Normal device function. No new symptom episodes, tachy episodes, brady, or pause episodes. No new AF episodes. Monthly summary reports and ROV/PRN 

## 2016-04-27 ENCOUNTER — Ambulatory Visit (INDEPENDENT_AMBULATORY_CARE_PROVIDER_SITE_OTHER): Payer: BLUE CROSS/BLUE SHIELD | Admitting: *Deleted

## 2016-04-27 DIAGNOSIS — R55 Syncope and collapse: Secondary | ICD-10-CM | POA: Diagnosis not present

## 2016-04-28 LAB — CUP PACEART INCLINIC DEVICE CHECK
MDC IDC PG IMPLANT DT: 20150831
MDC IDC SESS DTM: 20171121140520

## 2016-04-29 NOTE — Progress Notes (Signed)
Carelink Summary Report / Loop Recorder 

## 2016-05-16 LAB — CUP PACEART REMOTE DEVICE CHECK
Implantable Pulse Generator Implant Date: 20150831
MDC IDC SESS DTM: 20171218191025

## 2016-05-27 ENCOUNTER — Ambulatory Visit (INDEPENDENT_AMBULATORY_CARE_PROVIDER_SITE_OTHER): Payer: BLUE CROSS/BLUE SHIELD | Admitting: *Deleted

## 2016-05-27 DIAGNOSIS — R55 Syncope and collapse: Secondary | ICD-10-CM

## 2016-05-30 NOTE — Progress Notes (Signed)
Carelink Summary Report / Loop Recorder 

## 2016-06-04 LAB — CUP PACEART REMOTE DEVICE CHECK
Date Time Interrogation Session: 20180117193739
MDC IDC PG IMPLANT DT: 20150831

## 2016-06-15 LAB — CUP PACEART REMOTE DEVICE CHECK
Implantable Pulse Generator Implant Date: 20150831
MDC IDC SESS DTM: 20180216194222

## 2016-06-27 ENCOUNTER — Ambulatory Visit (INDEPENDENT_AMBULATORY_CARE_PROVIDER_SITE_OTHER): Payer: BLUE CROSS/BLUE SHIELD | Admitting: *Deleted

## 2016-06-27 DIAGNOSIS — R55 Syncope and collapse: Secondary | ICD-10-CM | POA: Diagnosis not present

## 2016-06-27 NOTE — Progress Notes (Signed)
Carelink Summary Report / Loop Recorder 

## 2016-06-30 ENCOUNTER — Encounter: Payer: BLUE CROSS/BLUE SHIELD | Admitting: Family Medicine

## 2016-07-11 LAB — CUP PACEART REMOTE DEVICE CHECK
Implantable Pulse Generator Implant Date: 20150831
MDC IDC SESS DTM: 20180318201002

## 2016-07-26 ENCOUNTER — Ambulatory Visit (INDEPENDENT_AMBULATORY_CARE_PROVIDER_SITE_OTHER): Payer: BLUE CROSS/BLUE SHIELD | Admitting: *Deleted

## 2016-07-26 DIAGNOSIS — R55 Syncope and collapse: Secondary | ICD-10-CM | POA: Diagnosis not present

## 2016-07-27 NOTE — Progress Notes (Signed)
Carelink Summary Report / Loop Recorder 

## 2016-08-04 ENCOUNTER — Encounter: Payer: Self-pay | Admitting: Internal Medicine

## 2016-08-12 LAB — CUP PACEART REMOTE DEVICE CHECK
Date Time Interrogation Session: 20180417203913
MDC IDC PG IMPLANT DT: 20150831

## 2016-08-25 ENCOUNTER — Ambulatory Visit (INDEPENDENT_AMBULATORY_CARE_PROVIDER_SITE_OTHER): Payer: BLUE CROSS/BLUE SHIELD | Admitting: *Deleted

## 2016-08-25 DIAGNOSIS — R55 Syncope and collapse: Secondary | ICD-10-CM

## 2016-08-26 NOTE — Progress Notes (Signed)
Carelink Summary Report / Loop Recorder 

## 2016-09-05 LAB — CUP PACEART REMOTE DEVICE CHECK
Implantable Pulse Generator Implant Date: 20150831
MDC IDC SESS DTM: 20180517214203

## 2016-09-26 ENCOUNTER — Ambulatory Visit (INDEPENDENT_AMBULATORY_CARE_PROVIDER_SITE_OTHER): Payer: BLUE CROSS/BLUE SHIELD | Admitting: *Deleted

## 2016-09-26 DIAGNOSIS — R55 Syncope and collapse: Secondary | ICD-10-CM

## 2016-09-26 NOTE — Progress Notes (Signed)
Carelink Summary Report / Loop Recorder 

## 2016-10-04 LAB — CUP PACEART REMOTE DEVICE CHECK
Implantable Pulse Generator Implant Date: 20150831
MDC IDC SESS DTM: 20180616215033

## 2016-10-21 ENCOUNTER — Encounter: Payer: Self-pay | Admitting: Family Medicine

## 2016-10-24 ENCOUNTER — Ambulatory Visit (INDEPENDENT_AMBULATORY_CARE_PROVIDER_SITE_OTHER): Payer: BLUE CROSS/BLUE SHIELD | Admitting: *Deleted

## 2016-10-24 DIAGNOSIS — R55 Syncope and collapse: Secondary | ICD-10-CM | POA: Diagnosis not present

## 2016-10-25 NOTE — Progress Notes (Signed)
Carelink Summary Report / Loop Recorder 

## 2016-10-31 LAB — CUP PACEART REMOTE DEVICE CHECK
Implantable Pulse Generator Implant Date: 20150831
MDC IDC SESS DTM: 20180716220926

## 2016-10-31 NOTE — Progress Notes (Signed)
Carelink summary report received. Battery status OK. Normal device function. No new symptom episodes,  brady, or pause episodes. No new AF episodes. 1 tachy- ECG appears burst SVT, no symptoms reported per EPIC. Monthly summary reports and ROV/PRN

## 2016-11-03 ENCOUNTER — Telehealth: Payer: Self-pay | Admitting: Cardiology

## 2016-11-03 NOTE — Telephone Encounter (Signed)
Attempted to call pt because his home monitor has not updated in at least 14 days. Call was disconnected.

## 2016-11-07 ENCOUNTER — Telehealth: Payer: Self-pay | Admitting: *Deleted

## 2016-11-07 NOTE — Telephone Encounter (Signed)
LMOVM requesting call back to [facility].  Will discuss any symptoms with pause episode on 10/24/16 at 2048, duration 5sec.  Episode did not transmit via Carelink until 11/05/16.

## 2016-11-09 NOTE — Telephone Encounter (Signed)
LMOVM (DPR) requesting call back to the Device Clinic regarding episode noted on LINQ.  Gave direct phone number.

## 2016-11-14 ENCOUNTER — Encounter: Payer: BLUE CROSS/BLUE SHIELD | Admitting: Family Medicine

## 2016-11-14 NOTE — Telephone Encounter (Signed)
LMOVM (DPR) requesting call back to the Device Clinic.

## 2016-11-21 ENCOUNTER — Encounter: Payer: BLUE CROSS/BLUE SHIELD | Admitting: Family Medicine

## 2016-11-23 ENCOUNTER — Ambulatory Visit (INDEPENDENT_AMBULATORY_CARE_PROVIDER_SITE_OTHER): Payer: BLUE CROSS/BLUE SHIELD | Admitting: *Deleted

## 2016-11-23 DIAGNOSIS — R000 Tachycardia, unspecified: Secondary | ICD-10-CM

## 2016-11-29 LAB — CUP PACEART REMOTE DEVICE CHECK
Implantable Pulse Generator Implant Date: 20150831
MDC IDC SESS DTM: 20180815224333

## 2016-11-29 NOTE — Progress Notes (Signed)
Carelink summary report received. Battery status OK. Normal device function. No new symptom episodes, tachy episodes, or brady episodes. No new AF episodes. 1 pause- 5 sec's, device clinic has left multiple messages for pt to assess symptoms. Monthly summary reports and ROV/PRN

## 2016-12-01 NOTE — Telephone Encounter (Signed)
Encounter closed as three attempts were made to reach patient regarding any symptoms during episode.

## 2016-12-23 ENCOUNTER — Ambulatory Visit (INDEPENDENT_AMBULATORY_CARE_PROVIDER_SITE_OTHER): Payer: BLUE CROSS/BLUE SHIELD | Admitting: *Deleted

## 2016-12-23 DIAGNOSIS — R55 Syncope and collapse: Secondary | ICD-10-CM

## 2016-12-26 NOTE — Progress Notes (Signed)
Carelink Summary Report / Loop Recorder 

## 2016-12-27 LAB — CUP PACEART REMOTE DEVICE CHECK
MDC IDC PG IMPLANT DT: 20150831
MDC IDC SESS DTM: 20180914224046

## 2017-01-23 ENCOUNTER — Ambulatory Visit (INDEPENDENT_AMBULATORY_CARE_PROVIDER_SITE_OTHER): Payer: BLUE CROSS/BLUE SHIELD | Admitting: *Deleted

## 2017-01-23 DIAGNOSIS — R55 Syncope and collapse: Secondary | ICD-10-CM | POA: Diagnosis not present

## 2017-01-23 NOTE — Progress Notes (Signed)
Carelink Summary Report / Loop Recorder 

## 2017-01-25 LAB — CUP PACEART REMOTE DEVICE CHECK
Date Time Interrogation Session: 20181014233940
MDC IDC PG IMPLANT DT: 20150831

## 2017-01-31 ENCOUNTER — Encounter: Payer: BLUE CROSS/BLUE SHIELD | Admitting: Family Medicine

## 2017-02-08 ENCOUNTER — Encounter: Payer: Self-pay | Admitting: Internal Medicine

## 2017-02-09 ENCOUNTER — Telehealth: Payer: Self-pay | Admitting: *Deleted

## 2017-02-09 NOTE — Telephone Encounter (Signed)
LMOVM regarding LINQ at [facility] since Oct. 29th, 2018 and past due for Follow-up with Dr. Graciela Husbands. Gave [facility] phone number to call back to discuss next steps and schedule follow-up.

## 2017-02-14 ENCOUNTER — Other Ambulatory Visit: Payer: Self-pay | Admitting: Internal Medicine

## 2017-02-16 NOTE — Telephone Encounter (Signed)
LMOVM requesting call back to the Device [facility], gave direct number.

## 2017-02-21 ENCOUNTER — Ambulatory Visit (INDEPENDENT_AMBULATORY_CARE_PROVIDER_SITE_OTHER): Payer: BLUE CROSS/BLUE SHIELD | Admitting: *Deleted

## 2017-02-21 DIAGNOSIS — R55 Syncope and collapse: Secondary | ICD-10-CM | POA: Diagnosis not present

## 2017-02-22 NOTE — Progress Notes (Signed)
Carelink Summary Report / Loop Recorder 

## 2017-02-24 ENCOUNTER — Other Ambulatory Visit: Payer: Self-pay | Admitting: Family Medicine

## 2017-02-24 DIAGNOSIS — R399 Unspecified symptoms and signs involving the genitourinary system: Secondary | ICD-10-CM

## 2017-02-27 ENCOUNTER — Encounter: Payer: BLUE CROSS/BLUE SHIELD | Admitting: Family Medicine

## 2017-03-08 NOTE — Telephone Encounter (Signed)
LMOVM (DPR) requesting call back to the [facility] regarding LINQ at [facility].  Also advised that patient is due to see Dr. Graciela Husbands.  Gave direct number.

## 2017-03-10 LAB — CUP PACEART REMOTE DEVICE CHECK
Implantable Pulse Generator Implant Date: 20150831
MDC IDC SESS DTM: 20181114003912

## 2017-03-16 ENCOUNTER — Encounter: Payer: BLUE CROSS/BLUE SHIELD | Admitting: Family Medicine

## 2017-03-23 ENCOUNTER — Encounter: Payer: Self-pay | Admitting: *Deleted

## 2017-03-23 ENCOUNTER — Encounter: Payer: BLUE CROSS/BLUE SHIELD | Admitting: *Deleted

## 2017-03-23 NOTE — Telephone Encounter (Signed)
Letter sent via MyChart requesting that patient contact the [facility] regarding his loop recorder.  Direct number given.  Unenrolled from [place], return kit ordered to home address on file.

## 2017-04-17 ENCOUNTER — Encounter: Payer: BLUE CROSS/BLUE SHIELD | Admitting: Family Medicine

## 2017-05-09 ENCOUNTER — Encounter: Payer: BLUE CROSS/BLUE SHIELD | Admitting: Family Medicine

## 2017-07-13 ENCOUNTER — Other Ambulatory Visit: Payer: Self-pay | Admitting: Internal Medicine

## 2018-02-25 ENCOUNTER — Other Ambulatory Visit: Payer: Self-pay | Admitting: Family Medicine

## 2018-02-25 DIAGNOSIS — R399 Unspecified symptoms and signs involving the genitourinary system: Secondary | ICD-10-CM

## 2018-02-26 NOTE — Telephone Encounter (Signed)
Requested medication (s) are due for refill today: Yes  Requested medication (s) are on the active medication list: Yes  Last refill:  02/24/17  Future visit scheduled: No  Notes to clinic:  Unable to refill, expired medication, no OV in last 12 months     Requested Prescriptions  Pending Prescriptions Disp Refills   tamsulosin (FLOMAX) 0.4 MG CAPS capsule [Pharmacy Med Name: TAMSULOSIN HCL 0.4 MG CAP] 30 capsule 12    Sig: TAKE 1 CAPSULE BY MOUTH ONCE DAILY.     Urology: Alpha-Adrenergic Blocker Failed - 02/26/2018  4:44 PM      Failed - Valid encounter within last 12 months    Recent Outpatient Visits          1 year ago Prostatitis, unspecified prostatitis type   [facility] [place], [place]             Passed - Last BP in normal range    BP Readings from Last 1 Encounters:  03/02/16 128/84

## 2018-02-26 NOTE — Telephone Encounter (Signed)
Patient called, left VM to return call to schedule a physical in order to receive medication refills.

## 2020-01-10 DIAGNOSIS — J1282 Pneumonia due to coronavirus disease 2019: Secondary | ICD-10-CM

## 2020-01-10 DIAGNOSIS — Z8616 Personal history of COVID-19: Secondary | ICD-10-CM

## 2020-01-10 DIAGNOSIS — U071 COVID-19: Secondary | ICD-10-CM

## 2020-01-10 HISTORY — DX: Pneumonia due to coronavirus disease 2019: J12.82

## 2020-01-10 HISTORY — DX: COVID-19: U07.1

## 2020-01-10 HISTORY — DX: Personal history of COVID-19: Z86.16

## 2020-01-24 DIAGNOSIS — U071 COVID-19: Secondary | ICD-10-CM | POA: Diagnosis not present

## 2020-01-24 DIAGNOSIS — R059 Cough, unspecified: Secondary | ICD-10-CM | POA: Diagnosis not present

## 2020-01-24 DIAGNOSIS — R439 Unspecified disturbances of smell and taste: Secondary | ICD-10-CM | POA: Diagnosis not present

## 2020-01-24 DIAGNOSIS — R630 Anorexia: Secondary | ICD-10-CM | POA: Diagnosis not present

## 2020-01-24 DIAGNOSIS — R0981 Nasal congestion: Secondary | ICD-10-CM | POA: Diagnosis not present

## 2020-01-24 DIAGNOSIS — Z880 Allergy status to penicillin: Secondary | ICD-10-CM | POA: Diagnosis not present

## 2020-01-24 DIAGNOSIS — J302 Other seasonal allergic rhinitis: Secondary | ICD-10-CM | POA: Diagnosis not present

## 2020-01-24 DIAGNOSIS — R0602 Shortness of breath: Secondary | ICD-10-CM | POA: Diagnosis not present

## 2020-01-24 DIAGNOSIS — R918 Other nonspecific abnormal finding of lung field: Secondary | ICD-10-CM | POA: Diagnosis not present

## 2020-01-24 DIAGNOSIS — J811 Chronic pulmonary edema: Secondary | ICD-10-CM | POA: Diagnosis not present

## 2020-01-25 DIAGNOSIS — U071 COVID-19: Secondary | ICD-10-CM | POA: Diagnosis not present

## 2020-01-25 DIAGNOSIS — J811 Chronic pulmonary edema: Secondary | ICD-10-CM | POA: Diagnosis not present

## 2020-01-27 ENCOUNTER — Other Ambulatory Visit (HOSPITAL_COMMUNITY): Payer: Self-pay | Admitting: Physician Assistant

## 2020-01-27 ENCOUNTER — Encounter: Payer: Self-pay | Admitting: Physician Assistant

## 2020-01-27 NOTE — Progress Notes (Signed)
I connected by phone with [name] on 01/27/2020 at 6:32 PM to discuss the potential use of a new treatment for mild to moderate COVID-19 viral infection in non-hospitalized patients.  This patient is a 59 y.o. male that meets the FDA criteria for Emergency Use Authorization of COVID monoclonal antibody casirivimab/imdevimab or bamlanivimab/eteseviamb.  Has a (+) direct SARS-CoV-2 viral test result  Has mild or moderate COVID-19   Is NOT hospitalized due to COVID-19  Is within 10 days of symptom onset  Has at least one of the high risk factor(s) for progression to severe COVID-19 and/or hospitalization as defined in EUA.  Specific high risk criteria : BMI > 25 and Cardiovascular disease or hypertension   I have spoken and communicated the following to the patient or parent/caregiver regarding COVID monoclonal antibody treatment:  1. FDA has authorized the emergency use for the treatment of mild to moderate COVID-19 in adults and pediatric patients with positive results of direct SARS-CoV-2 viral testing who are 69 years of age and older weighing at least 40 kg, and who are at high risk for progressing to severe COVID-19 and/or hospitalization.  2. The significant known and potential risks and benefits of COVID monoclonal antibody, and the extent to which such potential risks and benefits are unknown.  3. Information on available alternative treatments and the risks and benefits of those alternatives, including clinical trials.  4. Patients treated with COVID monoclonal antibody should continue to self-isolate and use infection control measures (e.g., wear mask, isolate, social distance, avoid sharing personal items, clean and disinfect "high touch" surfaces, and frequent handwashing) according to CDC guidelines.   5. The patient or parent/caregiver has the option to accept or refuse COVID monoclonal antibody treatment.  After reviewing this information with the patient, the patient  has agreed to receive one of the available covid 19 monoclonal antibodies and will be provided an appropriate fact sheet prior to infusion. Jodell Cipro, PA-C 01/27/2020 6:32 PM

## 2020-01-28 ENCOUNTER — Ambulatory Visit (HOSPITAL_COMMUNITY)
Admission: RE | Admit: 2020-01-28 | Discharge: 2020-01-28 | Disposition: A | Discharge status: Home / Self Care | Payer: BC Managed Care – PPO | Source: Ambulatory Visit | Attending: Pulmonary Disease | Admitting: Pulmonary Disease

## 2020-01-28 DIAGNOSIS — U071 COVID-19: Secondary | ICD-10-CM | POA: Insufficient documentation

## 2020-01-28 MED ORDER — EPINEPHRINE 0.3 MG/0.3ML IJ SOAJ: 0.3000 mg | Freq: Once | INTRAMUSCULAR | Status: DC | PRN

## 2020-01-28 MED ORDER — SODIUM CHLORIDE 0.9 % IV SOLN: INTRAVENOUS | Status: DC | PRN

## 2020-01-28 MED ORDER — DIPHENHYDRAMINE HCL 50 MG/ML IJ SOLN: 50.0000 mg | Freq: Once | INTRAMUSCULAR | Status: DC | PRN

## 2020-01-28 MED ORDER — FAMOTIDINE IN NACL 20-0.9 MG/50ML-% IV SOLN: 20.0000 mg | Freq: Once | INTRAVENOUS | Status: DC | PRN

## 2020-01-28 MED ORDER — METHYLPREDNISOLONE SODIUM SUCC 125 MG IJ SOLR: 125.0000 mg | Freq: Once | INTRAMUSCULAR | Status: DC | PRN

## 2020-01-28 MED ORDER — SODIUM CHLORIDE 0.9 % IV SOLN: Freq: Once | INTRAVENOUS | Status: AC

## 2020-01-28 MED ORDER — ALBUTEROL SULFATE HFA 108 (90 BASE) MCG/ACT IN AERS: 2.0000 | INHALATION_SPRAY | Freq: Once | RESPIRATORY_TRACT | Status: DC | PRN

## 2020-01-28 NOTE — Progress Notes (Signed)
  Diagnosis: COVID-19  Physician: Dr. Wright  Procedure: Cov[facility] Infusion Clinic Med: bamlanivimab\etesevimab infusion - Provided patient with bamlanimivab\etesevimab fact sheet for patients, parents and caregivers prior to infusion.  Complications: No immediate complications noted.  Discharge: Discharged home   [name], Brent [place] 01/28/2020   

## 2020-01-28 NOTE — Discharge Instructions (Signed)
COVID-19 COVID-19 is a respiratory infection that is caused by a virus called severe acute respiratory syndrome coronavirus 2 (SARS-CoV-2). The disease is also known as coronavirus disease or novel coronavirus. In some people, the virus may not cause any symptoms. In others, it may cause a serious infection. The infection can get worse quickly and can lead to complications, such as:  Pneumonia, or infection of the lungs.  Acute respiratory distress syndrome or ARDS. This is a condition in which fluid build-up in the lungs prevents the lungs from filling with air and passing oxygen into the blood.  Acute respiratory failure. This is a condition in which there is not enough oxygen passing from the lungs to the body or when carbon dioxide is not passing from the lungs out of the body.  Sepsis or septic shock. This is a serious bodily reaction to an infection.  Blood clotting problems.  Secondary infections due to bacteria or fungus.  Organ failure. This is when your body's organs stop working. The virus that causes COVID-19 is contagious. This means that it can spread from person to person through droplets from coughs and sneezes (respiratory secretions). What are the causes? This illness is caused by a virus. You may catch the virus by:  Breathing in droplets from an infected person. Droplets can be spread by a person breathing, speaking, singing, coughing, or sneezing.  Touching something, like a table or a doorknob, that was exposed to the virus (contaminated) and then touching your mouth, nose, or eyes. What increases the risk? Risk for infection You are more likely to be infected with this virus if you:  Are within 6 feet (2 meters) of a person with COVID-19.  Provide care for or live with a person who is infected with COVID-19.  Spend time in crowded indoor spaces or live in shared housing. Risk for serious illness You are more likely to become seriously ill from the virus if  you:  Are 50 years of age or older. The higher your age, the more you are at risk for serious illness.  Live in a nursing home or long-term care facility.  Have cancer.  Have a long-term (chronic) disease such as: ? Chronic lung disease, including chronic obstructive pulmonary disease or asthma. ? A long-term disease that lowers your body's ability to fight infection (immunocompromised). ? Heart disease, including heart failure, a condition in which the arteries that lead to the heart become narrow or blocked (coronary artery disease), a disease which makes the heart muscle thick, weak, or stiff (cardiomyopathy). ? Diabetes. ? Chronic kidney disease. ? Sickle cell disease, a condition in which red blood cells have an abnormal "sickle" shape. ? Liver disease.  Are obese. What are the signs or symptoms? Symptoms of this condition can range from mild to severe. Symptoms may appear any time from 2 to 14 days after being exposed to the virus. They include:  A fever or chills.  A cough.  Difficulty breathing.  Headaches, body aches, or muscle aches.  Runny or stuffy (congested) nose.  A sore throat.  New loss of taste or smell. Some people may also have stomach problems, such as nausea, vomiting, or diarrhea. Other people may not have any symptoms of COVID-19. How is this diagnosed? This condition may be diagnosed based on:  Your signs and symptoms, especially if: ? You live in an area with a COVID-19 outbreak. ? You recently traveled to or from an area where the virus is common. ? You   provide care for or live with a person who was diagnosed with COVID-19. ? You were exposed to a person who was diagnosed with COVID-19.  A physical exam.  Lab tests, which may include: ? Taking a sample of fluid from the back of your nose and throat (nasopharyngeal fluid), your nose, or your throat using a swab. ? A sample of mucus from your lungs (sputum). ? Blood tests.  Imaging tests,  which may include, X-rays, CT scan, or ultrasound. How is this treated? At present, there is no medicine to treat COVID-19. Medicines that treat other diseases are being used on a trial basis to see if they are effective against COVID-19. Your health care provider will talk with you about ways to treat your symptoms. For most people, the infection is mild and can be managed at home with rest, fluids, and over-the-counter medicines. Treatment for a serious infection usually takes places in a hospital intensive care unit (ICU). It may include one or more of the following treatments. These treatments are given until your symptoms improve.  Receiving fluids and medicines through an IV.  Supplemental oxygen. Extra oxygen is given through a tube in the nose, a face mask, or a hood.  Positioning you to lie on your stomach (prone position). This makes it easier for oxygen to get into the lungs.  Continuous positive airway pressure (CPAP) or bi-level positive airway pressure (BPAP) machine. This treatment uses mild air pressure to keep the airways open. A tube that is connected to a motor delivers oxygen to the body.  Ventilator. This treatment moves air into and out of the lungs by using a tube that is placed in your windpipe.  Tracheostomy. This is a procedure to create a hole in the neck so that a breathing tube can be inserted.  Extracorporeal membrane oxygenation (ECMO). This procedure gives the lungs a chance to recover by taking over the functions of the heart and lungs. It supplies oxygen to the body and removes carbon dioxide. Follow these instructions at home: Lifestyle  If you are sick, stay home except to get medical care. Your health care provider will tell you how long to stay home. Call your health care provider before you go for medical care.  Rest at home as told by your health care provider.  Do not use any products that contain nicotine or tobacco, such as cigarettes,  e-cigarettes, and chewing tobacco. If you need help quitting, ask your health care provider.  Return to your normal activities as told by your health care provider. Ask your health care provider what activities are safe for you. General instructions  Take over-the-counter and prescription medicines only as told by your health care provider.  Drink enough fluid to keep your urine pale yellow.  Keep all follow-up visits as told by your health care provider. This is important. How is this prevented?  There is no vaccine to help prevent COVID-19 infection. However, there are steps you can take to protect yourself and others from this virus. To protect yourself:   Do not travel to areas where COVID-19 is a risk. The areas where COVID-19 is reported change often. To identify high-risk areas and travel restrictions, check the CDC travel website: wwwnc.cdc.gov/travel/notices  If you live in, or must travel to, an area where COVID-19 is a risk, take precautions to avoid infection. ? Stay away from people who are sick. ? Wash your hands often with soap and water for 20 seconds. If soap and water   are not available, use an alcohol-based hand sanitizer. ? Avoid touching your mouth, face, eyes, or nose. ? Avoid going out in public, follow guidance from your state and local health authorities. ? If you must go out in public, wear a cloth face covering or face mask. Make sure your mask covers your nose and mouth. ? Avoid crowded indoor spaces. Stay at least 6 feet (2 meters) away from others. ? Disinfect objects and surfaces that are frequently touched every day. This may include:  Counters and tables.  Doorknobs and light switches.  Sinks and faucets.  Electronics, such as phones, remote controls, keyboards, computers, and tablets. To protect others: If you have symptoms of COVID-19, take steps to prevent the virus from spreading to others.  If you think you have a COVID-19 infection, contact  your health care provider right away. Tell your health care team that you think you may have a COVID-19 infection.  Stay home. Leave your house only to seek medical care. Do not use public transport.  Do not travel while you are sick.  Wash your hands often with soap and water for 20 seconds. If soap and water are not available, use alcohol-based hand sanitizer.  Stay away from other members of your household. Let healthy household members care for children and pets, if possible. If you have to care for children or pets, wash your hands often and wear a mask. If possible, stay in your own room, separate from others. Use a different bathroom.  Make sure that all people in your household wash their hands well and often.  Cough or sneeze into a tissue or your sleeve or elbow. Do not cough or sneeze into your hand or into the air.  Wear a cloth face covering or face mask. Make sure your mask covers your nose and mouth. Where to find more information  Centers for Disease Control and Prevention: www.cdc.gov/coronavirus/2019-ncov/index.html  World Health Organization: www.who.int/health-topics/coronavirus Contact a health care provider if:  You live in or have traveled to an area where COVID-19 is a risk and you have symptoms of the infection.  You have had contact with someone who has COVID-19 and you have symptoms of the infection. Get help right away if:  You have trouble breathing.  You have pain or pressure in your chest.  You have confusion.  You have bluish lips and fingernails.  You have difficulty waking from sleep.  You have symptoms that get worse. These symptoms may represent a serious problem that is an emergency. Do not wait to see if the symptoms will go away. Get medical help right away. Call your local emergency services (911 in the [place] Do not drive yourself to the hospital. Let the emergency medical personnel know if you think you have  COVID-19. Summary  COVID-19 is a respiratory infection that is caused by a virus. It is also known as coronavirus disease or novel coronavirus. It can cause serious infections, such as pneumonia, acute respiratory distress syndrome, acute respiratory failure, or sepsis.  The virus that causes COVID-19 is contagious. This means that it can spread from person to person through droplets from breathing, speaking, singing, coughing, or sneezing.  You are more likely to develop a serious illness if you are 50 years of age or older, have a weak immune system, live in a nursing home, or have chronic disease.  There is no medicine to treat COVID-19. Your health care provider will talk with you about ways to treat your symptoms.    Take steps to protect yourself and others from infection. Wash your hands often and disinfect objects and surfaces that are frequently touched every day. Stay away from people who are sick and wear a mask if you are sick. This information is not intended to replace advice given to you by your health care provider. Make sure you discuss any questions you have with your health care provider. Document Revised: 01/25/2019 Document Reviewed: 05/03/2018 Elsevier Patient Education  2020 Elsevier Inc. What types of side effects do monoclonal antibody drugs cause?  Common side effects  In general, the more common side effects caused by monoclonal antibody drugs include: . Allergic reactions, such as hives or itching . Flu-like signs and symptoms, including chills, fatigue, fever, and muscle aches and pains . Nausea, vomiting . Diarrhea . Skin rashes . Low blood pressure   The CDC is recommending patients who receive monoclonal antibody treatments wait at least 90 days before being vaccinated.  Currently, there are no data on the safety and efficacy of mRNA COVID-19 vaccines in persons who received monoclonal antibodies or convalescent plasma as part of COVID-19 treatment. Based  on the estimated half-life of such therapies as well as evidence suggesting that reinfection is uncommon in the 90 days after initial infection, vaccination should be deferred for at least 90 days, as a precautionary measure until additional information becomes available, to avoid interference of the antibody treatment with vaccine-induced immune responses. 

## 2020-08-25 ENCOUNTER — Ambulatory Visit (INDEPENDENT_AMBULATORY_CARE_PROVIDER_SITE_OTHER): Payer: BC Managed Care – PPO | Admitting: Internal Medicine

## 2020-08-25 ENCOUNTER — Encounter: Payer: Self-pay | Admitting: Internal Medicine

## 2020-08-25 ENCOUNTER — Other Ambulatory Visit: Payer: Self-pay

## 2020-08-25 VITALS — BP 122/70 | Ht 75.0 in | Wt 236.2 lb

## 2020-08-25 DIAGNOSIS — Z13220 Encounter for screening for lipoid disorders: Secondary | ICD-10-CM | POA: Diagnosis not present

## 2020-08-25 DIAGNOSIS — R0600 Dyspnea, unspecified: Secondary | ICD-10-CM | POA: Diagnosis not present

## 2020-08-25 DIAGNOSIS — E876 Hypokalemia: Secondary | ICD-10-CM

## 2020-08-25 DIAGNOSIS — R55 Syncope and collapse: Secondary | ICD-10-CM | POA: Diagnosis not present

## 2020-08-25 DIAGNOSIS — Z959 Presence of cardiac and vascular implant and graft, unspecified: Secondary | ICD-10-CM | POA: Diagnosis not present

## 2020-08-25 DIAGNOSIS — I471 Supraventricular tachycardia: Secondary | ICD-10-CM | POA: Diagnosis not present

## 2020-08-25 DIAGNOSIS — I4719 Other supraventricular tachycardia: Secondary | ICD-10-CM

## 2020-08-25 DIAGNOSIS — R0609 Other forms of dyspnea: Secondary | ICD-10-CM

## 2020-08-25 DIAGNOSIS — U099 Post covid-19 condition, unspecified: Secondary | ICD-10-CM

## 2020-08-25 NOTE — Progress Notes (Signed)
ELECTROPHYSIOLOGY OFFICE NOTE  Patient ID: [name], MRN: [id number], DOB/AGE: Jan 22, 1961 60 y.o. Admit date: (Not on file) Date of Consult: 08/25/2020  Primary Physician: Loura Pardon, MD Primary Cardiologist:          HPI [name] is a 60 y.o. 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  initially seen for episode of chest pain associated with a "heart rate over 280". 7/15 catheterization demonstrated no obstructive disease; left ventricular function was normal. He had a loop recorder implanted--7/18 he had a "5-second pause" at 2048 hrs.  Efforts to contact him were unsuccessful.  11/18 his device reached RRT  10/21 he developed COVID-pneumonia.  Hospitalized and treated with monoclonal antibodies.  Improved but has continued to struggle with dyspnea on exertion.  Sleeps on 2 pillows but has done this for years.  No edema no nocturnal dyspnea or chest discomfort.  Smokes but just 2 cigarettes a day.  He lives with his fiance and her mother both of whom   smoke    DATE TEST EF   7/15 Cath  Normal CAs  7/16 ECHO  NORMAL   7/16 ETT NO atrial tachycardia    Date Cr K Hgb  10/21 0.8 3.2 15.9            Past Medical History:  Diagnosis Date  . Chest pain with high risk for cardiac etiology 10/16/2013  . Fatigue due to sleep pattern disturbance 10/16/2013  . Implantable loop recorder    [employer] Linq  . Nonsustained ventricular tachycardia (HCC)    Identified on his Linq  . 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 Prostatism   . Sinus tachycardia   . Syncope 10/16/2013  . Tachycardia       Surgical History:  Past Surgical History:  Procedure Laterality Date  . LEFT HEART CATHETERIZATION WITH CORONARY ANGIOGRAM N/A 10/17/2013   Procedure: LEFT HEART CATHETERIZATION WITH CORONARY ANGIOGRAM;  Surgeon: Kathleene Hazel, MD;  Location[facility] C[facility] LAB;  Service: Cardiovascular;  Laterality: N/A;  . LOOP RECORDER IMPLANT  12-09-13   MDT LINQ implanted by Dr Graciela Husbands  for syncope  . LOOP RECORDER IMPLANT N/A 12/09/2013   Procedure: LOOP RECORDER IMPLANT;  Surgeon: Duke Salvia, MD;  Location: [facility] CATH LAB;  Service: Cardiovascular;  Laterality: N/A;     Home Meds: Current Meds  Medication Sig  . aspirin 81 MG chewable tablet Chew 81 mg by mouth daily.  . tamsulosin (FLOMAX) 0.4 MG CAPS capsule TAKE 1 CAPSULE BY MOUTH ONCE DAILY    Allergies:  Allergies  Allergen Reactions  . Amoxicillin Rash    Social History   Socioeconomic History  . Marital status: Widowed    Spouse name: Not on file  . Number of children: Not on file  . Years of education: Not on file  . Highest education level: Not on file  Occupational History  . Not on file  Tobacco Use  . Smoking status: Never Smoker  . Smokeless tobacco: Never Used  Substance and Sexual Activity  . Alcohol use: No  . Drug use: No  . Sexual activity: Yes  Other Topics Concern  . Not on file  Social History Narrative  . Not on file   Social Determinants of Health   Financial Resource Strain: Not on file  Food Insecurity: Not on file  Transportation Needs: Not on file  Physical Activity: Not on file  Stress: Not on file  Social Connections:  Not on file  Intimate Partner Violence: Not on file     Family History  Problem Relation Age of Onset  . Hypertension Mother   . Arrhythmia Mother   . Sudden death Maternal Grandfather 38     ROS:  Please see the history of present illness.     All other systems reviewed and negative.    Physical Exam: Blood pressure 122/70, height 6\' 3"  (1.905 m), weight 236 lb 4 oz (107.2 kg). General: Well developed, well nourished male in no acute distress. Head: Normocephalic, atraumatic, sclera non-icteric, no xanthomas, nares are without discharge. EENT: normal  Lymph Nodes:  none Neck: Negative for carotid bruits. JVD 8 cm Back:without scoliosis kyphosis Lungs: Clear bilaterally to auscultation without wheezes, rales, or rhonchi. Breathing is  unlabored. Heart: RRR with S1 S2. No murmur . No rubs, or gallops appreciated. Abdomen: Soft, non-tender, non-distended with normoactive bowel sounds. No hepatomegaly. No rebound/guarding. No obvious abdominal masses. Msk:  Strength and tone appear normal for age. Extremities: No clubbing or cyanosis.  tr edema.  Distal pedal pulses are 2+ and equal bilaterally. Skin: Warm and Dry Neuro: Alert and oriented X 3. CN III-XII intact Grossly normal sensory and motor function . Psych:  Responds to questions appropriately with a normal affect.      Labs: Cardiac Enzymes No results for input(s): CKTOTAL, CKMB, TROPONINI in the last 72 hours. CBC Lab Results  Component Value Date   WBC 5.6 12/03/2013   HGB 15.7 12/03/2013   HCT 46.6 12/03/2013   MCV 89.6 12/03/2013   PLT 266.0 12/03/2013   PROTIME: No results for input(s): LABPROT, INR in the last 72 hours. Chemistry No results for input(s): NA, K, CL, CO2, BUN, CREATININE, CALCIUM, PROT, BILITOT, ALKPHOS, ALT, AST, GLUCOSE in the last 168 hours.  Invalid input(s): LABALBU Lipids Lab Results  Component Value Date   CHOL 187 10/21/2013   HDL 55 10/21/2013   LDLCALC 117 (H) 10/21/2013   TRIG 73 10/21/2013   BNP No results found for: PROBNP Thyroid Function Tests: No results for input(s): TSH, T4TOTAL, T3FREE, THYROIDAB in the last 72 hours.  Invalid input(s): FREET3 Miscellaneous No results found for: DDIMER  Radiology/Studies:  No results found.  EKG: Sinus at 58 intervals 14/10/40   Assessment and Plan:  Dyspnea on exertion  COVID  Pneumonia in the past  Hyperlipidemia  Hypokalemia  Persistent dyspnea following COVID-pneumonia with a history of myocardial involvement as suggested by arrhythmias during hospitalization.  He is physical examination evidence of volume overload suggesting congestive heart failure, with either preserved or not preserved LV function.  We will obtain pulmonary function testing and chest  imaging to look for residual pulmonary effects, but also with his evidence of volume overload we will obtain echocardiogram.  Would anticipate the use of a diuretic but will hold off on that for right now.  He has a history of hyperlipidemia in 2015.  We will check his lipids again today.  Suspect he would benefit from primary prevention statins  Blood work reviewed from hospitalization 10/21 demonstrated hypokalemia we will recheck it today.        11/21

## 2020-08-25 NOTE — Patient Instructions (Addendum)
Medication Instructions:  - Your physician recommends that you continue on your current medications as directed. Please refer to the Current Medication list given to you today.  *If you need a refill on your cardiac medications before your next appointment, please call your pharmacy*   Lab Work: - Your physician recommends that you have lab work today: Lipid/ BMP   If you have labs (blood work) drawn today and your tests are completely normal, you will receive your results only by: [name] MyChart Message (if you have MyChart) OR . A paper copy in the mail If you have any lab test that is abnormal or we need to change your treatment, we will call you to review the results.   Testing/Procedures: 1) Chest x-ray: - A chest x-ray takes a picture of the organs and structures inside the chest, including the heart, lungs, and blood vessels. This test can show several things, including, whether the heart is enlarges; whether fluid is building up in the lungs; and whether pacemaker / defibrillator leads are still in place.  [facility] - done on a walk in basis, Monday- Friday (7:30 am- 5:30 pm) - check in at the Registration desk, 1st desk on the right   2) Pulmonary Function Test: - Your physician has recommended that you have a pulmonary function test. Pulmonary Function Tests are a group of tests that measure how well air moves in and out of your lungs.  [facility] - Tuesday 09/15/20 @ 2:00 pm (arrive @ 1:45 pm) - check in at the Registration desk, 1st desk on the right   3) Echocardiogram: - Your physician has requested that you have an echocardiogram. Echocardiography is a painless test that uses sound waves to create images of your heart. It provides your doctor with information about the size and shape of your heart and how well your heart's chambers and valves are working. This procedure takes approximately one hour. There are no restrictions for this  procedure. There is a possibility that an IV may need to be started during your test to inject an image enhancing agent. This is done to obtain more optimal pictures of your heart. Therefore we ask that you do at least drink some water prior to coming in to hydrate your veins.      Follow-Up: At [facility], you and your health needs are our priority.  As part of our continuing mission to provide you with exceptional heart care, we have created designated Provider Care Teams.  These Care Teams include your primary Cardiologist (physician) and Advanced Practice Providers (APPs -  Physician Assistants and Nurse Practitioners) who all work together to provide you with the care you need, when you need it.  We recommend signing up for the patient portal called "MyChart".  Sign up information is provided on this After Visit Summary.  MyChart is used to connect with patients for Virtual Visits (Telemedicine).  Patients are able to view lab/test results, encounter notes, upcoming appointments, etc.  Non-urgent messages can be sent to your provider as well.   To learn more about what you can do with MyChart, go to [email or web address].    Your next appointment:   1 year(s)  The format for your next appointment:   In Person  Provider:   Sherryl Manges, MD   Other Instructions n/a

## 2020-08-26 ENCOUNTER — Ambulatory Visit: Payer: Self-pay | Admitting: *Deleted

## 2020-08-26 LAB — LIPID PANEL
Chol/HDL Ratio: 4.2 ratio (ref 0.0–5.0)
Cholesterol, Total: 208 mg/dL — ABNORMAL HIGH (ref 100–199)
HDL: 49 mg/dL (ref >39)
LDL Chol Calc (NIH): 140 mg/dL — ABNORMAL HIGH (ref 0–99)
Triglycerides: 107 mg/dL (ref 0–149)
VLDL Cholesterol Cal: 19 mg/dL (ref 5–40)

## 2020-08-26 LAB — BASIC METABOLIC PANEL
BUN/Creatinine Ratio: 15 (ref 10–24)
BUN: 12 mg/dL (ref 8–27)
CO2: 20 mmol/L (ref 20–29)
Calcium: 9 mg/dL (ref 8.6–10.2)
Chloride: 104 mmol/L (ref 96–106)
Creatinine, Ser: 0.82 mg/dL (ref 0.76–1.27)
Glucose: 93 mg/dL (ref 65–99)
Potassium: 4.2 mmol/L (ref 3.5–5.2)
Sodium: 139 mmol/L (ref 134–144)
eGFR: 100 mL/min/{1.73_m2} (ref >59)

## 2020-08-26 NOTE — Telephone Encounter (Signed)
Please see message from nurse

## 2020-08-26 NOTE — Telephone Encounter (Signed)
Pt has appt 08/31/20, scheduled by agent. Sent to triage when pt mentioned SOB. Pt states he saw Dr. Graciela Husbands for that issue yesterday. States has multiple tests ordered by Dr. Graciela Husbands. Pt stated he called only to establish care. SOB mild, Dr. Koren Bound notes in Epic. Pt questioning if Dr. Charlotta Newton will handle "Things like my high cholesterol."  Pt's questions answered to his satisfaction. No triage. Did advise ED if worsens. Pt verbalizes understanding.  Reason for Disposition . Health Information question, no triage required and triager able to answer question  Answer Assessment - Initial Assessment Questions 1. REASON FOR CALL or QUESTION: "What is your reason for calling today?" or "How can I best help you?" or "What question do you have that I can help answer?"     "Will Dr. Charlotta Newton take care of issues like my high cholesterol?"  Protocols used: INFORMATION ONLY CALL - NO TRIAGE-A-AH

## 2020-08-31 ENCOUNTER — Ambulatory Visit
Admission: RE | Admit: 2020-08-31 | Discharge: 2020-08-31 | Disposition: A | Discharge status: Home / Self Care | Payer: BC Managed Care – PPO | Source: Ambulatory Visit | Attending: Internal Medicine | Admitting: Internal Medicine

## 2020-08-31 ENCOUNTER — Encounter: Payer: Self-pay | Admitting: Internal Medicine

## 2020-08-31 ENCOUNTER — Ambulatory Visit (INDEPENDENT_AMBULATORY_CARE_PROVIDER_SITE_OTHER): Payer: BC Managed Care – PPO | Admitting: Internal Medicine

## 2020-08-31 ENCOUNTER — Ambulatory Visit
Admission: RE | Admit: 2020-08-31 | Discharge: 2020-08-31 | Disposition: A | Discharge status: Home / Self Care | Payer: BC Managed Care – PPO | Source: Home / Self Care | Attending: Internal Medicine | Admitting: Internal Medicine

## 2020-08-31 ENCOUNTER — Other Ambulatory Visit: Payer: Self-pay

## 2020-08-31 ENCOUNTER — Ambulatory Visit
Admission: RE | Admit: 2020-08-31 | Discharge: 2020-08-31 | Disposition: A | Discharge status: Home / Self Care | Payer: BC Managed Care – PPO | Attending: Internal Medicine | Admitting: Internal Medicine

## 2020-08-31 VITALS — BP 129/85 | HR 67 | Temp 97.3°F | Ht 74.13 in | Wt 233.2 lb

## 2020-08-31 DIAGNOSIS — R0609 Other forms of dyspnea: Secondary | ICD-10-CM | POA: Insufficient documentation

## 2020-08-31 DIAGNOSIS — R0600 Dyspnea, unspecified: Secondary | ICD-10-CM | POA: Insufficient documentation

## 2020-08-31 DIAGNOSIS — J9811 Atelectasis: Secondary | ICD-10-CM | POA: Diagnosis not present

## 2020-08-31 DIAGNOSIS — R0781 Pleurodynia: Secondary | ICD-10-CM | POA: Diagnosis not present

## 2020-08-31 DIAGNOSIS — R079 Chest pain, unspecified: Secondary | ICD-10-CM | POA: Diagnosis not present

## 2020-08-31 DIAGNOSIS — S2242XA Multiple fractures of ribs, left side, initial encounter for closed fracture: Secondary | ICD-10-CM | POA: Diagnosis not present

## 2020-08-31 DIAGNOSIS — U099 Post covid-19 condition, unspecified: Secondary | ICD-10-CM | POA: Insufficient documentation

## 2020-08-31 LAB — URINALYSIS, ROUTINE W REFLEX MICROSCOPIC
Bilirubin, UA: NEGATIVE
Glucose, UA: NEGATIVE
Ketones, UA: NEGATIVE
Leukocytes,UA: NEGATIVE
Nitrite, UA: NEGATIVE
Protein,UA: NEGATIVE
RBC, UA: NEGATIVE
Specific Gravity, UA: >1.03 — ABNORMAL HIGH (ref 1.005–1.030)
Urobilinogen, Ur: 0.2 mg/dL (ref 0.2–1.0)
pH, UA: 6 (ref 5.0–7.5)

## 2020-08-31 MED ORDER — LIDOCAINE 5 % EX PTCH: 1.0000 | MEDICATED_PATCH | CUTANEOUS | 0 refills | Status: DC

## 2020-08-31 NOTE — Progress Notes (Signed)
Ht 6' 2.13" (1.883 m)   Wt 233 lb 3.2 oz (105.8 kg)   BMI 29.83 kg/m    Subjective:    Patient ID: [name], male    DOB: 08/10/1960, 60 y.o.   MRN: [id number]  HPI: [name] is a 60 y.o. male  Pt is here to [facility] care, sees dr. [name] for syncope - has an EKG and abnl pauses per cards notes. Persistent dyspnea per pt has to have aCXR / PFT per Cards , ECHO as well. COVID in October and had a rough time with such. [place] and was given O2, HR was up too per pt. Was admitted x 2 days got better and stil had peirods of SOB and hasnt really felt well since then.  Left sided abdominal / left rib pain   Shortness of Breath This is a new problem. The current episode started in the past 7 days. Pertinent negatives include no abdominal pain, chest pain, claudication, coryza, ear pain, fever, headaches, hemoptysis, leg pain, leg swelling, neck pain, orthopnea, PND, rash, rhinorrhea, sore throat, sputum production, swollen glands, syncope, vomiting or wheezing.    Chief Complaint  Patient presents with  . New Patient (Initial Visit)  . syncope  . Tachycardia    Sees Dr. [name]  . H/O prostatis  . H/O Covid    Relevant past medical, surgical, family and social history reviewed and updated as indicated. Interim medical history since our last visit reviewed. Allergies and medications reviewed and updated.  Review of Systems  Constitutional: Negative for fever.  HENT: Negative for ear pain, rhinorrhea and sore throat.   Respiratory: Positive for shortness of breath. Negative for hemoptysis, sputum production and wheezing.   Cardiovascular: Negative for chest pain, orthopnea, claudication, leg swelling, syncope and PND.  Gastrointestinal: Negative for abdominal pain and vomiting.  Musculoskeletal: Negative for neck pain.  Skin: Negative for rash.  Neurological: Negative for headaches.    Per HPI unless specifically indicated above     Objective:    Ht  6' 2.13" (1.883 m)   Wt 233 lb 3.2 oz (105.8 kg)   BMI 29.83 kg/m   Wt Readings from Last 3 Encounters:  08/31/20 233 lb 3.2 oz (105.8 kg)  08/25/20 236 lb 4 oz (107.2 kg)  03/02/16 221 lb 12.8 oz (100.6 kg)    Physical Exam Vitals and nursing note reviewed.  Constitutional:      General: He is not in acute distress.    Appearance: Normal appearance. He is not ill-appearing or diaphoretic.  HENT:     Head: Normocephalic and atraumatic.     Right Ear: Tympanic membrane and external ear normal. There is no impacted cerumen.     Left Ear: External ear normal.     Nose: No congestion or rhinorrhea.     Mouth/Throat:     Pharynx: No oropharyngeal exudate or posterior oropharyngeal erythema.  Eyes:     Conjunctiva/sclera: Conjunctivae normal.     Pupils: Pupils are equal, round, and reactive to light.  Cardiovascular:     Rate and Rhythm: Normal rate and regular rhythm.     Heart sounds: No murmur heard. No friction rub. No gallop.   Pulmonary:     Effort: No respiratory distress.     Breath sounds: No stridor. No wheezing or rhonchi.  Chest:     Chest wall: No tenderness.  Abdominal:     General: Abdomen is flat. Bowel sounds are normal.  Palpations: Abdomen is soft. There is no mass.     Tenderness: There is no abdominal tenderness.  Musculoskeletal:     Cervical back: Normal range of motion and neck supple. No rigidity or tenderness.     Left lower leg: No edema.  Skin:    General: Skin is warm and dry.  Neurological:     Mental Status: He is alert.     Results for orders placed or performed in visit on 08/25/20  Lipid Profile  Result Value Ref Range   Cholesterol, Total 208 (H) 100 - 199 mg/dL   Triglycerides 107 0 - 149 mg/dL   HDL 49 >39 mg/dL   VLDL Cholesterol Cal 19 5 - 40 mg/dL   LDL Chol Calc (NIH) 140 (H) 0 - 99 mg/dL   Chol/HDL Ratio 4.2 0.0 - 5.0 ratio  Basic metabolic panel  Result Value Ref Range   Glucose 93 65 - 99 mg/dL   BUN 12 8 - 27 mg/dL    Creatinine, Ser 0.82 0.76 - 1.27 mg/dL   eGFR 100 >59 mL/min/1.73   BUN/Creatinine Ratio 15 10 - 24   Sodium 139 134 - 144 mmol/L   Potassium 4.2 3.5 - 5.2 mmol/L   Chloride 104 96 - 106 mmol/L   CO2 20 20 - 29 mmol/L   Calcium 9.0 8.6 - 10.2 mg/dL        Current Outpatient Medications:  .  aspirin 81 MG chewable tablet, Chew 81 mg by mouth daily., Disp: , Rfl:  .  tamsulosin (FLOMAX) 0.4 MG CAPS capsule, TAKE 1 CAPSULE BY MOUTH ONCE DAILY, Disp: 30 capsule, Rfl: 12    Assessment & Plan:  Left sided rib pain : s/p fall  Check CXR STAT.   SOB : fu and mx per cards  BPH will start pt back on flomax.    Problem List Items Addressed This Visit   None   Visit Diagnoses    Rib pain    -  Primary   Relevant Orders   DG Ribs Unilateral Left (Completed)   TSH (Completed)   PSA (Completed)   CBC with Differential/Platelet (Completed)   Comprehensive metabolic panel (Completed)   Urinalysis, Routine w reflex microscopic (Completed)       Follow up plan: No follow-ups on file.  Addendum  Received results of xryas pt has  FINDINGS: The patient has an acute, mildly displaced fracture of the anterolateral arc of the left tenth rib. No other fracture or bony abnormality is identified. Loop recorder noted.  IMPRESSION: Acute, mildly displaced left tenth rib fracture.

## 2020-09-01 LAB — CBC WITH DIFFERENTIAL/PLATELET
Basophils Absolute: 0.1 10*3/uL (ref 0.0–0.2)
Basos: 1 %
EOS (ABSOLUTE): 0.2 10*3/uL (ref 0.0–0.4)
Eos: 3 %
Hematocrit: 49.1 % (ref 37.5–51.0)
Hemoglobin: 16.4 g/dL (ref 13.0–17.7)
Immature Grans (Abs): 0 10*3/uL (ref 0.0–0.1)
Immature Granulocytes: 0 %
Lymphocytes Absolute: 1.8 10*3/uL (ref 0.7–3.1)
Lymphs: 26 %
MCH: 29.5 pg (ref 26.6–33.0)
MCHC: 33.4 g/dL (ref 31.5–35.7)
MCV: 89 fL (ref 79–97)
Monocytes Absolute: 0.7 10*3/uL (ref 0.1–0.9)
Monocytes: 10 %
Neutrophils Absolute: 4.1 10*3/uL (ref 1.4–7.0)
Neutrophils: 60 %
Platelets: 285 10*3/uL (ref 150–450)
RBC: 5.55 x10E6/uL (ref 4.14–5.80)
RDW: 12.7 % (ref 11.6–15.4)
WBC: 6.9 10*3/uL (ref 3.4–10.8)

## 2020-09-01 LAB — COMPREHENSIVE METABOLIC PANEL
ALT: 14 IU/L (ref 0–44)
AST: 20 IU/L (ref 0–40)
Albumin/Globulin Ratio: 1.5 (ref 1.2–2.2)
Albumin: 4.3 g/dL (ref 3.8–4.8)
Alkaline Phosphatase: 100 IU/L (ref 44–121)
BUN/Creatinine Ratio: 14 (ref 10–24)
BUN: 13 mg/dL (ref 8–27)
Bilirubin Total: 0.5 mg/dL (ref 0.0–1.2)
CO2: 20 mmol/L (ref 20–29)
Calcium: 9.2 mg/dL (ref 8.6–10.2)
Chloride: 103 mmol/L (ref 96–106)
Creatinine, Ser: 0.9 mg/dL (ref 0.76–1.27)
Globulin, Total: 2.8 g/dL (ref 1.5–4.5)
Glucose: 84 mg/dL (ref 65–99)
Potassium: 3.9 mmol/L (ref 3.5–5.2)
Sodium: 140 mmol/L (ref 134–144)
Total Protein: 7.1 g/dL (ref 6.0–8.5)
eGFR: 97 mL/min/{1.73_m2} (ref >59)

## 2020-09-01 LAB — TSH: TSH: 0.597 u[IU]/mL (ref 0.450–4.500)

## 2020-09-01 LAB — PSA: Prostate Specific Ag, Serum: 1.7 ng/mL (ref 0.0–4.0)

## 2020-09-14 ENCOUNTER — Other Ambulatory Visit: Payer: Self-pay

## 2020-09-14 ENCOUNTER — Ambulatory Visit (INDEPENDENT_AMBULATORY_CARE_PROVIDER_SITE_OTHER): Payer: BC Managed Care – PPO | Admitting: Internal Medicine

## 2020-09-14 ENCOUNTER — Encounter: Payer: Self-pay | Admitting: Internal Medicine

## 2020-09-14 VITALS — BP 120/79 | HR 66 | Temp 98.6°F | Ht 74.25 in | Wt 235.8 lb

## 2020-09-14 DIAGNOSIS — Z1211 Encounter for screening for malignant neoplasm of colon: Secondary | ICD-10-CM | POA: Diagnosis not present

## 2020-09-14 DIAGNOSIS — R399 Unspecified symptoms and signs involving the genitourinary system: Secondary | ICD-10-CM | POA: Diagnosis not present

## 2020-09-14 DIAGNOSIS — S2242XD Multiple fractures of ribs, left side, subsequent encounter for fracture with routine healing: Secondary | ICD-10-CM

## 2020-09-14 MED ORDER — TAMSULOSIN HCL 0.4 MG PO CAPS: 0.4000 mg | ORAL_CAPSULE | Freq: Every day | ORAL | 12 refills | Status: DC

## 2020-09-14 NOTE — Progress Notes (Signed)
BP 120/79   Pulse 66   Temp 98.6 F (37 C) (Oral)   Ht 6' 2.25" (1.886 m)   Wt 235 lb 12.8 oz (107 kg)   SpO2 98%   BMI 30.07 kg/m    Subjective:    Patient ID: [name], male    DOB: May 30, 1960, 60 y.o.   MRN: [id number]  HPI: [name] is a 60 y.o. male  Pt is here for a physical. He had a recent rib Fracture, had a " twist " picking up trash from a lawn mower H/o - cardiac arrythmia sees d.r klein for such He had a loop recorder implanted--7/18 he had a "5-second pause" at 2048 hrs.  Efforts to contact him were unsuccessful.  11/18 his device reached RRT   Shortness of Breath This is a chronic (on exertion , not at rest. non smoker.  sees cards for such has had this x 2 months. seen dr [name] - ) problem. The current episode started more than 1 year ago.    Chief Complaint  Patient presents with  . Annual Exam    Relevant past medical, surgical, family and social history reviewed and updated as indicated. Interim medical history since our last visit reviewed. Allergies and medications reviewed and updated.  Review of Systems  Respiratory: Positive for shortness of breath.     Per HPI unless specifically indicated above     Objective:    BP 120/79   Pulse 66   Temp 98.6 F (37 C) (Oral)   Ht 6' 2.25" (1.886 m)   Wt 235 lb 12.8 oz (107 kg)   SpO2 98%   BMI 30.07 kg/m   Wt Readings from Last 3 Encounters:  09/14/20 235 lb 12.8 oz (107 kg)  08/31/20 233 lb 3.2 oz (105.8 kg)  08/25/20 236 lb 4 oz (107.2 kg)    Physical Exam Vitals and nursing note reviewed.  Constitutional:      General: He is not in acute distress.    Appearance: Normal appearance. He is not ill-appearing or diaphoretic.  HENT:     Head: Normocephalic and atraumatic.     Right Ear: Tympanic membrane and external ear normal. There is no impacted cerumen.     Left Ear: External ear normal.     Nose: No congestion or rhinorrhea.     Mouth/Throat:     Pharynx: No oropharyngeal  exudate or posterior oropharyngeal erythema.  Eyes:     Conjunctiva/sclera: Conjunctivae normal.     Pupils: Pupils are equal, round, and reactive to light.  Cardiovascular:     Rate and Rhythm: Normal rate and regular rhythm.     Heart sounds: No murmur heard. No friction rub. No gallop.   Pulmonary:     Effort: No respiratory distress.     Breath sounds: No stridor. No wheezing or rhonchi.  Chest:     Chest wall: No tenderness.  Abdominal:     General: Abdomen is flat. Bowel sounds are normal. There is no distension.     Palpations: Abdomen is soft. There is no mass.     Tenderness: There is no abdominal tenderness. There is no guarding.  Musculoskeletal:        General: No swelling or deformity.     Cervical back: Normal range of motion and neck supple. No rigidity or tenderness.     Right lower leg: No edema.     Left lower leg: No edema.  Skin:  General: Skin is warm and dry.     Coloration: Skin is not jaundiced.     Findings: No erythema.  Neurological:     Mental Status: He is alert and oriented to person, place, and time. Mental status is at baseline.  Psychiatric:        Mood and Affect: Mood normal.        Behavior: Behavior normal.        Thought Content: Thought content normal.        Judgment: Judgment normal.     Results for orders placed or performed in visit on 08/31/20  TSH  Result Value Ref Range   TSH 0.597 0.450 - 4.500 uIU/mL  PSA  Result Value Ref Range   Prostate Specific Ag, Serum 1.7 0.0 - 4.0 ng/mL  CBC with Differential/Platelet  Result Value Ref Range   WBC 6.9 3.4 - 10.8 x10E3/uL   RBC 5.55 4.14 - 5.80 x10E6/uL   Hemoglobin 16.4 13.0 - 17.7 g/dL   Hematocrit 49.1 37.5 - 51.0 %   MCV 89 79 - 97 fL   MCH 29.5 26.6 - 33.0 pg   MCHC 33.4 31.5 - 35.7 g/dL   RDW 12.7 11.6 - 15.4 %   Platelets 285 150 - 450 x10E3/uL   Neutrophils 60 Not Estab. %   Lymphs 26 Not Estab. %   Monocytes 10 Not Estab. %   Eos 3 Not Estab. %   Basos 1 Not  Estab. %   Neutrophils Absolute 4.1 1.4 - 7.0 x10E3/uL   Lymphocytes Absolute 1.8 0.7 - 3.1 x10E3/uL   Monocytes Absolute 0.7 0.1 - 0.9 x10E3/uL   EOS (ABSOLUTE) 0.2 0.0 - 0.4 x10E3/uL   Basophils Absolute 0.1 0.0 - 0.2 x10E3/uL   Immature Granulocytes 0 Not Estab. %   Immature Grans (Abs) 0.0 0.0 - 0.1 x10E3/uL  Comprehensive metabolic panel  Result Value Ref Range   Glucose 84 65 - 99 mg/dL   BUN 13 8 - 27 mg/dL   Creatinine, Ser 0.90 0.76 - 1.27 mg/dL   eGFR 97 >59 mL/min/1.73   BUN/Creatinine Ratio 14 10 - 24   Sodium 140 134 - 144 mmol/L   Potassium 3.9 3.5 - 5.2 mmol/L   Chloride 103 96 - 106 mmol/L   CO2 20 20 - 29 mmol/L   Calcium 9.2 8.6 - 10.2 mg/dL   Total Protein 7.1 6.0 - 8.5 g/dL   Albumin 4.3 3.8 - 4.8 g/dL   Globulin, Total 2.8 1.5 - 4.5 g/dL   Albumin/Globulin Ratio 1.5 1.2 - 2.2   Bilirubin Total 0.5 0.0 - 1.2 mg/dL   Alkaline Phosphatase 100 44 - 121 IU/L   AST 20 0 - 40 IU/L   ALT 14 0 - 44 IU/L  Urinalysis, Routine w reflex microscopic  Result Value Ref Range   Specific Gravity, UA >1.030 (H) 1.005 - 1.030   pH, UA 6.0 5.0 - 7.5   Color, UA Yellow Yellow   Appearance Ur Clear Clear   Leukocytes,UA Negative Negative   Protein,UA Negative Negative/Trace   Glucose, UA Negative Negative   Ketones, UA Negative Negative   RBC, UA Negative Negative   Bilirubin, UA Negative Negative   Urobilinogen, Ur 0.2 0.2 - 1.0 mg/dL   Nitrite, UA Negative Negative        Current Outpatient Medications:  .  aspirin 81 MG chewable tablet, Chew 81 mg by mouth daily., Disp: , Rfl:  .  lidocaine (LIDODERM) 5 %, Place 1  patch onto the skin daily. Remove & Discard patch within 12 hours or as directed by MD, Disp: 30 patch, Rfl: 0 .  tamsulosin (FLOMAX) 0.4 MG CAPS capsule, Take 1 capsule (0.4 mg total) by mouth daily., Disp: 30 capsule, Rfl: 12    Assessment & Plan:  Left sided rib pain : s/p fall. lidoderm patches FINDINGS: The patient has an acute, mildly displaced  fracture of the anterolateral arc of the left tenth rib. No other fracture or bony abnormality is identified. Loop recorder noted.  IMPRESSION: Acute, mildly displaced left tenth rib fracture.  Check DEXA scan   SOB : fu and mx per cards to have PFTS tommorow  ECHO - this month   BPH will start pt back on flomax.   Problem List Items Addressed This Visit   None   Visit Diagnoses    Lower urinary tract symptoms (LUTS)       Will restart flomax.    Relevant Medications   tamsulosin (FLOMAX) 0.4 MG CAPS capsule       Follow up plan: No follow-ups on file.  Health Maintenance : PSA :  05/25/20. Cscope : dad died from colon cancer when he was 38, pt has had cscope s- 5 yrs , has had them x 2 before.

## 2020-09-15 ENCOUNTER — Telehealth: Payer: Self-pay | Admitting: Internal Medicine

## 2020-09-15 ENCOUNTER — Ambulatory Visit: Payer: BC Managed Care – PPO | Attending: Internal Medicine

## 2020-09-15 DIAGNOSIS — E782 Mixed hyperlipidemia: Secondary | ICD-10-CM

## 2020-09-15 DIAGNOSIS — R0600 Dyspnea, unspecified: Secondary | ICD-10-CM

## 2020-09-15 DIAGNOSIS — R0609 Other forms of dyspnea: Secondary | ICD-10-CM

## 2020-09-15 NOTE — Telephone Encounter (Signed)
Patient is calling,states he was not informed he had to have a COVID test before his PFT. Please call to discuss and reschedule. States his test had to be cancelled due to he did not have a COVID test. STates he also has some fractured ribs and would not have been able to do the test .

## 2020-09-15 NOTE — Telephone Encounter (Signed)
Brent Salvia, MD  09/02/2020 5:39 PM EDT      Please Inform Patient that labs show a 9% 10 yr risk and statins are recmmended  Calcium scoring could be used to help determine the role of statins  Thanks

## 2020-09-15 NOTE — Telephone Encounter (Signed)
I spoke with the patient regarding his lab results. I advised him I was sorry for the delayed call with these results as Dr. Graciela Husbands has been out of the office quite a bit and I wanted to try to clarify with him:  1) Are we proceeding with a Calcium Score 1st 2) Then deciding on a statin, or just put him on a statin  The patient is aware I will clarify with Dr. Graciela Husbands next week and call him back.  The patient voices understanding and is agreeable.

## 2020-09-15 NOTE — Telephone Encounter (Signed)
I spoke with the patient. He advised that he showed up for his PFT's today and was told this couldn't be done due to the fact he has not had a COVID test.  I apologized to the patient and advised that when I called to schedule this I advised he had had COVID, but this was outside his 90-day window. I was told be scheduling they did not think he would need this and would let me know if they found out different.   The patient then advised that ~ 08/29/20 he was mowing and bent over to pick up a stick. He thought he had pulled a muscle in his side, but this kept bothering him so he went to see his PCP and had x-rays done that confirmed a rib fracture on the left side.  The patient was told by the technician today that the PFT's would require some extensive effort with his breathing and they did not feel he should proceed with the test at this time anyway due to the rib fracture.  The patient states his PCP told him that it would take ~ 8-10 weeks to heal.  I have advised the patient that I do feel like we should wait several weeks to try to reschedule to see how his ribs are doing at that time.  The patient is aware I will call him back in ~ 6 weeks to see how his ribs are feeling and then we can proceed with rescheduling his PFT's with a COVID test prior.  The patient voices understanding and is agreeable with the plan as stated above.

## 2020-09-21 NOTE — Telephone Encounter (Signed)
Tried to call him today--no answer, I did not leave message on voice mail  I would be inclined towards calcium scoring and if 0 could avoid stating Thanks SK

## 2020-09-25 NOTE — Telephone Encounter (Signed)
I called and spoke with the patient about Dr. Odessa Fleming recommendations to proceed with the Calcium Scoring first. He is aware that per Dr. Graciela Husbands, if his score comes back at a "0" then he would not need to start statin therapy.  The patient voices understanding and is agreeable.  He is aware that there will be a $99 out of pocket cost for the test to be paid at the time of testing. He is also aware that this will be done at [facility] and that he may call [phone] to schedule.  Order placed.

## 2020-09-29 ENCOUNTER — Ambulatory Visit (INDEPENDENT_AMBULATORY_CARE_PROVIDER_SITE_OTHER): Payer: BC Managed Care – PPO

## 2020-09-29 ENCOUNTER — Other Ambulatory Visit: Payer: Self-pay

## 2020-09-29 DIAGNOSIS — I77810 Thoracic aortic ectasia: Secondary | ICD-10-CM

## 2020-09-29 DIAGNOSIS — R0600 Dyspnea, unspecified: Secondary | ICD-10-CM

## 2020-09-29 DIAGNOSIS — I5022 Chronic systolic (congestive) heart failure: Secondary | ICD-10-CM

## 2020-09-29 HISTORY — DX: Chronic systolic (congestive) heart failure: I50.22

## 2020-09-29 HISTORY — DX: Thoracic aortic ectasia: I77.810

## 2020-09-29 LAB — ECHOCARDIOGRAM COMPLETE
AR max vel: 3.48 cm2
AV Area VTI: 3.76 cm2
AV Area mean vel: 3.59 cm2
AV Mean grad: 2 mmHg
AV Peak grad: 4.2 mmHg
Ao pk vel: 1.02 m/s
Area-P 1/2: 2.23 cm2
S' Lateral: 4.3 cm
Single Plane A4C EF: 49.2 %

## 2020-10-01 ENCOUNTER — Telehealth: Payer: Self-pay | Admitting: Internal Medicine

## 2020-10-01 NOTE — Telephone Encounter (Signed)
Patient calling to discuss mychart msg about echo results.    Patient aware he will receive a call back in 1-2 days pending md reflection on results and poc .     Patient understands and will await a call back.

## 2020-10-01 NOTE — Telephone Encounter (Signed)
H  thx   look at result note plz

## 2020-10-01 NOTE — Telephone Encounter (Signed)
Noted- waiting on Dr. Graciela Husbands to review echo results from 10/01/20.   Will forward to MD as patient is calling for results.

## 2020-10-02 ENCOUNTER — Telehealth: Payer: Self-pay | Admitting: Internal Medicine

## 2020-10-02 DIAGNOSIS — R0600 Dyspnea, unspecified: Secondary | ICD-10-CM

## 2020-10-02 DIAGNOSIS — R931 Abnormal findings on diagnostic imaging of heart and coronary circulation: Secondary | ICD-10-CM

## 2020-10-02 NOTE — Telephone Encounter (Signed)
See 6/24 phone note.

## 2020-10-02 NOTE — Telephone Encounter (Signed)
I spoke with the patient. I have advised him of his echo results and Dr. Odessa Fleming recommendations to proceed with a nuclear stress test.  I have explained the myoview test process to him and he does feel that he can walk on a treadmill. He is aware I will order this as an exercise myoview, but they can convert this to a chemical test if needed.  The patient is agreeable.   He is currently scheduled for a CT  Calcium score on 10/09/20, based off of his recent lipid panel and recommendations from Dr. Graciela Husbands, but I have advised him that since his EF is abnormal and we are proceeding with nuclear testing, then he should not need the Calcium score.  He is aware I will ask scheduling to call him to set this up. I will need to call him back with his instructions.  Will also ask scheduling to cancel his CT Calcium score.  Will forward to Dr. Graciela Husbands to sign the attestation order.

## 2020-10-02 NOTE — Telephone Encounter (Signed)
Brent Salvia, MD  10/01/2020 12:34 PM EDT      Please Inform Patient Echo showed  mild intercurrent worsening of LV heart muscle function EF now 45% or so and previously (2016) normal.  There is WMA suggesting possible interval MI   lets do Myoviiew to sort out whether there is ischmic component >> CXR shows only mild "atelecatasis"  could represent somescarring.   Thanks SK

## 2020-10-05 ENCOUNTER — Telehealth (INDEPENDENT_AMBULATORY_CARE_PROVIDER_SITE_OTHER): Payer: BC Managed Care – PPO | Admitting: Gastroenterology

## 2020-10-05 ENCOUNTER — Other Ambulatory Visit: Payer: Self-pay

## 2020-10-05 DIAGNOSIS — Z800 Family history of malignant neoplasm of digestive organs: Secondary | ICD-10-CM

## 2020-10-05 DIAGNOSIS — Z1211 Encounter for screening for malignant neoplasm of colon: Secondary | ICD-10-CM

## 2020-10-05 MED ORDER — PEG 3350-KCL-NA BICARB-NACL 420 G PO SOLR: 4000.0000 mL | Freq: Once | ORAL | 0 refills | Status: AC

## 2020-10-05 NOTE — Progress Notes (Signed)
Gastroenterology Pre-Procedure Review  Request Date: 11/24/20 Requesting Physician: Dr. Servando Snare  PATIENT REVIEW QUESTIONS: The patient responded to the following health history questions as indicated:    1. Are you having any GI issues? no 2. Do you have a personal history of Polyps? yes (2005) 3. Do you have a family history of Colon Cancer or Polyps? yes (father colon cancer) 4. Diabetes Mellitus? no 5. Joint replacements in the past 12 months?no 6. Major health problems in the past 3 months?no 7. Any artificial heart valves, MVP, or defibrillator?no    MEDICATIONS & ALLERGIES:    Patient reports the following regarding taking any anticoagulation/antiplatelet therapy:   Plavix, Coumadin, Eliquis, Xarelto, Lovenox, Pradaxa, Brilinta, or Effient? no Aspirin? yes (81 mg)  Patient confirms/reports the following medications:  Current Outpatient Medications  Medication Sig Dispense Refill   aspirin 81 MG chewable tablet Chew 81 mg by mouth daily.     lidocaine (LIDODERM) 5 % Place 1 patch onto the skin daily. Remove & Discard patch within 12 hours or as directed by MD 30 patch 0   tamsulosin (FLOMAX) 0.4 MG CAPS capsule Take 1 capsule (0.4 mg total) by mouth daily. 30 capsule 12   No current facility-administered medications for this visit.    Patient confirms/reports the following allergies:  Allergies  Allergen Reactions   Amoxicillin Rash    No orders of the defined types were placed in this encounter.   AUTHORIZATION INFORMATION Primary Insurance: 1D#: Group #:  Secondary Insurance: 1D#: Group #:  SCHEDULE INFORMATION: Date: 11/24/20 Time: Location: [facility]

## 2020-10-06 NOTE — Telephone Encounter (Signed)
Called and spoke with pt. Myoview is scheduled tomorrow 10/07/20. Instructions reviewed with pt below.  Pt aware to arrive at [facility] for 9:30 AM procedure at the registation desk at the [facility]. Pt states he has no further questions at this time.   [facility] MYOVIEW  Your caregiver has ordered a Stress Test with nuclear imaging. The purpose of this test is to evaluate the blood supply to your heart muscle. This procedure is referred to as a "Non-Invasive Stress Test." This is because other than having an IV started in your vein, nothing is inserted or "invades" your body. Cardiac stress tests are done to find areas of poor blood flow to the heart by determining the extent of coronary artery disease (CAD). Some patients exercise on a treadmill, which naturally increases the blood flow to your heart, while others who are  unable to walk on a treadmill due to physical limitations have a pharmacologic/chemical stress agent called Lexiscan . This medicine will mimic walking on a treadmill by temporarily increasing your coronary blood flow.   Please note: these test may take anywhere between 2-4 hours to complete  PLEASE REPORT TO [facility] ENTRANCE  THE VOLUNTEERS AT THE FIRST DESK WILL DIRECT YOU WHERE TO GO  Date of Procedure: 10/07/20  Arrival Time for Procedure: 9:00 AM   PLEASE NOTIFY THE OFFICE AT LEAST 24 HOURS IN ADVANCE IF YOU ARE UNABLE TO KEEP YOUR APPOINTMENT.  [phone] AND  PLEASE NOTIFY NUCLEAR MEDICINE AT [facility] AT LEAST 24 HOURS IN ADVANCE IF YOU ARE UNABLE TO KEEP YOUR APPOINTMENT. [phone]  How to prepare for your Myoview test:  Do not eat or drink after midnight No caffeine for 24 hours prior to test No smoking 24 hours prior to test. Your medication may be taken with water.  If your doctor stopped a medication because of this test, do not take that medication. Please wear a short sleeve shirt. No perfume, cologne or lotion. Wear comfortable walking shoes.

## 2020-10-07 ENCOUNTER — Other Ambulatory Visit: Payer: Self-pay

## 2020-10-07 ENCOUNTER — Ambulatory Visit
Admission: RE | Admit: 2020-10-07 | Discharge: 2020-10-07 | Disposition: A | Discharge status: Home / Self Care | Payer: BC Managed Care – PPO | Source: Ambulatory Visit | Attending: Internal Medicine | Admitting: Internal Medicine

## 2020-10-07 DIAGNOSIS — R0600 Dyspnea, unspecified: Secondary | ICD-10-CM | POA: Insufficient documentation

## 2020-10-07 DIAGNOSIS — R931 Abnormal findings on diagnostic imaging of heart and coronary circulation: Secondary | ICD-10-CM | POA: Insufficient documentation

## 2020-10-07 LAB — NM MYOCAR MULTI W/SPECT W/WALL MOTION / EF
Estimated workload: 6.7 METS
Exercise duration (min): 4 min
Exercise duration (sec): 45 s
LV dias vol: 81 mL (ref 62–150)
LV sys vol: 34 mL
Peak HR: 141 {beats}/min
Percent HR: 88 %
Rest HR: 61 {beats}/min
SDS: 0
SRS: 3
SSS: 3
TID: 0.86

## 2020-10-07 MED ORDER — TECHNETIUM TC 99M TETROFOSMIN IV KIT
9.9300 | PACK | Freq: Once | INTRAVENOUS | Status: AC | PRN
  Administered 2020-10-07: 9.93 via INTRAVENOUS

## 2020-10-07 MED ORDER — TECHNETIUM TC 99M TETROFOSMIN IV KIT
30.0000 | PACK | Freq: Once | INTRAVENOUS | Status: AC | PRN
  Administered 2020-10-07: 29.5 via INTRAVENOUS

## 2020-10-08 ENCOUNTER — Other Ambulatory Visit: Payer: Self-pay | Admitting: *Deleted

## 2020-10-08 MED ORDER — ENTRESTO 24-26 MG PO TABS: 1.0000 | ORAL_TABLET | Freq: Two times a day (BID) | ORAL | 6 refills | Status: DC

## 2020-10-09 ENCOUNTER — Other Ambulatory Visit: Payer: BC Managed Care – PPO

## 2020-10-15 ENCOUNTER — Telehealth: Payer: Self-pay | Admitting: Internal Medicine

## 2020-10-15 ENCOUNTER — Ambulatory Visit (INDEPENDENT_AMBULATORY_CARE_PROVIDER_SITE_OTHER): Payer: BC Managed Care – PPO | Admitting: Internal Medicine

## 2020-10-15 ENCOUNTER — Encounter: Payer: Self-pay | Admitting: Internal Medicine

## 2020-10-15 ENCOUNTER — Ambulatory Visit
Admission: RE | Admit: 2020-10-15 | Discharge: 2020-10-15 | Disposition: A | Discharge status: Home / Self Care | Payer: BC Managed Care – PPO | Attending: Internal Medicine | Admitting: Internal Medicine

## 2020-10-15 ENCOUNTER — Other Ambulatory Visit: Payer: Self-pay

## 2020-10-15 ENCOUNTER — Ambulatory Visit
Admission: RE | Admit: 2020-10-15 | Discharge: 2020-10-15 | Disposition: A | Discharge status: Home / Self Care | Payer: BC Managed Care – PPO | Source: Ambulatory Visit | Attending: Internal Medicine | Admitting: Internal Medicine

## 2020-10-15 VITALS — BP 121/76 | HR 82 | Temp 98.8°F | Ht 74.25 in | Wt 234.6 lb

## 2020-10-15 DIAGNOSIS — R040 Epistaxis: Secondary | ICD-10-CM

## 2020-10-15 DIAGNOSIS — S2239XK Fracture of one rib, unspecified side, subsequent encounter for fracture with nonunion: Secondary | ICD-10-CM

## 2020-10-15 DIAGNOSIS — S2242XD Multiple fractures of ribs, left side, subsequent encounter for fracture with routine healing: Secondary | ICD-10-CM | POA: Diagnosis not present

## 2020-10-15 DIAGNOSIS — Z1329 Encounter for screening for other suspected endocrine disorder: Secondary | ICD-10-CM | POA: Diagnosis not present

## 2020-10-15 DIAGNOSIS — Z136 Encounter for screening for cardiovascular disorders: Secondary | ICD-10-CM

## 2020-10-15 DIAGNOSIS — S2239XD Fracture of one rib, unspecified side, subsequent encounter for fracture with routine healing: Secondary | ICD-10-CM

## 2020-10-15 MED ORDER — ATORVASTATIN CALCIUM 20 MG PO TABS
20.0000 mg | ORAL_TABLET | Freq: Every day | ORAL | 3 refills | Status: DC
Start: 2020-10-15 — End: 2020-11-25

## 2020-10-15 MED ORDER — OMEPRAZOLE 20 MG PO CPDR
20.0000 mg | DELAYED_RELEASE_CAPSULE | Freq: Every day | ORAL | 3 refills | Status: DC
Start: 2020-10-15 — End: 2021-01-13

## 2020-10-15 NOTE — Telephone Encounter (Signed)
PCP nurse is calling stating patient was in their office this morning complaining of chest pain  and SOB that comes and goes. Patient currently is not having chest pain. Please call to discuss.

## 2020-10-15 NOTE — Progress Notes (Signed)
Please let pt know this is healing thnx.

## 2020-10-15 NOTE — Telephone Encounter (Signed)
The pt sounds like he is having on going complaints of chest pain and sob--he  may need another evaluation because the issue will be as to wheter the symptoms require a cath notwithstanding negative myoview-- ie are the symptoms strongly suggestive of angina. Thanks SK

## 2020-10-15 NOTE — Progress Notes (Signed)
BP 121/76   Pulse 82   Temp 98.8 F (37.1 C) (Oral)   Ht 6' 2.25" (1.886 m)   Wt 234 lb 9.6 oz (106.4 kg)   SpO2 97%   BMI 29.92 kg/m    Subjective:    Patient ID: [name], male    DOB: 26-Jun-1960, 60 y.o.   MRN: [id number]  Chief Complaint  Patient presents with   Rib Fracture    Here to follow up on his Rib Fracture    HPI: DAYMOND CORDTS is a 60 y.o. male  Pt seen by cards last Friday and was placed on enteresto takes 2 tabs bid showed  -   per telephone encounter from Dr. [name] -  Pt has mild intercurrent worsening of LV heart muscle function EF now 45% or so and previously (2016) normal.  There is WMA suggesting possible interval MI   lets do Myoviiew to sort out whether there is ischmic component >> CXR shows only mild "atelecatasis"  could represent somescarring.       Shortness of Breath This is a chronic (SOBOE only now with chest pain started x 6 months ago, restarted seeing dr. Lovena Neighbours at that point) problem. The current episode started more than 1 year ago. The problem has been waxing and waning. Pertinent negatives include no abdominal pain, chest pain, claudication, ear pain, fever, hemoptysis, leg pain, leg swelling, rhinorrhea, sore throat, sputum production, swollen glands, syncope or vomiting. Associated symptoms comments: CP non today - cp more on exertion. Happens when he wakes up in the morning in the centre of chest non radiating pain. No N/v Dissapears on its own x 30 mins..   Chief Complaint  Patient presents with   Rib Fracture    Here to follow up on his Rib Fracture    Relevant past medical, surgical, family and social history reviewed and updated as indicated. Interim medical history since our last visit reviewed. Allergies and medications reviewed and updated.  Review of Systems  Constitutional:  Negative for fever.  HENT:  Negative for ear pain, rhinorrhea and sore throat.   Respiratory:  Positive for shortness of breath. Negative for  hemoptysis and sputum production.   Cardiovascular:  Negative for chest pain, claudication, leg swelling and syncope.  Gastrointestinal:  Negative for abdominal pain and vomiting.   Per HPI unless specifically indicated above     Objective:    BP 121/76   Pulse 82   Temp 98.8 F (37.1 C) (Oral)   Ht 6' 2.25" (1.886 m)   Wt 234 lb 9.6 oz (106.4 kg)   SpO2 97%   BMI 29.92 kg/m   Wt Readings from Last 3 Encounters:  10/15/20 234 lb 9.6 oz (106.4 kg)  09/14/20 235 lb 12.8 oz (107 kg)  08/31/20 233 lb 3.2 oz (105.8 kg)    Physical Exam Vitals and nursing note reviewed.  Constitutional:      General: He is not in acute distress.    Appearance: Normal appearance. He is not ill-appearing or diaphoretic.  HENT:     Head: Normocephalic and atraumatic.     Right Ear: Tympanic membrane and external ear normal. There is no impacted cerumen.     Left Ear: External ear normal.     Nose: No congestion or rhinorrhea.     Mouth/Throat:     Pharynx: No oropharyngeal exudate or posterior oropharyngeal erythema.  Eyes:     Conjunctiva/sclera: Conjunctivae normal.     Pupils: Pupils  are equal, round, and reactive to light.  Cardiovascular:     Rate and Rhythm: Normal rate and regular rhythm.     Heart sounds: No murmur heard.   No friction rub. No gallop.  Pulmonary:     Effort: No respiratory distress.     Breath sounds: No stridor. No wheezing or rhonchi.  Chest:     Chest wall: No tenderness.  Abdominal:     General: Abdomen is flat. Bowel sounds are normal.     Palpations: Abdomen is soft. There is no mass.     Tenderness: There is no abdominal tenderness.  Musculoskeletal:     Cervical back: Normal range of motion and neck supple. No rigidity or tenderness.     Left lower leg: No edema.  Skin:    General: Skin is warm and dry.  Neurological:     Mental Status: He is alert.    Results for orders placed or performed during the hospital encounter of 10/07/20  NM Myocar Multi  W/Spect W/Wall Motion / EF  Result Value Ref Range   Rest HR 61 bpm   Rest BP 116/81 mmHg   Exercise duration (sec) 45 sec   Percent HR 88 %   Exercise duration (min) 4 min   Estimated workload 6.7 METS   Peak HR 141 bpm   Peak BP 170/89 mmHg   SSS 3    SRS 3    SDS 0    TID 0.86    LV sys vol 34 mL   LV dias vol 81 62 - 150 mL        Current Outpatient Medications:    aspirin 81 MG chewable tablet, Chew 81 mg by mouth daily., Disp: , Rfl:    lidocaine (LIDODERM) 5 %, Place 1 patch onto the skin daily. Remove & Discard patch within 12 hours or as directed by MD, Disp: 30 patch, Rfl: 0   sacubitril-valsartan (ENTRESTO) 24-26 MG, Take 1 tablet by mouth 2 (two) times daily., Disp: 60 tablet, Rfl: 6   tamsulosin (FLOMAX) 0.4 MG CAPS capsule, Take 1 capsule (0.4 mg total) by mouth daily., Disp: 30 capsule, Rfl: 12    Assessment & Plan:   SOB: To have cardiopulmonary Eval for his SOB , as been having Chest pain.  NM myocardial test : 6/29 Blood pressure demonstrated a normal response to exercise. There was no ST segment deviation noted during stress. No T wave inversion was noted during stress. The study is normal. This is a low risk study. The left ventricular ejection fraction is normal. calculated value is 50%. EF appears higher visually. correlation with echo advised. There is no evidence for ischemia.   Atypical Chest pain  Cardiac wu ongoing Will start omeprazole x 3 months. If helps ? GERD  HLD will start pt on lipitor for such   recheck FLP, check LFT's work on diet, SE of meds explained to pt. low fat and high fiber diet explained to pt.   Problem List Items Addressed This Visit   None    Orders Placed This Encounter  Procedures   DG Bone Density   DG Ribs Unilateral Left   CBC with Differential/Platelet   CMP14+EGFR   Lipid panel   T4, free   TSH   Ambulatory referral to ENT     Meds ordered this encounter  Medications   omeprazole (PRILOSEC) 20  MG capsule    Sig: Take 1 capsule (20 mg total) by mouth daily.  Dispense:  30 capsule    Refill:  3   atorvastatin (LIPITOR) 20 MG tablet    Sig: Take 1 tablet (20 mg total) by mouth daily.    Dispense:  90 tablet    Refill:  3     Follow up plan: No follow-ups on file.

## 2020-10-16 NOTE — Telephone Encounter (Signed)
MyChart message sent to the patient asking if he would be ok with scheduling a follow up appointment.

## 2020-10-21 NOTE — Telephone Encounter (Signed)
The patient is scheduled to see Dr. Graciela Husbands on 11/03/20 at 3:40 pm.

## 2020-10-23 DIAGNOSIS — J342 Deviated nasal septum: Secondary | ICD-10-CM | POA: Diagnosis not present

## 2020-10-23 DIAGNOSIS — R040 Epistaxis: Secondary | ICD-10-CM | POA: Diagnosis not present

## 2020-11-03 ENCOUNTER — Ambulatory Visit (INDEPENDENT_AMBULATORY_CARE_PROVIDER_SITE_OTHER): Payer: BC Managed Care – PPO | Admitting: Internal Medicine

## 2020-11-03 ENCOUNTER — Other Ambulatory Visit: Payer: Self-pay

## 2020-11-03 ENCOUNTER — Encounter: Payer: Self-pay | Admitting: Internal Medicine

## 2020-11-03 VITALS — BP 112/80 | HR 59 | Ht 74.25 in | Wt 237.0 lb

## 2020-11-03 DIAGNOSIS — E782 Mixed hyperlipidemia: Secondary | ICD-10-CM

## 2020-11-03 DIAGNOSIS — R079 Chest pain, unspecified: Secondary | ICD-10-CM

## 2020-11-03 DIAGNOSIS — R072 Precordial pain: Secondary | ICD-10-CM

## 2020-11-03 DIAGNOSIS — R0600 Dyspnea, unspecified: Secondary | ICD-10-CM | POA: Diagnosis not present

## 2020-11-03 MED ORDER — METOPROLOL TARTRATE 50 MG PO TABS: ORAL_TABLET | ORAL | 0 refills | Status: DC

## 2020-11-03 MED ORDER — EMPAGLIFLOZIN 10 MG PO TABS: 10.0000 mg | ORAL_TABLET | Freq: Every day | ORAL | 6 refills | Status: DC

## 2020-11-03 MED ORDER — FUROSEMIDE 20 MG PO TABS: 20.0000 mg | ORAL_TABLET | Freq: Every day | ORAL | 3 refills | Status: DC

## 2020-11-03 MED ORDER — FUROSEMIDE 20 MG PO TABS: ORAL_TABLET | ORAL | 0 refills | Status: DC

## 2020-11-03 NOTE — Patient Instructions (Signed)
Medication Instructions:  - Your physician has recommended you make the following change in your medication:   1) START lasix (furosemide) 20 mg: - take 1 tablet (20 mg) by mouth once daily x 5 days  2) In 1 week START Jardiance 10 mg: - take 1 tablet (10 mg) by mouth once daily   Samples Given: Jardiance 10 mg Lot: [id number] Exp: 12/2021 # 3 bottles   *If you need a refill on your cardiac medications before your next appointment, please call your pharmacy*   Lab Work: - none ordered  If you have labs (blood work) drawn today and your tests are completely normal, you will receive your results only by: MyChart Message (if you have MyChart) OR A paper copy in the mail If you have any lab test that is abnormal or we need to change your treatment, we will call you to review the results.   Testing/Procedures:  1) Cardiac CTA:  -Your physician has requested that you have cardiac CT. Cardiac computed tomography (CT) is a painless test that uses an x-ray machine to take clear, detailed pictures of your heart.     Your cardiac CT will be scheduled at one of the below locations:   [facility] [address] [place], [place] [address] [phone]  OR  [facility] [address] [place], [place] [address] [phone]  If scheduled at [facility], please arrive at the [facility] main entrance (entrance A) of [facility] 30 minutes prior to test start time. Proceed to the [facility] Radiology Department (first floor) to check-in and test prep.  If scheduled at [facility], please arrive 15 mins early for check-in and test prep.  Please follow these instructions carefully (unless otherwise directed):  Hold all erectile dysfunction medications at least 3 days (72 hrs) prior to test.  On the Night Before the Test: Be sure to Drink plenty of water. Do not consume any  caffeinated/decaffeinated beverages or chocolate 12 hours prior to your test. Do not take any antihistamines 12 hours prior to your test.  On the Day of the Test: Drink plenty of water until 1 hour prior to the test. Do not eat any food 4 hours prior to the test. You may take your regular medications prior to the test.  Take metoprolol (Lopressor) two hours prior to test- see below   We are sending in Metoprolol tartarte 50 mg tablets (x 2 tablets).  The day of your test, please check your heart rate and follow the parameters as stated below:   Resting HR 50-60 bpm and BP >110/50 mmHG   Metoprolol tartrate 50 mg- take 0.5 tablet (25 mg) by mouth 90-120 min prior to scan) Resting HR 60-65 bpm and BP >110/50 mmHG  Metoprolol tartrate 50 mg- take 1 tablet (50 mg) by mouth 90-120 minutes prior to scan   Resting HR > 65 bpm and BP >110/50 mmHG  Metoprolol tartrate 50 mg- take 2 tablets (100 mg) by mouth 90-120 minutes prior to scan           After the Test: Drink plenty of water. After receiving IV contrast, you may experience a mild flushed feeling. This is normal. On occasion, you may experience a mild rash up to 24 hours after the test. This is not dangerous. If this occurs, you can take Benadryl 25 mg and increase your fluid intake. If you experience trouble breathing, this can be  serious. If it is severe call [phone] IMMEDIATELY. If it is mild, please call our office.   Please allow 2-4 weeks for scheduling of routine cardiac CTs. Some insurance companies require a pre-authorization which may delay scheduling of this test.   For non-scheduling related questions, please contact the cardiac imaging nurse navigator should you have any questions/concerns: Rockwell AlexandriaSara Wallace, Cardiac Imaging Nurse Navigator Larey BrickMerle Prescott, Cardiac Imaging Nurse Navigator [facility] Direct Office Dial: [phone]   For scheduling needs, including cancellations and rescheduling,  please call [place], [phone].     2) Pulmonary Function Tests: Your physician has recommended that you have a pulmonary function test. Pulmonary Function Tests are a group of tests that measure how well air moves in and out of your lungs.  Herbert Seta- Tecora Eustache, RN for Dr. Graciela HusbandsKlein will call you to schedule this  Follow-Up: At [facility], you and your health needs are our priority.  As part of our continuing mission to provide you with exceptional heart care, we have created designated Provider Care Teams.  These Care Teams include your primary Cardiologist (physician) and Advanced Practice Providers (APPs -  Physician Assistants and Nurse Practitioners) who all work together to provide you with the care you need, when you need it.  We recommend signing up for the patient portal called "MyChart".  Sign up information is provided on this After Visit Summary.  MyChart is used to connect with patients for Virtual Visits (Telemedicine).  Patients are able to view lab/test results, encounter notes, upcoming appointments, etc.  Non-urgent messages can be sent to your provider as well.   To learn more about what you can do with MyChart, go to [email or web address].    Your next appointment:   3 month(s)  The format for your next appointment:   In Person  Provider:   You may see Sherryl MangesSteven Klein, MD or one of the following Advanced Practice Providers on your designated Care Team:   Nicolasa Duckinghristopher Berge, NP Eula Listenyan Dunn, PA-C Marisue IvanJacquelyn Visser, PA-C Cadence Fransico MichaelFurth, [place]PA-C   Other Instructions   Cardiac CT Angiogram A cardiac CT angiogram is a procedure to look at the heart and the area around the heart. It may be done to help find the cause of chest pains or other symptoms of heart disease. During this procedure, a substance called contrast dye is injected into the blood vessels in the area to be checked. A large X-ray machine, called a CT scanner, then takes detailed pictures of the heart and the  surrounding area. The procedure is also sometimes called a coronary CTangiogram, coronary artery scanning, or CTA. A cardiac CT angiogram allows the health care provider to see how well blood is flowing to and from the heart. The health care provider will be able to see if there are any problems, such as: Blockage or narrowing of the coronary arteries in the heart. Fluid around the heart. Signs of weakness or disease in the muscles, valves, and tissues of the heart. Tell a health care provider about: Any allergies you have. This is especially important if you have had a previous allergic reaction to contrast dye. All medicines you are taking, including vitamins, herbs, eye drops, creams, and over-the-counter medicines. Any blood disorders you have. Any surgeries you have had. Any medical conditions you have. Whether you are pregnant or may be pregnant. Any anxiety disorders, chronic pain, or other conditions you have that may increase your stress or prevent you from lying still. What are the risks?  Generally, this is a safe procedure. However, problems may occur, including: Bleeding. Infection. Allergic reactions to medicines or dyes. Damage to other structures or organs. Kidney damage from the contrast dye that is used. Increased risk of cancer from radiation exposure. This risk is low. Talk with your health care provider about: The risks and benefits of testing. How you can receive the lowest dose of radiation. What happens before the procedure? Wear comfortable clothing and remove any jewelry, glasses, dentures, and hearing aids. Follow instructions from your health care provider about eating and drinking. This may include: For 12 hours before the procedure -- avoid caffeine. This includes tea, coffee, soda, energy drinks, and diet pills. Drink plenty of water or other fluids that do not have caffeine in them. Being well hydrated can prevent complications. For 4-6 hours before the  procedure -- stop eating and drinking. The contrast dye can cause nausea, but this is less likely if your stomach is empty. Ask your health care provider about changing or stopping your regular medicines. This is especially important if you are taking diabetes medicines, blood thinners, or medicines to treat problems with erections (erectile dysfunction). What happens during the procedure?  Hair on your chest may need to be removed so that small sticky patches called electrodes can be placed on your chest. These will transmit information that helps to monitor your heart during the procedure. An IV will be inserted into one of your veins. You might be given a medicine to control your heart rate during the procedure. This will help to ensure that good images are obtained. You will be asked to lie on an exam table. This table will slide in and out of the CT machine during the procedure. Contrast dye will be injected into the IV. You might feel warm, or you may get a metallic taste in your mouth. You will be given a medicine called nitroglycerin. This will relax or dilate the arteries in your heart. The table that you are lying on will move into the CT machine tunnel for the scan. The person running the machine will give you instructions while the scans are being done. You may be asked to: Keep your arms above your head. Hold your breath. Stay very still, even if the table is moving. When the scanning is complete, you will be moved out of the machine. The IV will be removed. The procedure may vary among health care providers and hospitals. What can I expect after the procedure? After your procedure, it is common to have: A metallic taste in your mouth from the contrast dye. A feeling of warmth. A headache from the nitroglycerin. Follow these instructions at home: Take over-the-counter and prescription medicines only as told by your health care provider. If you are told, drink enough fluid to  keep your urine pale yellow. This will help to flush the contrast dye out of your body. Most people can return to their normal activities right after the procedure. Ask your health care provider what activities are safe for you. It is up to you to get the results of your procedure. Ask your health care provider, or the department that is doing the procedure, when your results will be ready. Keep all follow-up visits as told by your health care provider. This is important. Contact a health care provider if: You have any symptoms of allergy to the contrast dye. These include: Shortness of breath. Rash or hives. A racing heartbeat. Summary A cardiac CT angiogram is a  procedure to look at the heart and the area around the heart. It may be done to help find the cause of chest pains or other symptoms of heart disease. During this procedure, a large X-ray machine, called a CT scanner, takes detailed pictures of the heart and the surrounding area after a contrast dye has been injected into blood vessels in the area. Ask your health care provider about changing or stopping your regular medicines before the procedure. This is especially important if you are taking diabetes medicines, blood thinners, or medicines to treat erectile dysfunction. If you are told, drink enough fluid to keep your urine pale yellow. This will help to flush the contrast dye out of your body. This information is not intended to replace advice given to you by your health care provider. Make sure you discuss any questions you have with your healthcare provider. Document Revised: 11/21/2018 Document Reviewed: 11/21/2018 Elsevier Patient Education  2022 Elsevier Inc.    Pulmonary Function Tests Pulmonary function tests (PFTs) are breathing tests that are used to measure how well your lungs work, find out what is causing your lung problems, and figure out the best treatment for you. You may have PFTs: When you have an illness  involving your lungs. To watch for changes in your lung function over time if you have a long-term (chronic) lung disease. If you are an IT trainer. PFTs check the effects of being exposed to chemicals over a long period of time. To check for diseases that affect the lungs, such as asthma or chronic obstructive pulmonary disease (COPD). To check lung function before having surgery or other procedures. To check your lungs, if you smoke. To check if prescribed medicines or treatments are helping your lungs. Your results will be compared with the expected lung function of someone with healthy lungs who is similar to you in several ways. These include age, sex, height, weight, and race or ethnicity. This is done to show how your lung function compares with normal lung function (percent predicted). The percent predicted helps your health care provider to know if your lung function is normal or not. If you have had PFTs done before, your health care provider will compare your current results with past results. This shows ifyour lung function is better, worse, or the same as before. Tell a health care provider about: Any allergies you have. All medicines you are taking, including inhaler or nebulizer medicines, vitamins, herbs, eye drops, creams, and over-the-counter medicines. Any blood disorders you have. Any surgeries you have had, especially recent surgery of the eye, abdomen, or chest. These can make PFTs difficult or unsafe. Any medical conditions you have, including chest pain or heart problems, tuberculosis, or respiratory infections such as pneumonia, a cold, or the flu. Any fear of being in closed spaces (claustrophobia). Some of your tests may be in a closed space. What are the risks? Generally, this is a safe procedure. However, problems may occur, including: Feeling light-headed due to fast, deep breathing known as over-breathing or hyperventilation. An asthma attack from deep  breathing. What happens before the procedure? Take over-the-counter and prescription medicines only as told by your health care provider. If you take inhaler or nebulizer medicines, ask your health care provider which medicines you should take on the day of your testing. Some inhaler medicines may interfere with PFTs if they are taken shortly before the tests. Follow instructions from your health care provider about eating or drinking restrictions. These may include avoiding  eating large meals and avoiding using caffeine before the testing. Do not drink alcohol for up to 4 hours before the test. Do not smoke for up to 4 hours before your procedure. This includes e-cigarettes. Wear comfortable clothing that will not interfere with breathing. Avoid exercise that takes a lot of effort (strenuous exercise) for at least 30 minutes before the testing. What happens during the procedure?  You will be given a soft nose clip to wear. This is done so all of your breaths will go through your mouth instead of your nose. You will be given a germ-free (sterile) mouthpiece. It will be attached to a machine, called a spirometer, that measures your breathing. You will be asked to do various breathing exercises. The exercises will be done by breathing in (inhaling) and breathing out (exhaling). You may be asked to repeat the exercises several times before the testing is complete. It is important to follow the instructions exactly to get accurate results. Make sure to blow as hard and as fast as you can when you are told to do so. You may be given a medicine called a bronchodilator that makes the small air passages in your lungs larger. This medicine will make it easier for you to breathe. The tests will then be repeated after the medicine takes effect. You will be watched carefully during the procedure for any problems, such as feeling faint or dizzy, or having trouble breathing. The procedure may vary among health care  providers and hospitals. What can I expect after the test procedure? It is up to you to get the results of your procedure. Ask your health care provider, or the department that is doing the procedure, when your results will be ready. After you have received your results, talk with your health careprovider about treatment options, if necessary. Follow these instructions at home: Do not use any products that contain nicotine or tobacco, such as cigarettes, e-cigarettes, and chewing tobacco. If you need help quitting, ask your healthcare provider. Summary Pulmonary function tests (PFTs) are used to measure how well your lungs work, find out what is causing your lung problems, and figure out the best treatment for you. If you have had PFTs done before, your health care provider will compare your current results with past results. This shows if your lung function is better, worse, or the same as before. Wear comfortable clothing that will not interfere with breathing when you are tested. It is up to you to get the results of your procedure. After you have received them, talk with your health care provider about treatment options, if necessary. This information is not intended to replace advice given to you by your health care provider. Make sure you discuss any questions you have with your healthcare provider. Document Revised: 03/26/2019 Document Reviewed: 03/26/2019 Elsevier Patient Education  2022 [employer].

## 2020-11-03 NOTE — Progress Notes (Signed)
ELECTROPHYSIOLOGY OFFICE NOTE  Patient ID: [name], MRN: [id number], DOB/AGE: 07-01-1960 60 y.o. Admit date: (Not on file) Date of Consult: 11/03/2020  Primary Physician: Loura Pardon, MD Primary Cardiologist:          HPI [name] is a 60 y.o. 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  initially seen for episode of chest pain associated with a "heart rate over 280". 7/15 catheterization demonstrated no obstructive disease; left ventricular function was normal. He had a loop recorder implanted--7/18 he had a "5-second pause" at 2048 hrs.  Efforts to contact him were unsuccessful.  11/18 his device reached RRT  10/21 he developed COVID-pneumonia.  Hospitalized and treated with monoclonal antibodies.  Improved but has continued to struggle with dyspnea on exertion. EVal included CXR >> atelectasis   Cardiac evaluation is as below, mild cardiomyopathy with a negative Myoview.  Continues however to have progressively worsening shortness of breath with exertion.  Also recurrent atypical chest pains.  Reviewed imaging studies, no comments made on calcification.  No edema   Weight is up about 10-20 pounds over the last few years most of which has been the last  year No longer smoking but he lives with his fiance and her mother both of whom smoke    DATE TEST EF   7/15 Cath  Normal CAs  7/16 ECHO  NORMAL   7/16 ETT NO atrial tachycardia   6/22 Echo  45%    6/22 Myoview  50% No perfusion defects   Date Cr K Hgb  10/21 0.8 3.2 15.9  5/22  0.6 3.9 16.4      Past Medical History:  Diagnosis Date   Chest pain with high risk for cardiac etiology 10/16/2013   Fatigue due to sleep pattern disturbance 10/16/2013   Implantable loop recorder    Medtronic Linq   Nonsustained ventricular tachycardia (HCC)    Identified on his Linq   Prostatism 10/16/2013   Prostatism    Sinus tachycardia    Syncope 10/16/2013   Tachycardia       Surgical History:  Past  Surgical History:  Procedure Laterality Date   LEFT HEART CATHETERIZATION WITH CORONARY ANGIOGRAM N/A 10/17/2013   Procedure: LEFT HEART CATHETERIZATION WITH CORONARY ANGIOGRAM;  Surgeon: Kathleene Hazel, MD;  Location: [facility] CATH LAB;  Service: Cardiovascular;  Laterality: N/A;   LOOP RECORDER IMPLANT  12-09-13   MDT LINQ implanted by Dr Graciela Husbands for syncope   LOOP RECORDER IMPLANT N/A 12/09/2013   Procedure: LOOP RECORDER IMPLANT;  Surgeon: Duke Salvia, MD;  Location: [facility] CATH LAB;  Service: Cardiovascular;  Laterality: N/A;     Home Meds: Current Meds  Medication Sig   aspirin 81 MG chewable tablet Chew 81 mg by mouth daily.   atorvastatin (LIPITOR) 20 MG tablet Take 1 tablet (20 mg total) by mouth daily.   lidocaine (LIDODERM) 5 % Place 1 patch onto the skin daily. Remove & Discard patch within 12 hours or as directed by MD   omeprazole (PRILOSEC) 20 MG capsule Take 1 capsule (20 mg total) by mouth daily.   sacubitril-valsartan (ENTRESTO) 24-26 MG Take 1 tablet by mouth 2 (two) times daily.   tamsulosin (FLOMAX) 0.4 MG CAPS capsule Take 1 capsule (0.4 mg total) by mouth daily.    Allergies:  Allergies  Allergen Reactions   Amoxicillin Rash        ROS:  Please see the history of present  illness.     All other systems reviewed and negative.   Physical Examination  BP 112/80 (BP Location: Left Arm, Patient Position: Sitting, Cuff Size: Normal)   Pulse (!) 59   Ht 6' 2.25" (1.886 m)   Wt 237 lb (107.5 kg)   SpO2 97%   BMI 30.22 kg/m  Well developed and nourished in no acute distress HENT normal Neck supple with JVP-  8 Clear Regular rate and rhythm, no murmurs or gallops Abd-soft with active BS No Clubbing cyanosis tredema Skin-warm and dry A & Oriented  Grossly normal sensory and motor function   CBJ:SEGBT 2 59 13/09/40 LVLL   Assessment and Plan:  Dyspnea on exertion  COVID  Pneumonia in the past  Cardiomyopathy-EF 45-50 with inferior wall motion  abnormality  Hyperlipidemia  Hypokalemia   Persistent dyspnea on exertion, and indeed somewhat worse.  Volume overloaded.  We will begin him on furosemide 20 mg x 5 doses.  Thereafter we will begin him on Jardiance 10..  We will have him come back in to about 6 or 8 weeks and see one of the PAs, and at that point can do the addition of spironolactone.  In the interim, I think we have discordant information from the Myoview and the echo the former suggesting no perfusion issues and the latter suggesting a discrete wall motion abnormality suggestive of an MI.  Adjudication is necessary given his worsening symptoms.  We will undertake CTA.  In the event that this is unilluminating, we will pursue pulmonary evaluation further with PFTs and referral.       Sherryl Manges

## 2020-11-06 ENCOUNTER — Other Ambulatory Visit: Payer: Self-pay | Admitting: *Deleted

## 2020-11-06 DIAGNOSIS — R0600 Dyspnea, unspecified: Secondary | ICD-10-CM

## 2020-11-09 ENCOUNTER — Telehealth: Payer: Self-pay | Admitting: Internal Medicine

## 2020-11-09 NOTE — Telephone Encounter (Signed)
Patient would like to discuss his labwork appointment that is scheduled for tomorrow, and also to change his PFT test that was scheduled. Please call to discuss.

## 2020-11-09 NOTE — Telephone Encounter (Signed)
The patient's pre-procedure COVID test has been scheduled for 12/02/20. I have sent a MyChart message to the patient just re-confirming all of his appointments.

## 2020-11-09 NOTE — Telephone Encounter (Signed)
I spoke with the patient. He states he is unable to come this week for his COVID test (8/2)/ PFT's (8/4) due to car issues. He states he will not have a way to come to these appointments.  I have advised I can reschedule these tests. He is scheduled for a Cardiac CT on 8/8 & a colonoscopy on 8/16.  I have inquired what date he would like to reschedule to and he advised anytime the week on 11/30/20.   He is aware I will call to reschedule his PFT's and COVID testing and will update this in his in MyChart.  The patient voices understanding and is agreeable.

## 2020-11-09 NOTE — Telephone Encounter (Signed)
I have rescheduled the patient's PFT's to 12/03/20 at 8:00 am (arrive at 7:45 am).   I have also attempted to call and r/s his COVID test for tomorrow. No answer at PAT. I have left a detailed message about the r/s date for his PFT's.  I have advised I was given a date of 2 days prior to his PFT's and left a message asking if this could just be done the day before.  I advised I would follow along in Epic to see what the r/s date was.  I will then send a detailed MyChart Message to the patient.

## 2020-11-10 ENCOUNTER — Inpatient Hospital Stay: Admission: RE | Admit: 2020-11-10 | Payer: BC Managed Care – PPO | Source: Ambulatory Visit

## 2020-11-12 ENCOUNTER — Ambulatory Visit: Payer: BC Managed Care – PPO

## 2020-11-13 ENCOUNTER — Telehealth (HOSPITAL_COMMUNITY): Payer: Self-pay | Admitting: Emergency Medicine

## 2020-11-13 NOTE — Telephone Encounter (Signed)
Attempted to call patient regarding upcoming cardiac CT appointment. °Left message on voicemail with name and callback number °Katelynd Blauvelt RN Navigator Cardiac Imaging °[facility] °[phone] Office °[phone] Cell ° °

## 2020-11-16 ENCOUNTER — Ambulatory Visit
Admission: RE | Admit: 2020-11-16 | Discharge: 2020-11-16 | Disposition: A | Discharge status: Home / Self Care | Payer: BC Managed Care – PPO | Source: Ambulatory Visit | Attending: Internal Medicine | Admitting: Internal Medicine

## 2020-11-16 ENCOUNTER — Other Ambulatory Visit: Payer: Self-pay

## 2020-11-16 DIAGNOSIS — R072 Precordial pain: Secondary | ICD-10-CM | POA: Insufficient documentation

## 2020-11-16 DIAGNOSIS — I700 Atherosclerosis of aorta: Secondary | ICD-10-CM

## 2020-11-16 HISTORY — DX: Atherosclerosis of aorta: I70.0

## 2020-11-16 LAB — POCT I-STAT CREATININE: Creatinine, Ser: 0.9 mg/dL (ref 0.61–1.24)

## 2020-11-16 MED ORDER — IOHEXOL 350 MG/ML SOLN
100.0000 mL | Freq: Once | INTRAVENOUS | Status: AC | PRN
  Administered 2020-11-16: 100 mL via INTRAVENOUS

## 2020-11-16 MED ORDER — NITROGLYCERIN 0.4 MG SL SUBL
0.8000 mg | SUBLINGUAL_TABLET | Freq: Once | SUBLINGUAL | Status: AC
  Administered 2020-11-16: 0.8 mg via SUBLINGUAL

## 2020-11-16 NOTE — Progress Notes (Signed)
Patient tolerated procedure well. Ambulate w/o difficulty. Denies light headedness or being dizzy. Sitting in chair drinking water provided. Encouraged to drink extra water today and reasoning explained. Verbalized understanding. All questions answered. ABC intact. No further needs. Discharge from procedure area w/o issues.   °

## 2020-11-20 ENCOUNTER — Other Ambulatory Visit: Payer: Self-pay | Admitting: Internal Medicine

## 2020-11-20 ENCOUNTER — Other Ambulatory Visit: Payer: BC Managed Care – PPO

## 2020-11-20 ENCOUNTER — Other Ambulatory Visit: Payer: Self-pay

## 2020-11-20 ENCOUNTER — Encounter: Payer: Self-pay | Admitting: Internal Medicine

## 2020-11-20 DIAGNOSIS — R000 Tachycardia, unspecified: Secondary | ICD-10-CM | POA: Diagnosis not present

## 2020-11-20 DIAGNOSIS — Z136 Encounter for screening for cardiovascular disorders: Secondary | ICD-10-CM

## 2020-11-20 DIAGNOSIS — Z1329 Encounter for screening for other suspected endocrine disorder: Secondary | ICD-10-CM | POA: Diagnosis not present

## 2020-11-20 DIAGNOSIS — K7689 Other specified diseases of liver: Secondary | ICD-10-CM

## 2020-11-20 NOTE — Telephone Encounter (Signed)
Per CT ordered by cards :  Pt has cysts will refer to GI - pl let him know thnx. Fu with me in 6 weeks please thnx. Around 20 th sept.    Visualized portions of the upper abdomen demonstrates multiple low-attenuation lesions in the liver, some of which are incompletely visualized. The largest of these lesions is in the central aspect between segments 4A and 8 (axial image 57 of series 19) measuring 3.5 x 3.4 cm, compatible with a large simple cyst. The other lesions are incompletely characterize, but also favored to represent small cysts and/or biliary hamartomas.   Retia Cordle

## 2020-11-20 NOTE — Telephone Encounter (Signed)
Called and left message for patient to return call, OK for PEC To give resutls

## 2020-11-21 LAB — CMP14+EGFR
ALT: 12 IU/L (ref 0–44)
AST: 15 IU/L (ref 0–40)
Albumin/Globulin Ratio: 1.6 (ref 1.2–2.2)
Albumin: 3.9 g/dL (ref 3.8–4.8)
Alkaline Phosphatase: 95 IU/L (ref 44–121)
BUN/Creatinine Ratio: 12 (ref 10–24)
BUN: 12 mg/dL (ref 8–27)
Bilirubin Total: 0.4 mg/dL (ref 0.0–1.2)
CO2: 22 mmol/L (ref 20–29)
Calcium: 9.1 mg/dL (ref 8.6–10.2)
Chloride: 105 mmol/L (ref 96–106)
Creatinine, Ser: 1 mg/dL (ref 0.76–1.27)
Globulin, Total: 2.5 g/dL (ref 1.5–4.5)
Glucose: 87 mg/dL (ref 65–99)
Potassium: 4.2 mmol/L (ref 3.5–5.2)
Sodium: 140 mmol/L (ref 134–144)
Total Protein: 6.4 g/dL (ref 6.0–8.5)
eGFR: 86 mL/min/{1.73_m2} (ref >59)

## 2020-11-21 LAB — LIPID PANEL
Chol/HDL Ratio: 4 ratio (ref 0.0–5.0)
Cholesterol, Total: 186 mg/dL (ref 100–199)
HDL: 47 mg/dL (ref >39)
LDL Chol Calc (NIH): 124 mg/dL — ABNORMAL HIGH (ref 0–99)
Triglycerides: 79 mg/dL (ref 0–149)
VLDL Cholesterol Cal: 15 mg/dL (ref 5–40)

## 2020-11-21 LAB — THYROID PANEL WITH TSH
Free Thyroxine Index: 1.6 (ref 1.2–4.9)
T3 Uptake Ratio: 23 % — ABNORMAL LOW (ref 24–39)
T4, Total: 7 ug/dL (ref 4.5–12.0)
TSH: 0.943 u[IU]/mL (ref 0.450–4.500)

## 2020-11-21 LAB — CBC WITH DIFFERENTIAL/PLATELET
Basophils Absolute: 0.1 10*3/uL (ref 0.0–0.2)
Basos: 1 %
EOS (ABSOLUTE): 0.1 10*3/uL (ref 0.0–0.4)
Eos: 2 %
Hematocrit: 48.2 % (ref 37.5–51.0)
Hemoglobin: 15.7 g/dL (ref 13.0–17.7)
Immature Grans (Abs): 0 10*3/uL (ref 0.0–0.1)
Immature Granulocytes: 0 %
Lymphocytes Absolute: 1.5 10*3/uL (ref 0.7–3.1)
Lymphs: 23 %
MCH: 29 pg (ref 26.6–33.0)
MCHC: 32.6 g/dL (ref 31.5–35.7)
MCV: 89 fL (ref 79–97)
Monocytes Absolute: 0.7 10*3/uL (ref 0.1–0.9)
Monocytes: 11 %
Neutrophils Absolute: 4.1 10*3/uL (ref 1.4–7.0)
Neutrophils: 63 %
Platelets: 251 10*3/uL (ref 150–450)
RBC: 5.42 x10E6/uL (ref 4.14–5.80)
RDW: 12.9 % (ref 11.6–15.4)
WBC: 6.5 10*3/uL (ref 3.4–10.8)

## 2020-11-23 ENCOUNTER — Encounter: Payer: Self-pay | Admitting: Gastroenterology

## 2020-11-23 ENCOUNTER — Telehealth: Payer: Self-pay | Admitting: Internal Medicine

## 2020-11-23 ENCOUNTER — Telehealth: Payer: Self-pay

## 2020-11-23 NOTE — Telephone Encounter (Signed)
I called and spoke with the patient regarding his Cardiac CT results.  As I was discussing this with him, an automated message came on the phone stating "all circuits are full," and the phone was disconnected.  I attempted to call the patient back. No answer- I left a message to please call me back as I was unsure how much of our conversation he had heard prior to the phone cutting off.

## 2020-11-23 NOTE — Telephone Encounter (Signed)
Called patient and informed him that he should be hearing from GI about referral for the cysts seen on his CT.

## 2020-11-23 NOTE — Telephone Encounter (Signed)
The patient called back after we were disconnected. I apologized for [place] being disconnected- I was not sure what happened. The patient was very understanding.  After discussing the results of the Cardiac portion of the Cardiac CT, I advised the patient I had seen his MyChart message about the liver lesions that were seen on the non-cardiac portion of the test.  I also advised that Dr. Charlotta Newton has seen the message as well and that it looks like her office had tried to reach out to him on Friday 8/12 with her recommendations.  I have asked that he follow back up with her office.  I also advised the patient that Dr. Graciela Husbands had signed off on his test on 11/18/20 and that since his repeat lipids were not done until 11/20/20, Dr. Graciela Husbands had not seen these results. I have advised the patient that since his LDL on 8/12 was 124, I will review with Dr. Graciela Husbands further as to whether he wants the patient to go ahead and increase his atorvastatin dose in light of the Cardiac CT findings.  The patient confirms that he is currently not having any issues on the atorvastatin 20  mg once daily dose.   I have confirmed with the patient that I will review this further with Dr. Graciela Husbands on 8/16 and he advised it is ok to send recommendations through his MyChart.   The patient voices understanding of the above recommendations and is agreeable.

## 2020-11-23 NOTE — Telephone Encounter (Signed)
Returned patients call. Explained to patient where he can find his instructions. He pulled the instructions up on his my chart and we went over them. Explained how to drink the bowel prep for golytely.

## 2020-11-23 NOTE — Telephone Encounter (Signed)
Duke Salvia, MD  11/18/2020  9:10 AM EDT Back to Top    Please Inform Patient that -CTA   is abnormal-- demonstrating first that there is NO significant block which is great news  but  second, atherosclerosis, which makes the statin and ASA appropriate.  Will need followup for LDL on statin which is ordered    Thanks

## 2020-11-23 NOTE — Telephone Encounter (Signed)
Patient requesting a call back from his PCP nurse regarding the message below. If unable to reach patient at [phone] please send a My Chart message.

## 2020-11-23 NOTE — Telephone Encounter (Signed)
Lets increase the atorvastatin to 40 mg daily and recheck in 3 months   suspect he will need crestor Thanks SK

## 2020-11-24 ENCOUNTER — Encounter: Payer: Self-pay | Admitting: Gastroenterology

## 2020-11-24 ENCOUNTER — Ambulatory Visit: Payer: BC Managed Care – PPO | Admitting: Anesthesiology

## 2020-11-24 ENCOUNTER — Ambulatory Visit
Admission: RE | Admit: 2020-11-24 | Discharge: 2020-11-24 | Disposition: A | Discharge status: Home / Self Care | Payer: BC Managed Care – PPO | Attending: Gastroenterology | Admitting: Gastroenterology

## 2020-11-24 ENCOUNTER — Encounter
Admission: RE | Disposition: A | Discharge status: Home / Self Care | Payer: Self-pay | Source: Home / Self Care | Attending: Gastroenterology

## 2020-11-24 DIAGNOSIS — Z7984 Long term (current) use of oral hypoglycemic drugs: Secondary | ICD-10-CM | POA: Insufficient documentation

## 2020-11-24 DIAGNOSIS — Z1211 Encounter for screening for malignant neoplasm of colon: Secondary | ICD-10-CM | POA: Diagnosis not present

## 2020-11-24 DIAGNOSIS — Z7982 Long term (current) use of aspirin: Secondary | ICD-10-CM | POA: Insufficient documentation

## 2020-11-24 DIAGNOSIS — Z79899 Other long term (current) drug therapy: Secondary | ICD-10-CM | POA: Insufficient documentation

## 2020-11-24 DIAGNOSIS — Z880 Allergy status to penicillin: Secondary | ICD-10-CM | POA: Diagnosis not present

## 2020-11-24 DIAGNOSIS — K5730 Diverticulosis of large intestine without perforation or abscess without bleeding: Secondary | ICD-10-CM | POA: Diagnosis not present

## 2020-11-24 DIAGNOSIS — Z800 Family history of malignant neoplasm of digestive organs: Secondary | ICD-10-CM | POA: Insufficient documentation

## 2020-11-24 HISTORY — PX: COLONOSCOPY WITH PROPOFOL: SHX5780

## 2020-11-24 HISTORY — DX: Gastro-esophageal reflux disease without esophagitis: K21.9

## 2020-11-24 SURGERY — COLONOSCOPY WITH PROPOFOL
Anesthesia: General

## 2020-11-24 MED ORDER — PROPOFOL 10 MG/ML IV BOLUS
INTRAVENOUS | Status: DC | PRN
  Administered 2020-11-24: 80 mg via INTRAVENOUS

## 2020-11-24 MED ORDER — SODIUM CHLORIDE 0.9 % IV SOLN: INTRAVENOUS | Status: DC

## 2020-11-24 MED ORDER — PROPOFOL 500 MG/50ML IV EMUL
INTRAVENOUS | Status: DC | PRN
  Administered 2020-11-24: 140 ug/kg/min via INTRAVENOUS

## 2020-11-24 NOTE — Op Note (Signed)
[facility] Patient Name: [name] Procedure Date: 11/24/2020 8:23 AM MRN: [id number] Account #: [id number] Date of Birth: Nov 27, 1960 Admit Type: Outpatient Age: 60 Room: [facility] ENDO ROOM 4 Gender: Male Note Status: Finalized Procedure:             Colonoscopy Indications:           Family history of colon cancer in a first-degree                         relative before age 67 years Providers:             [name] MD, MD Medicines:             Propofol per Anesthesia Complications:         No immediate complications. Procedure:             Pre-Anesthesia Assessment:                        - Prior to the procedure, a History and Physical was                         performed, and patient medications and allergies were                         reviewed. The patient's tolerance of previous                         anesthesia was also reviewed. The risks and benefits                         of the procedure and the sedation options and risks                         were discussed with the patient. All questions were                         answered, and informed consent was obtained. Prior                         Anticoagulants: The patient has taken no previous                         anticoagulant or antiplatelet agents. ASA Grade                         Assessment: II - A patient with mild systemic disease.                         After reviewing the risks and benefits, the patient                         was deemed in satisfactory condition to undergo the                         procedure.                        After obtaining informed consent, the colonoscope was  passed under direct vision. Throughout the procedure,                         the patient's blood pressure, pulse, and oxygen                         saturations were monitored continuously. The                         Colonoscope was introduced through the anus  and                         advanced to the the cecum, identified by appendiceal                         orifice and ileocecal valve. The colonoscopy was                         performed without difficulty. The patient tolerated                         the procedure well. The quality of the bowel                         preparation was excellent. Findings:      The perianal and digital rectal examinations were normal.      A few small-mouthed diverticula were found in the sigmoid colon. Impression:            - Diverticulosis in the sigmoid colon.                        - No specimens collected. Recommendation:        - Discharge patient to home.                        - Resume previous diet.                        - Continue present medications.                        - Repeat colonoscopy in 5 years for screening purposes. Procedure Code(s):     --- Professional ---                        [phone], Colonoscopy, flexible; diagnostic, including                         collection of specimen(s) by brushing or washing, when                         performed (separate procedure) Diagnosis Code(s):     --- Professional ---                        Z80.0, Family history of malignant neoplasm of                         digestive organs CPT copyright 2019 American Medical Association. All rights reserved. The codes documented in this report  are preliminary and upon coder review may  be revised to meet current compliance requirements. [name] MD, MD 11/24/2020 8:49:26 AM This report has been signed electronically. Number of Addenda: 0 Note Initiated On: 11/24/2020 8:23 AM Scope Withdrawal Time: 0 hours 7 minutes 59 seconds  Total Procedure Duration: 0 hours 11 minutes 58 seconds  Estimated Blood Loss:  Estimated blood loss: none.      [facility]

## 2020-11-24 NOTE — H&P (Signed)
[name], MD [facility] [address], [address] [place], [place] [address] [phone] Fax : [phone]  Primary Care Physician:  Loura Pardon, MD Primary Gastroenterologist:  Dr. Servando Snare  Pre-Procedure History & Physical: HPI:  [name] is a 60 y.o. male is here for an colonoscopy.   Past Medical History:  Diagnosis Date   Chest pain with high risk for cardiac etiology 10/16/2013   Fatigue due to sleep pattern disturbance 10/16/2013   GERD (gastroesophageal reflux disease)    Implantable loop recorder    Medtronic Linq   Nonsustained ventricular tachycardia (HCC)    Identified on his Linq   Prostatism 10/16/2013   Prostatism    Sinus tachycardia    Syncope 10/16/2013   Tachycardia     Past Surgical History:  Procedure Laterality Date   LEFT HEART CATHETERIZATION WITH CORONARY ANGIOGRAM N/A 10/17/2013   Procedure: LEFT HEART CATHETERIZATION WITH CORONARY ANGIOGRAM;  Surgeon: Kathleene Hazel, MD;  Location: [facility] CATH LAB;  Service: Cardiovascular;  Laterality: N/A;   LOOP RECORDER IMPLANT  12-09-13   MDT LINQ implanted by Dr Graciela Husbands for syncope   LOOP RECORDER IMPLANT N/A 12/09/2013   Procedure: LOOP RECORDER IMPLANT;  Surgeon: Duke Salvia, MD;  Location: [facility] CATH LAB;  Service: Cardiovascular;  Laterality: N/A;    Prior to Admission medications   Medication Sig Start Date End Date Taking? Authorizing Provider  aspirin 81 MG chewable tablet Chew 81 mg by mouth daily.   Yes [provider]  atorvastatin (LIPITOR) 20 MG tablet Take 1 tablet (20 mg total) by mouth daily. 10/15/20  Yes Vigg, Avanti, MD  empagliflozin (JARDIANCE) 10 MG TABS tablet Take 1 tablet (10 mg total) by mouth daily before breakfast. 11/03/20  Yes Duke Salvia, MD  omeprazole (PRILOSEC) 20 MG capsule Take 1 capsule (20 mg total) by mouth daily. 10/15/20  Yes Vigg, Avanti, MD  sacubitril-valsartan (ENTRESTO) 24-26 MG Take 1 tablet by mouth 2 (two) times daily. 10/08/20  Yes Duke Salvia,  MD  tamsulosin (FLOMAX) 0.4 MG CAPS capsule Take 1 capsule (0.4 mg total) by mouth daily. 09/14/20  Yes Vigg, Avanti, MD  furosemide (LASIX) 20 MG tablet Take 1 tablet (20 mg) by mouth once daily x 5 days as directed Patient not taking: Reported on 11/16/2020 11/03/20   Duke Salvia, MD  lidocaine (LIDODERM) 5 % Place 1 patch onto the skin daily. Remove & Discard patch within 12 hours or as directed by MD Patient not taking: Reported on 11/16/2020 08/31/20   Loura Pardon, MD  metoprolol tartrate (LOPRESSOR) 50 MG tablet Take 2 tablets (100 mg) by mouth 2 hours prior to your Cardiac CT as directed Patient not taking: Reported on 11/24/2020 11/03/20   Duke Salvia, MD    Allergies as of 10/05/2020 - Review Complete 09/14/2020  Allergen Reaction Noted   Amoxicillin Rash 10/16/2013    Family History  Problem Relation Age of Onset   Hypertension Mother    Arrhythmia Mother    Heart disease Mother    Parkinson's disease Mother    Alzheimer's disease Mother    Deep vein thrombosis Father    Colon cancer Father    Sudden death Maternal Grandfather 48   ADD / ADHD Paternal Aunt    Deep vein thrombosis Paternal Aunt    Deep vein thrombosis Paternal Uncle     Social History   Socioeconomic History   Marital status: Widowed    Spouse name: Not on file  Number of children: Not on file   Years of education: Not on file   Highest education level: Not on file  Occupational History   Not on file  Tobacco Use   Smoking status: Never   Smokeless tobacco: Never  Vaping Use   Vaping Use: Never used  Substance and Sexual Activity   Alcohol use: No   Drug use: No   Sexual activity: Yes  Other Topics Concern   Not on file  Social History Narrative   Not on file   Social Determinants of Health   Financial Resource Strain: Not on file  Food Insecurity: Not on file  Transportation Needs: Not on file  Physical Activity: Not on file  Stress: Not on file  Social Connections: Not on file   Intimate Partner Violence: Not on file    Review of Systems: See HPI, otherwise negative ROS  Physical Exam: BP (!) 128/98   Pulse 82   Temp (!) 97.5 F (36.4 C) (Temporal)   Resp 18   Ht 6\' 3"  (1.905 m)   Wt 104.3 kg   SpO2 97%   BMI 28.75 kg/m  General:   Alert,  pleasant and cooperative in NAD Head:  Normocephalic and atraumatic. Neck:  Supple; no masses or thyromegaly. Lungs:  Clear throughout to auscultation.    Heart:  Regular rate and rhythm. Abdomen:  Soft, nontender and nondistended. Normal bowel sounds, without guarding, and without rebound.   Neurologic:  Alert and  oriented x4;  grossly normal neurologically.  Impression/Plan: [name] is here for an colonoscopy to be performed for father with colon cancer at 35  Risks, benefits, limitations, and alternatives regarding  colonoscopy have been reviewed with the patient.  Questions have been answered.  All parties agreeable.   24, MD  11/24/2020, 8:25 AM

## 2020-11-24 NOTE — Anesthesia Preprocedure Evaluation (Addendum)
Anesthesia Evaluation  Patient identified by MRN, date of birth, ID band Patient awake    Reviewed: Allergy & Precautions, NPO status , Patient's Chart, lab work & pertinent test results  Airway Mallampati: III  TM Distance: >3 FB Neck ROM: Full    Dental  (+) Chipped, Missing, Poor Dentition   Pulmonary neg pulmonary ROS,    Pulmonary exam normal breath sounds clear to auscultation       Cardiovascular Exercise Tolerance: Good hypertension, Pt. on medications + DOE  Normal cardiovascular examDysrhythmias: Nonsustained ventricular tachycardia   Rhythm:Regular Rate:Normal  Cardiomyopathy-EF 45-50 with inferior wall motion abnormality   Neuro/Psych negative neurological ROS  negative psych ROS   GI/Hepatic Neg liver ROS, GERD  Medicated and Controlled,  Endo/Other  negative endocrine ROS  Renal/GU negative Renal ROS  negative genitourinary   Musculoskeletal negative musculoskeletal ROS (+)   Abdominal Normal abdominal exam  (+)   Peds negative pediatric ROS (+)  Hematology negative hematology ROS (+)   Anesthesia Other Findings DATE TEST EF  7/15 Cath  Normal CAs 7/16 ECHO  NORMAL  7/16 ETT NO atrial tachycardia  6/22 Echo  45%   6/22 Myoview  50% No perfusion defects 11/2020 CT Coronary 1.  Aortic Atherosclerosis     Reproductive/Obstetrics negative OB ROS                           Anesthesia Physical Anesthesia Plan  ASA: 3  Anesthesia Plan: General   Post-op Pain Management:    Induction: Intravenous  PONV Risk Score and Plan: 2 and Propofol infusion and TIVA  Airway Management Planned: Natural Airway and Nasal Cannula  Additional Equipment:   Intra-op Plan:   Post-operative Plan:   Informed Consent: I have reviewed the patients History and Physical, chart, labs and discussed the procedure including the risks, benefits and alternatives for the proposed anesthesia  with the patient or authorized representative who has indicated his/her understanding and acceptance.     Dental Advisory Given  Plan Discussed with: Anesthesiologist, CRNA and Surgeon  Anesthesia Plan Comments: (Patient consented for risks of anesthesia including but not limited to:  - adverse reactions to medications - risk of airway placement if required - damage to eyes, teeth, lips or other oral mucosa - nerve damage due to positioning  - sore throat or hoarseness - Damage to heart, brain, nerves, lungs, other parts of body or loss of life  Patient voiced understanding.)       Anesthesia Quick Evaluation

## 2020-11-24 NOTE — Anesthesia Postprocedure Evaluation (Signed)
Anesthesia Post Note  Patient: [name]  Procedure(s) Performed: COLONOSCOPY WITH PROPOFOL  Patient location during evaluation: Endoscopy Anesthesia Type: General Level of consciousness: awake and alert Pain management: pain level controlled Vital Signs Assessment: post-procedure vital signs reviewed and stable Respiratory status: spontaneous breathing, nonlabored ventilation and respiratory function stable Cardiovascular status: blood pressure returned to baseline and stable Postop Assessment: no apparent nausea or vomiting Anesthetic complications: no   No notable events documented.   Last Vitals:  Vitals:   11/24/20 0910 11/24/20 0920  BP: 112/78 114/73  Pulse: 64 64  Resp: 20 20  Temp:    SpO2: 97% 97%    Last Pain:  Vitals:   11/24/20 0755  TempSrc: Temporal  PainSc: 0-No pain                 Foye Deer

## 2020-11-24 NOTE — Transfer of Care (Signed)
Immediate Anesthesia Transfer of Care Note  Patient: [name]  Procedure(s) Performed: COLONOSCOPY WITH PROPOFOL  Patient Location: PACU  Anesthesia Type:General  Level of Consciousness: sedated  Airway & Oxygen Therapy: Patient Spontanous Breathing  Post-op Assessment: Report given to RN and Post -op Vital signs reviewed and stable  Post vital signs: Reviewed and stable  Last Vitals:  Vitals Value Taken Time  BP 95/66 11/24/20 0849  Temp    Pulse 81 11/24/20 0851  Resp 19 11/24/20 0851  SpO2 92 % 11/24/20 0851  Vitals shown include unvalidated device data.  Last Pain:  Vitals:   11/24/20 0755  TempSrc: Temporal  PainSc: 0-No pain         Complications: No notable events documented.

## 2020-11-25 ENCOUNTER — Encounter: Payer: Self-pay | Admitting: Internal Medicine

## 2020-11-25 ENCOUNTER — Encounter: Payer: Self-pay | Admitting: Gastroenterology

## 2020-11-25 MED ORDER — ATORVASTATIN CALCIUM 40 MG PO TABS: 40.0000 mg | ORAL_TABLET | Freq: Every day | ORAL | 1 refills | Status: DC

## 2020-11-25 NOTE — Telephone Encounter (Signed)
I called and spoke with the patient. I have advised him of Dr. Odessa Fleming recommendation to: 1) Increase atorvastatin to 40 mg once daily 2) repeat a lipid/ liver panel in 3 months 3) if numbers still aren't improved in 3 months, then we may need to switch his statin therapy.  The patient voices understanding and is agreeable.

## 2020-11-26 ENCOUNTER — Ambulatory Visit: Payer: BC Managed Care – PPO | Admitting: Internal Medicine

## 2020-12-02 ENCOUNTER — Other Ambulatory Visit: Payer: Self-pay

## 2020-12-02 ENCOUNTER — Other Ambulatory Visit
Admission: RE | Admit: 2020-12-02 | Discharge: 2020-12-02 | Disposition: A | Discharge status: Home / Self Care | Payer: BC Managed Care – PPO | Source: Ambulatory Visit | Attending: Internal Medicine | Admitting: Internal Medicine

## 2020-12-02 DIAGNOSIS — Z01812 Encounter for preprocedural laboratory examination: Secondary | ICD-10-CM | POA: Insufficient documentation

## 2020-12-02 DIAGNOSIS — Z20822 Contact with and (suspected) exposure to covid-19: Secondary | ICD-10-CM | POA: Diagnosis not present

## 2020-12-03 ENCOUNTER — Ambulatory Visit: Payer: BC Managed Care – PPO | Attending: Internal Medicine

## 2020-12-03 DIAGNOSIS — R0600 Dyspnea, unspecified: Secondary | ICD-10-CM | POA: Diagnosis not present

## 2020-12-03 DIAGNOSIS — Z87891 Personal history of nicotine dependence: Secondary | ICD-10-CM | POA: Diagnosis not present

## 2020-12-03 LAB — SARS CORONAVIRUS 2 (TAT 6-24 HRS): SARS Coronavirus 2: NEGATIVE

## 2020-12-24 ENCOUNTER — Encounter: Payer: Self-pay | Admitting: Internal Medicine

## 2020-12-24 ENCOUNTER — Other Ambulatory Visit: Payer: Self-pay

## 2020-12-24 ENCOUNTER — Ambulatory Visit (INDEPENDENT_AMBULATORY_CARE_PROVIDER_SITE_OTHER): Payer: BC Managed Care – PPO | Admitting: Internal Medicine

## 2020-12-24 ENCOUNTER — Telehealth: Payer: Self-pay | Admitting: Internal Medicine

## 2020-12-24 VITALS — BP 102/69 | HR 60 | Temp 97.4°F | Ht 74.5 in | Wt 230.8 lb

## 2020-12-24 DIAGNOSIS — Z23 Encounter for immunization: Secondary | ICD-10-CM

## 2020-12-24 DIAGNOSIS — R0789 Other chest pain: Secondary | ICD-10-CM | POA: Insufficient documentation

## 2020-12-24 DIAGNOSIS — R942 Abnormal results of pulmonary function studies: Secondary | ICD-10-CM

## 2020-12-24 DIAGNOSIS — E782 Mixed hyperlipidemia: Secondary | ICD-10-CM

## 2020-12-24 DIAGNOSIS — R002 Palpitations: Secondary | ICD-10-CM | POA: Diagnosis not present

## 2020-12-24 DIAGNOSIS — T148XXA Other injury of unspecified body region, initial encounter: Secondary | ICD-10-CM | POA: Insufficient documentation

## 2020-12-24 DIAGNOSIS — R0600 Dyspnea, unspecified: Secondary | ICD-10-CM

## 2020-12-24 HISTORY — DX: Encounter for immunization: Z23

## 2020-12-24 HISTORY — DX: Other injury of unspecified body region, initial encounter: T14.8XXA

## 2020-12-24 NOTE — Telephone Encounter (Signed)
I have reviewed the patient's PFT results with Dr. Graciela Husbands. Per Dr. Graciela Husbands, due to Moderate Obstructive Airway Disease findings, would refer to pulmonary for further evaluation.  I have called and notified the patient of these results and Dr. Odessa Fleming recommendations. He voices understanding and is agreeable.  He is aware I will place a referral to [facility] and they should reach out to him in the next few days with an appointment.  He was appreciative for the call back.

## 2020-12-24 NOTE — Telephone Encounter (Signed)
Interpretation Summary  Spirometry Data Is Acceptable and Reproducible   Moderate Obstructive Airways Disease without Significant Broncho-Dilator Response +air trapping (increased RV)        Consider outpatient Pulmonary Consultation if needed   Clinical Correlation Advised

## 2020-12-24 NOTE — Progress Notes (Signed)
BP 102/69   Pulse 60   Temp (!) 97.4 F (36.3 C) (Oral)   Ht 6' 2.5" (1.892 m)   Wt 230 lb 12.8 oz (104.7 kg)   SpO2 97%   BMI 29.24 kg/m    Subjective:    Patient ID: [name], male    DOB: November 09, 1960, 60 y.o.   MRN: [id number]  Chief Complaint  Patient presents with  . Gastroesophageal Reflux    6 week f/up    HPI: [name] is a 60 y.o. male  Hepatic cysts   Palpitations  This is a chronic problem. The current episode started more than 1 year ago. The problem has been gradually improving.  Hyperlipidemia This is a chronic problem. The problem is controlled.   Chief Complaint  Patient presents with  . Gastroesophageal Reflux    6 week f/up    Relevant past medical, surgical, family and social history reviewed and updated as indicated. Interim medical history since our last visit reviewed. Allergies and medications reviewed and updated.  Review of Systems  Per HPI unless specifically indicated above     Objective:    BP 102/69   Pulse 60   Temp (!) 97.4 F (36.3 C) (Oral)   Ht 6' 2.5" (1.892 m)   Wt 230 lb 12.8 oz (104.7 kg)   SpO2 97%   BMI 29.24 kg/m   Wt Readings from Last 3 Encounters:  12/24/20 230 lb 12.8 oz (104.7 kg)  11/24/20 230 lb (104.3 kg)  11/03/20 237 lb (107.5 kg)    Physical Exam Vitals and nursing note reviewed.  Constitutional:      General: He is not in acute distress.    Appearance: Normal appearance. He is not ill-appearing or diaphoretic.  HENT:     Head: Normocephalic and atraumatic.     Right Ear: Tympanic membrane and external ear normal. There is no impacted cerumen.     Left Ear: External ear normal.     Nose: No congestion or rhinorrhea.     Mouth/Throat:     Pharynx: No oropharyngeal exudate or posterior oropharyngeal erythema.  Eyes:     Conjunctiva/sclera: Conjunctivae normal.     Pupils: Pupils are equal, round, and reactive to light.  Cardiovascular:     Rate and Rhythm: Normal rate and regular  rhythm.     Heart sounds: No murmur heard.   No friction rub. No gallop.  Pulmonary:     Effort: No respiratory distress.     Breath sounds: No stridor. No wheezing or rhonchi.  Chest:     Chest wall: No tenderness.  Abdominal:     Palpations: There is no mass.     Tenderness: There is no abdominal tenderness.  Musculoskeletal:     Cervical back: Normal range of motion and neck supple. No rigidity or tenderness.     Left lower leg: No edema.  Skin:    General: Skin is warm and dry.  Neurological:     Mental Status: He is alert.    Results for orders placed or performed during the hospital encounter of 12/02/20  SARS CORONAVIRUS 2 (TAT 6-24 HRS) Nasopharyngeal Nasopharyngeal Swab   Specimen: Nasopharyngeal Swab  Result Value Ref Range   SARS Coronavirus 2 NEGATIVE NEGATIVE        Current Outpatient Medications:  .  aspirin 81 MG chewable tablet, Chew 81 mg by mouth daily., Disp: , Rfl:  .  atorvastatin (LIPITOR) 40 MG tablet, Take 1  tablet (40 mg total) by mouth daily., Disp: 90 tablet, Rfl: 1 .  empagliflozin (JARDIANCE) 10 MG TABS tablet, Take 1 tablet (10 mg total) by mouth daily before breakfast., Disp: 30 tablet, Rfl: 6 .  furosemide (LASIX) 20 MG tablet, Take 1 tablet (20 mg) by mouth once daily x 5 days as directed, Disp: 10 tablet, Rfl: 0 .  metoprolol tartrate (LOPRESSOR) 50 MG tablet, Take 2 tablets (100 mg) by mouth 2 hours prior to your Cardiac CT as directed, Disp: 2 tablet, Rfl: 0 .  omeprazole (PRILOSEC) 20 MG capsule, Take 1 capsule (20 mg total) by mouth daily., Disp: 30 capsule, Rfl: 3 .  sacubitril-valsartan (ENTRESTO) 24-26 MG, Take 1 tablet by mouth 2 (two) times daily., Disp: 60 tablet, Rfl: 6 .  tamsulosin (FLOMAX) 0.4 MG CAPS capsule, Take 1 capsule (0.4 mg total) by mouth daily., Disp: 30 capsule, Rfl: 12    Assessment & Plan:  Atypical chest pain/ palpitations: ? 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 Metoprolol HR well ocntrolled at 90 today Per cards notes have discordant information from the Myoview and the echo the former suggesting no perfusion issues and the latter suggesting a discrete wall motion abnormality suggestive of an MI.  Cardiac wu ongoing Will start omeprazole x 3 months. If helps ? GERD Abnl Ct showing cysts in liver.   Ct chest :  Visualized portions of the upper abdomen demonstrates multiple low-attenuation lesions in the liver, some of which are incompletely visualized. The largest of these lesions is in the central aspect between segments 4A and 8 (axial image 57 of series 19) measuring 3.5 x 3.4 cm, compatible with a large simple cyst. The other lesions are incompletely characterize, but also favored to represent small cysts and/or biliary hamartomas.     HLD is now on 40 mg of lipitor for such  recheck FLP, check LFT's work on diet, SE of meds explained to pt. low fat and high fiber diet explained to pt.  Problem List Items Addressed This Visit   None Visit Diagnoses     Need for influenza vaccination    -  Primary   Relevant Orders   Flu Vaccine QUAD 62mo+IM (Fluarix, Fluzone & Alfiuria Quad PF)        Orders Placed This Encounter  Procedures  . Flu Vaccine QUAD 20mo+IM (Fluarix, Fluzone & Alfiuria Quad PF)     No orders of the defined types were placed in this encounter.    Follow up plan: No follow-ups on file.  Health Maintenance : DEXa scheduled.  Cscope 2022  PSA 2022 FLu vaccine : today

## 2020-12-26 ENCOUNTER — Other Ambulatory Visit: Payer: Self-pay

## 2020-12-26 DIAGNOSIS — Z136 Encounter for screening for cardiovascular disorders: Secondary | ICD-10-CM

## 2020-12-26 DIAGNOSIS — Z1329 Encounter for screening for other suspected endocrine disorder: Secondary | ICD-10-CM

## 2020-12-26 DIAGNOSIS — E782 Mixed hyperlipidemia: Secondary | ICD-10-CM

## 2020-12-26 DIAGNOSIS — Z125 Encounter for screening for malignant neoplasm of prostate: Secondary | ICD-10-CM

## 2021-01-13 ENCOUNTER — Other Ambulatory Visit: Payer: Self-pay | Admitting: Internal Medicine

## 2021-01-13 NOTE — Telephone Encounter (Signed)
Future OV 04/28/21 Approved per protocol.

## 2021-01-26 ENCOUNTER — Encounter: Payer: Self-pay | Admitting: Gastroenterology

## 2021-01-26 ENCOUNTER — Other Ambulatory Visit: Payer: Self-pay

## 2021-01-26 ENCOUNTER — Ambulatory Visit (INDEPENDENT_AMBULATORY_CARE_PROVIDER_SITE_OTHER): Payer: BC Managed Care – PPO | Admitting: Gastroenterology

## 2021-01-26 VITALS — BP 115/78 | HR 76 | Temp 98.0°F | Ht 74.5 in | Wt 229.8 lb

## 2021-01-26 DIAGNOSIS — K7689 Other specified diseases of liver: Secondary | ICD-10-CM | POA: Diagnosis not present

## 2021-01-26 NOTE — Progress Notes (Signed)
Primary Care Physician: Loura Pardon, MD  Primary Gastroenterologist:  Dr. [name]  Chief Complaint  Patient presents with   Hepatic cyst    HPI: [name] is a 60 y.o. male here after seeing me for colonoscopy in August of this year for and abnormal imaging of his liver.  The patient underwent a CT cardiac scoring and a liver cyst which was reported to be a simple cyst was found. There are other lesions that could not be delineated thought was suggestive of possible cyst versus hemangiomas.  The patient denies any symptoms but does report that his stools have been yellow and green.  There is no report of any unexplained weight loss fevers chills nausea vomiting lack stools or bloody stools.  Past Medical History:  Diagnosis Date   Chest pain with high risk for cardiac etiology 10/16/2013   Fatigue due to sleep pattern disturbance 10/16/2013   GERD (gastroesophageal reflux disease)    Implantable loop recorder    Medtronic Linq   Nonsustained ventricular tachycardia    Identified on his Linq   Prostatism 10/16/2013   Prostatism    Sinus tachycardia    Syncope 10/16/2013   Tachycardia     Current Outpatient Medications  Medication Sig Dispense Refill   aspirin 81 MG chewable tablet Chew 81 mg by mouth daily.     atorvastatin (LIPITOR) 40 MG tablet Take 1 tablet (40 mg total) by mouth daily. 90 tablet 1   empagliflozin (JARDIANCE) 10 MG TABS tablet Take 1 tablet (10 mg total) by mouth daily before breakfast. 30 tablet 6   furosemide (LASIX) 20 MG tablet Take 1 tablet (20 mg) by mouth once daily x 5 days as directed 10 tablet 0   metoprolol tartrate (LOPRESSOR) 50 MG tablet Take 2 tablets (100 mg) by mouth 2 hours prior to your Cardiac CT as directed 2 tablet 0   omeprazole (PRILOSEC) 20 MG capsule TAKE 1 CAPSULE BY MOUTH ONCE DAILY 90 capsule 1   sacubitril-valsartan (ENTRESTO) 24-26 MG Take 1 tablet by mouth 2 (two) times daily. 60 tablet 6   tamsulosin (FLOMAX) 0.4 MG  CAPS capsule Take 1 capsule (0.4 mg total) by mouth daily. 30 capsule 12   No current facility-administered medications for this visit.    Allergies as of 01/26/2021 - Review Complete 01/26/2021  Allergen Reaction Noted   Amoxicillin Rash 10/16/2013    ROS:  General: Negative for anorexia, weight loss, fever, chills, fatigue, weakness. ENT: Negative for hoarseness, difficulty swallowing , nasal congestion. CV: Negative for chest pain, angina, palpitations, dyspnea on exertion, peripheral edema.  Respiratory: Negative for dyspnea at rest, dyspnea on exertion, cough, sputum, wheezing.  GI: See history of present illness. GU:  Negative for dysuria, hematuria, urinary incontinence, urinary frequency, nocturnal urination.  Endo: Negative for unusual weight change.    Physical Examination:   BP 115/78 (BP Location: Right Arm, Patient Position: Sitting, Cuff Size: Large)   Pulse 76   Temp 98 F (36.7 C) (Oral)   Ht 6' 2.5" (1.892 m)   Wt 229 lb 12.8 oz (104.2 kg)   BMI 29.11 kg/m   General: Well-nourished, well-developed in no acute distress.  Eyes: No icterus. Conjunctivae pink. Lungs: Clear to auscultation bilaterally. Non-labored. Heart: Regular rate and rhythm, no murmurs rubs or gallops.  Abdomen: Bowel sounds are normal, nontender, nondistended, no hepatosplenomegaly or masses, no abdominal bruits or hernia , no rebound or guarding.   Extremities: No lower extremity edema. No clubbing  or deformities. Neuro: Alert and oriented x 3.  Grossly intact. Skin: Warm and dry, no jaundice.   Psych: Alert and cooperative, normal mood and affect.  Labs:    Imaging Studies: No results found.  Assessment and Plan:   [name] is a 60 y.o. y/o male who comes in today after being found to have cysts on his liver during a CT cardiac calcification exam. The patient has been told that the color his stools are likely caused by what he is eating and not of concern since the consistency of  the stools have not been reported to be either a change in bowel habits such as constipation or diarrhea.  The patient has also been told that better imaging of his liver should be entertained and I would like to order an MRI on him.  I sent a message to his cardiologist because the patient does have a loop recorder in place from previous cardiac monitoring that he states is no longer functional but still in place.  I am not sure that these are MRI safe and we'll get more information from his cardiologist.  Once I hear back from the cardiologist then I will decide which modality is best to delineate the liver lesions.  The patient has been explained the plan and agrees with it.     [name], MD. Clementeen Graham    Note: This dictation was prepared with Dragon dictation along with smaller phrase technology. Any transcriptional errors that result from this process are unintentional.

## 2021-02-03 ENCOUNTER — Encounter: Payer: Self-pay | Admitting: Internal Medicine

## 2021-02-03 ENCOUNTER — Ambulatory Visit: Payer: BC Managed Care – PPO | Admitting: Internal Medicine

## 2021-02-03 ENCOUNTER — Ambulatory Visit
Admission: RE | Admit: 2021-02-03 | Discharge: 2021-02-03 | Disposition: A | Discharge status: Home / Self Care | Payer: BC Managed Care – PPO | Source: Ambulatory Visit | Attending: Internal Medicine | Admitting: Internal Medicine

## 2021-02-03 ENCOUNTER — Other Ambulatory Visit: Payer: Self-pay

## 2021-02-03 DIAGNOSIS — Z20822 Contact with and (suspected) exposure to covid-19: Secondary | ICD-10-CM | POA: Diagnosis not present

## 2021-02-03 DIAGNOSIS — R0609 Other forms of dyspnea: Secondary | ICD-10-CM

## 2021-02-03 DIAGNOSIS — R0602 Shortness of breath: Secondary | ICD-10-CM | POA: Diagnosis not present

## 2021-02-03 MED ORDER — FAMOTIDINE 20 MG PO TABS: ORAL_TABLET | ORAL | 11 refills | Status: DC

## 2021-02-03 MED ORDER — OMEPRAZOLE 20 MG PO CPDR: DELAYED_RELEASE_CAPSULE | ORAL | Status: DC

## 2021-02-03 NOTE — Patient Instructions (Addendum)
Your lung function tests suggests you may have more reflux than you are aware of so I rec:  Omeprazole  40 mg (20 mg x 2 pills)    Take  30-60 min before first meal of the day and Pepcid (famotidine)  20 mg an hour before bedtime  x one month trial     GERD (REFLUX)  is an extremely common cause of respiratory symptoms just like yours , many times with no obvious heartburn at all.    It can be treated with medication, but also with lifestyle changes including elevation of the head of your bed (ideally with 6 -8inch blocks under the headboard of your bed),  Smoking cessation, avoidance of late meals, excessive alcohol, and avoid fatty foods, chocolate, peppermint, colas, red wine, and acidic juices such as orange juice.  NO MINT OR MENTHOL PRODUCTS SO NO COUGH DROPS  USE SUGARLESS CANDY INSTEAD (Jolley ranchers or Stover's or Life Savers) or even ice chips will also do - the key is to swallow to prevent all throat clearing. NO OIL BASED VITAMINS - use powdered substitutes.  Avoid fish oil when coughing.   I will let Dr Graciela Husbands know it may be the lopressor making you feel tired and short of breath, but you may need a CPST in the [facility] office (Dr Odessa Fleming office) to sort out the reason for your symptoms  Please remember to go to the  x-ray department  for your tests - we will call you with the results when they are available    Pulmonary follow up is as needed

## 2021-02-03 NOTE — Assessment & Plan Note (Addendum)
Onset was oct 2021 with covid ? Delta p 3 vaccinations - PFTs 12/03/20  Non-physiologic F/v curve (nl in effort independent portion with FEV1 still  @ 3liters = 71%  Which would not explain doe at this level of exertion.  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  If dx remains in doubt p 6 weeks rx would proceed to CPST next (defer to Dr Graciela Husbands)    Each maintenance medication was reviewed in detail including emphasizing most importantly the difference between maintenance and prns and under what circumstances the prns are to be triggered using an action plan format where appropriate.  Total time for H and P, chart review, counseling,  directly observing portions of ambulatory 02 saturation study/ and generating customized AVS unique to this office visit / same day charting  > 60 min

## 2021-02-03 NOTE — Progress Notes (Signed)
[name], male    DOB: April 15, 1960,   MRN: [id number]   Brief patient profile:  89 yowm never smoker lifelong problem with recurrent fall rhinitis mostly just lets it run its course s rx referred to pulmonary clinic in [facility]  02/03/2021 by Dr Graciela Husbands for doe / fatigue.          History of Present Illness  02/03/2021  Pulmonary/ 1st office eval/ Sherene Sires / [place]  Office s/p covid Oct 2021  Chief Complaint  Patient presents with   Consult    Dyspnea,   Dyspnea:  walking at work causes fatigue, sob at top of steps  about 50% back to baseline since covid assoc with mild orthostasis. Cough: none  Sleep: flat bed/ one pillow  SABA use: none Overt hb on prilosec 20 mg not ac   No obvious day to day or daytime variability or assoc excess/ purulent sputum or mucus plugs or hemoptysis or cp or chest tightness, subjective wheeze or overt sinus  symptoms.   Sleeping  without nocturnal  or early am exacerbation  of respiratory  c/o's or need for noct saba. Also denies any obvious fluctuation of symptoms with weather or environmental changes or other aggravating or alleviating factors except as outlined above   No unusual exposure hx or h/o childhood pna/ asthma or knowledge of premature birth.  Current Allergies, Complete Past Medical History, Past Surgical History, Family History, and Social History were reviewed in [employer] record.  ROS  The following are not active complaints unless bolded Hoarseness, sore throat, dysphagia, dental problems, itching, sneezing,  nasal congestion or discharge of excess mucus or purulent secretions, ear ache,   fever, chills, sweats, unintended wt loss or wt gain, classically pleuritic or exertional cp,  orthopnea pnd or arm/hand swelling  or leg swelling, presyncope, palpitations, abdominal pain, anorexia, nausea, vomiting, diarrhea  or change in bowel habits or change in bladder habits, change in stools or change in urine,  dysuria, hematuria,  rash, arthralgias, visual complaints, headache, numbness, weakness or ataxia or problems with walking or coordination,  change in mood or  memory.           Past Medical History:  Diagnosis Date   Chest pain with high risk for cardiac etiology 10/16/2013   Fatigue due to sleep pattern disturbance 10/16/2013   GERD (gastroesophageal reflux disease)    Implantable loop recorder    Medtronic Linq   Nonsustained ventricular tachycardia    Identified on his Linq   Prostatism 10/16/2013   Prostatism    Sinus tachycardia    Syncope 10/16/2013   Tachycardia     Outpatient Medications Prior to Visit  Medication Sig Dispense Refill   aspirin 81 MG chewable tablet Chew 81 mg by mouth daily.     atorvastatin (LIPITOR) 40 MG tablet Take 1 tablet (40 mg total) by mouth daily. 90 tablet 1   empagliflozin (JARDIANCE) 10 MG TABS tablet Take 1 tablet (10 mg total) by mouth daily before breakfast. 30 tablet 6   omeprazole (PRILOSEC) 20 MG capsule TAKE 1 CAPSULE BY MOUTH ONCE DAILY 90 capsule 1   sacubitril-valsartan (ENTRESTO) 24-26 MG Take 1 tablet by mouth 2 (two) times daily. 60 tablet 6   tamsulosin (FLOMAX) 0.4 MG CAPS capsule Take 1 capsule (0.4 mg total) by mouth daily. 30 capsule 12   furosemide (LASIX) 20 MG tablet Take 1 tablet (20 mg) by mouth once daily x 5 days as directed 10 tablet  0   metoprolol tartrate (LOPRESSOR) 50 MG tablet Take 2 tablets (100 mg) by mouth 2 hours prior to your Cardiac CT as directed 2 tablet 0   No facility-administered medications prior to visit.     Objective:     BP 90/70 (BP Location: Left Arm, Patient Position: Sitting, Cuff Size: Normal)   Pulse 65   Temp (!) 97.5 F (36.4 C) (Oral)   Ht 6\' 3"  (1.905 m)   Wt 231 lb 12.8 oz (105.1 kg)   SpO2 97%   BMI 28.97 kg/m   SpO2: 97 %  Amb wm nad    HEENT : pt wearing mask not removed for exam due to covid -19 concerns.    NECK :  without JVD/Nodes/TM/ nl carotid upstrokes  bilaterally   LUNGS: no acc muscle use,  Nl contour chest which is clear to A and P bilaterally without cough on insp or exp maneuvers   CV:  RRR  no s3 or murmur or increase in P2, and no edema   ABD:  soft and nontender with nl inspiratory excursion in the supine position. No bruits or organomegaly appreciated, bowel sounds nl  MS:  Nl gait/ ext warm without deformities, calf tenderness, cyanosis or clubbing No obvious joint restrictions   SKIN: warm and dry without lesions    NEURO:  alert, approp, nl sensorium with  no motor or cerebellar deficits apparent.   Labs  reviewed:      Chemistry      Component Value Date/Time   NA 140 11/20/2020 0822   NA 141 10/16/2013 0532   K 4.2 11/20/2020 0822   K 4.0 10/16/2013 0532   CL 105 11/20/2020 0822   CL 106 10/16/2013 0532   CO2 22 11/20/2020 0822   CO2 28 10/16/2013 0532   BUN 12 11/20/2020 0822   BUN 11 10/16/2013 0532   CREATININE 1.00 11/20/2020 0822   CREATININE 0.81 10/16/2013 0532      Component Value Date/Time   CALCIUM 9.1 11/20/2020 0822   CALCIUM 8.9 10/16/2013 0532   ALKPHOS 95 11/20/2020 0822   ALKPHOS 79 10/15/2013 1538   AST 15 11/20/2020 0822   AST 14 (L) 10/15/2013 1538   ALT 12 11/20/2020 0822   ALT 17 10/15/2013 1538   BILITOT 0.4 11/20/2020 0822   BILITOT 0.6 10/15/2013 1538        Lab Results  Component Value Date   WBC 6.5 11/20/2020   HGB 15.7 11/20/2020   HCT 48.2 11/20/2020   MCV 89 11/20/2020   PLT 251 11/20/2020     No results found for: DDIMER    Lab Results  Component Value Date   TSH 0.943 11/20/2020       I personally reviewed images and agree with radiology impression as follows:   Chest CT 11/16/20 cuts from coronary ct : nl   CXR PA and Lateral:   02/03/2021 :    I personally reviewed images  / impression as follows:    Nl lung volumes, minimal non-specific markings     Assessment   DOE (dyspnea on exertion) Onset was oct 2021 with covid ? Delta p 3  vaccinations - PFTs 12/03/20  Non-physiologic F/v curve (nl in effort independent portion with FEV1 still  @ 3liters = 71%  Which would not explain doe at this level of exertion.  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  If dx remains in doubt p 6 weeks rx would proceed to CPST next (defer to Dr Graciela Husbands)    Each maintenance medication was reviewed in detail including emphasizing most importantly the difference between maintenance and prns and under what circumstances the prns are to be triggered using an action plan format where appropriate.  Total time for H and P, chart review, counseling,  directly observing portions of ambulatory 02 saturation study/ and generating customized AVS unique to this office visit / same day charting  > 60 min                    Sandrea Hughs, MD 02/03/2021

## 2021-02-04 ENCOUNTER — Ambulatory Visit (INDEPENDENT_AMBULATORY_CARE_PROVIDER_SITE_OTHER): Payer: BC Managed Care – PPO | Admitting: Internal Medicine

## 2021-02-04 ENCOUNTER — Encounter: Payer: Self-pay | Admitting: Internal Medicine

## 2021-02-04 VITALS — BP 110/78 | HR 56 | Ht 75.0 in | Wt 223.0 lb

## 2021-02-04 DIAGNOSIS — I429 Cardiomyopathy, unspecified: Secondary | ICD-10-CM

## 2021-02-04 DIAGNOSIS — E782 Mixed hyperlipidemia: Secondary | ICD-10-CM

## 2021-02-04 DIAGNOSIS — R0600 Dyspnea, unspecified: Secondary | ICD-10-CM | POA: Diagnosis not present

## 2021-02-04 NOTE — Progress Notes (Signed)
ELECTROPHYSIOLOGY OFFICE NOTE  Patient ID: [name], MRN: [id number], DOB/AGE: 1961/01/20 60 y.o. Admit date: (Not on file) Date of Consult: 02/04/2021  Primary Physician: Brent Pardon, MD Primary Cardiologist:          HPI [name] is a 60 y.o. 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  initially seen for episode of chest pain associated with a "heart rate over 280". 7/15 catheterization demonstrated no obstructive disease; left ventricular function was normal. He had a loop recorder implanted--7/18 he had a "5-second pause" at 2048 hrs.  Efforts to contact him were unsuccessful.  11/18 his device reached RRT abandoned  10/21 he developed COVID-pneumonia.  Hospitalized and treated with monoclonal antibodies.  Improved but has continued to struggle with dyspnea on exertion. EVal included CXR >> atelectasis   Cardiac evaluation is as below, mild cardiomyopathy with a negative Myoview.  Continues however to have progressively worsening shortness of breath with exertion.  Also recurrent atypical chest pains.  Reviewed imaging studies, no comments made on calcification.  Being seen and evaluated by pulmonary; concern of vocal cord dysfunction.  Chest x-ray ordered.  Cardiopulmonary stress test recommended is a consideration.     The patient denies chest pain, nocturnal dyspnea, orthopnea or peripheral edema.  There have been no palpitations, lightheadedness or syncope.  Complains of shortness of breath but this is better with the Jardiance.[name]   DATE TEST EF   7/15 Cath  Normal CAs  7/16 ECHO  NORMAL   7/16 ETT NO atrial tachycardia   6/22 Echo  45%    6/22 Myoview  50% No perfusion defects   Date Cr K Hgb  10/21 0.8 3.2 15.9  5/22  0.6 3.9 16.4  8/22  4.2       Past Medical History:  Diagnosis Date   Chest pain with high risk for cardiac etiology 10/16/2013   Fatigue due to sleep pattern disturbance 10/16/2013   GERD (gastroesophageal reflux  disease)    Implantable loop recorder    Medtronic Linq   Nonsustained ventricular tachycardia    Identified on his Linq   Prostatism 10/16/2013   Prostatism    Sinus tachycardia    Syncope 10/16/2013   Tachycardia       Surgical History:  Past Surgical History:  Procedure Laterality Date   COLONOSCOPY WITH PROPOFOL N/A 11/24/2020   Procedure: COLONOSCOPY WITH PROPOFOL;  Surgeon: Brent Minium, MD;  Location[facility] ENDOSCOPY;  Service: Endoscopy;  Laterality: N/A;   LEFT HEART CATHETERIZATION WITH CORONARY ANGIOGRAM N/A 10/17/2013   Procedure: LEFT HEART CATHETERIZATION WITH CORONARY ANGIOGRAM;  Surgeon: Brent Hazel, MD;  Location: [facility] CATH LAB;  Service: Cardiovascular;  Laterality: N/A;   LOOP RECORDER IMPLANT  12-09-13   MDT LINQ implanted by Dr Graciela Husbands for syncope   LOOP RECORDER IMPLANT N/A 12/09/2013   Procedure: LOOP RECORDER IMPLANT;  Surgeon: Brent Salvia, MD;  Location: [facility] CATH LAB;  Service: Cardiovascular;  Laterality: N/A;     Home Meds: Current Meds  Medication Sig   aspirin 81 MG chewable tablet Chew 81 mg by mouth daily.   atorvastatin (LIPITOR) 40 MG tablet Take 1 tablet (40 mg total) by mouth daily.   empagliflozin (JARDIANCE) 10 MG TABS tablet Take 1 tablet (10 mg total) by mouth daily before breakfast.   famotidine (PEPCID) 20 MG tablet One an hour before bedtime   omeprazole (PRILOSEC) 20 MG capsule Take 2  x  30-60   min before first meal   sacubitril-valsartan (ENTRESTO) 24-26 MG Take 1 tablet by mouth 2 (two) times daily.   tamsulosin (FLOMAX) 0.4 MG CAPS capsule Take 1 capsule (0.4 mg total) by mouth daily.    Allergies:  Allergies  Allergen Reactions   Amoxicillin Rash        ROS:  Please see the history of present illness.     All other systems reviewed and negative.   Physical Examination  BP 110/78 (BP Location: Left Arm, Patient Position: Sitting, Cuff Size: Normal)   Pulse (!) 56   Ht 6\' 3"  (1.905 m)   Wt 223 lb (101.2 kg)   SpO2  98%   BMI 27.87 kg/m  Well developed and well nourished in no acute distress HENT normal Neck supple with JVP-flat Clear Device pocket well healed; without hematoma or erythema.  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  He is euvolemic.  No longer taking a diuretic and will hold off.  But we will continue Jardiance 10  Cardiomyopathy is borderline at 50%.  Continue Entresto.  With him being just above midrange ejection fraction, we will plan a repeat echo 3-6 months and make a decision about the initiation of GDMT beta-blocker  Hypokalemia is resolved.   Hyperlipidemia.  We will continue him on atorvastatin 40; need to recheck his LDL.  He may well benefit from the addition of ezetimibe        17/10/42

## 2021-02-04 NOTE — Patient Instructions (Addendum)
Medication Instructions:  - Your physician recommends that you continue on your current medications as directed. Please refer to the Current Medication list given to you today.   *If you need a refill on your cardiac medications before your next appointment, please call your pharmacy*   Lab Work: - none ordered  If you have labs (blood work) drawn today and your tests are completely normal, you will receive your results only by: MyChart Message (if you have MyChart) OR A paper copy in the mail If you have any lab test that is abnormal or we need to change your treatment, we will call you to review the results.   Testing/Procedures: - none ordered   Follow-Up: At [facility], you and your health needs are our priority.  As part of our continuing mission to provide you with exceptional heart care, we have created designated Provider Care Teams.  These Care Teams include your primary Cardiologist (physician) and Advanced Practice Providers (APPs -  Physician Assistants and Nurse Practitioners) who all work together to provide you with the care you need, when you need it.  We recommend signing up for the patient portal called "MyChart".  Sign up information is provided on this After Visit Summary.  MyChart is used to connect with patients for Virtual Visits (Telemedicine).  Patients are able to view lab/test results, encounter notes, upcoming appointments, etc.  Non-urgent messages can be sent to your provider as well.   To learn more about what you can do with MyChart, go to [email or web address].    Your next appointment:   1) 3-4 months with the PA/ NP for Dr. Graciela Husbands  2) 6 months with Dr. Graciela Husbands  The format for your next appointment:   In Person  Provider:   As above   Other Instructions N/a

## 2021-02-09 DIAGNOSIS — Z20822 Contact with and (suspected) exposure to covid-19: Secondary | ICD-10-CM | POA: Diagnosis not present

## 2021-03-01 ENCOUNTER — Other Ambulatory Visit: Payer: Self-pay

## 2021-03-01 DIAGNOSIS — K769 Liver disease, unspecified: Secondary | ICD-10-CM

## 2021-03-11 ENCOUNTER — Encounter: Payer: Self-pay | Admitting: Internal Medicine

## 2021-03-11 DIAGNOSIS — Z20822 Contact with and (suspected) exposure to covid-19: Secondary | ICD-10-CM | POA: Diagnosis not present

## 2021-03-11 NOTE — Telephone Encounter (Signed)
Will need to d/w pt about this and order appropriate tests at visit. Thnx.

## 2021-03-13 ENCOUNTER — Other Ambulatory Visit: Payer: BC Managed Care – PPO

## 2021-03-15 ENCOUNTER — Ambulatory Visit
Admission: RE | Admit: 2021-03-15 | Discharge: 2021-03-15 | Disposition: A | Discharge status: Home / Self Care | Payer: BC Managed Care – PPO | Source: Ambulatory Visit | Attending: Gastroenterology | Admitting: Gastroenterology

## 2021-03-15 ENCOUNTER — Other Ambulatory Visit: Payer: Self-pay

## 2021-03-15 DIAGNOSIS — K769 Liver disease, unspecified: Secondary | ICD-10-CM | POA: Insufficient documentation

## 2021-03-15 DIAGNOSIS — K7689 Other specified diseases of liver: Secondary | ICD-10-CM | POA: Diagnosis not present

## 2021-03-15 MED ORDER — GADOBUTROL 1 MMOL/ML IV SOLN
10.0000 mL | Freq: Once | INTRAVENOUS | Status: AC | PRN
  Administered 2021-03-15: 10 mL via INTRAVENOUS

## 2021-03-17 ENCOUNTER — Telehealth: Payer: Self-pay

## 2021-03-17 NOTE — Telephone Encounter (Signed)
Pt notified of MRI results through MyChart.

## 2021-03-17 NOTE — Telephone Encounter (Signed)
-----   Message from [name], MD sent at 03/17/2021  7:03 AM EST ----- Please let the patient know that his MRI showed multiple benign hepatic cysts without any suspicious lesions or other abnormalities found.

## 2021-04-07 ENCOUNTER — Other Ambulatory Visit: Payer: Self-pay | Admitting: Internal Medicine

## 2021-04-07 NOTE — Telephone Encounter (Signed)
This is a [place] pt 

## 2021-04-11 DIAGNOSIS — K219 Gastro-esophageal reflux disease without esophagitis: Secondary | ICD-10-CM

## 2021-04-11 HISTORY — DX: Gastro-esophageal reflux disease without esophagitis: K21.9

## 2021-04-12 DIAGNOSIS — Z20822 Contact with and (suspected) exposure to covid-19: Secondary | ICD-10-CM | POA: Diagnosis not present

## 2021-04-21 ENCOUNTER — Other Ambulatory Visit: Payer: BC Managed Care – PPO

## 2021-04-28 ENCOUNTER — Ambulatory Visit: Payer: BC Managed Care – PPO | Admitting: Internal Medicine

## 2021-05-07 ENCOUNTER — Other Ambulatory Visit: Payer: Self-pay | Admitting: Internal Medicine

## 2021-05-11 ENCOUNTER — Ambulatory Visit: Payer: BC Managed Care – PPO | Admitting: Nurse Practitioner

## 2021-05-12 ENCOUNTER — Other Ambulatory Visit: Payer: BC Managed Care – PPO

## 2021-05-18 ENCOUNTER — Ambulatory Visit: Payer: BC Managed Care – PPO | Admitting: Internal Medicine

## 2021-06-25 ENCOUNTER — Ambulatory Visit: Payer: BC Managed Care – PPO | Admitting: Medical

## 2021-06-28 NOTE — Progress Notes (Incomplete)
?Cardiology Office Note:   ? ?Date:  06/28/2021  ? ?ID:  [name], DOB 1960-05-05, MRN [id number] ? ?PCP:  [name], MD  ?[place] Cardiologist:  None  ?[place] Electrophysiologist:  None  ? ?Referring MD: [name], MD  ? ?Chief Complaint: *** ? ?History of Present Illness:   ? ?[name] is a 61 y.o. male with a hx of *** ? ?Past Medical History:  ?Diagnosis Date  ? Chest pain with high risk for cardiac etiology 10/16/2013  ? Fatigue due to sleep pattern disturbance 10/16/2013  ? GERD (gastroesophageal reflux disease)   ? Implantable loop recorder   ? Medtronic Linq  ? Nonsustained ventricular tachycardia   ? Identified on his Linq  ? Prostatism 10/16/2013  ? Prostatism   ? Sinus tachycardia   ? Syncope 10/16/2013  ? Tachycardia   ? ? ?Past Surgical History:  ?Procedure Laterality Date  ? COLONOSCOPY WITH PROPOFOL N/A 11/24/2020  ? Procedure: COLONOSCOPY WITH PROPOFOL;  Surgeon: Lucilla Lame, MD;  Location: [facility] ENDOSCOPY;  Service: Endoscopy;  Laterality: N/A;  ? LEFT HEART CATHETERIZATION WITH CORONARY ANGIOGRAM N/A 10/17/2013  ? Procedure: LEFT HEART CATHETERIZATION WITH CORONARY ANGIOGRAM;  Surgeon: Burnell Blanks, MD;  Location: [facility] CATH LAB;  Service: Cardiovascular;  Laterality: N/A;  ? LOOP RECORDER IMPLANT  12-09-13  ? MDT LINQ implanted by Dr [name] for syncope  ? LOOP RECORDER IMPLANT N/A 12/09/2013  ? Procedure: LOOP RECORDER IMPLANT;  Surgeon: Deboraha Sprang, MD;  Location: [facility] CATH LAB;  Service: Cardiovascular;  Laterality: N/A;  ? ? ?Current Medications: ?No outpatient medications have been marked as taking for the 07/01/21 encounter (Appointment) with Kathlen Mody, Emoree Sasaki H, PA-C.  ?  ? ?Allergies:   Amoxicillin  ? ?Social History  ? ?Socioeconomic History  ? Marital status: Widowed  ?  Spouse name: Not on file  ? Number of children: Not on file  ? Years of education: Not on file  ? Highest education level: Not on file  ?Occupational History  ? Not on file  ?Tobacco Use  ? Smoking  status: Never  ? Smokeless tobacco: Never  ?Vaping Use  ? Vaping Use: Never used  ?Substance and Sexual Activity  ? Alcohol use: No  ? Drug use: No  ? Sexual activity: Yes  ?Other Topics Concern  ? Not on file  ?Social History Narrative  ? Not on file  ? ?Social Determinants of Health  ? ?Financial Resource Strain: Not on file  ?Food Insecurity: Not on file  ?Transportation Needs: Not on file  ?Physical Activity: Not on file  ?Stress: Not on file  ?Social Connections: Not on file  ?  ? ?Family History: ?The patient's ***family history includes ADD / ADHD in his paternal aunt; Alzheimer's disease in his mother; Arrhythmia in his mother; Colon cancer in his father; Deep vein thrombosis in his father, paternal aunt, and paternal uncle; Heart disease in his mother; Hypertension in his mother; Parkinson's disease in his mother; Sudden death (age of onset: 73) in his maternal grandfather. ? ?ROS:   ?Please see the history of present illness.    ?*** All other systems reviewed and are negative. ? ?EKGs/Labs/Other Studies Reviewed:   ? ?The following studies were reviewed today: ?*** ? ?EKG:  EKG is *** ordered today.  The ekg ordered today demonstrates *** ? ?Recent Labs: ?11/20/2020: ALT 12; BUN 12; Creatinine, Ser 1.00; Hemoglobin 15.7; Platelets 251; Potassium 4.2; Sodium 140; TSH 0.943  ?Recent Lipid Panel ?   ?  Component Value Date/Time  ? CHOL 186 11/20/2020 0822  ? TRIG 79 11/20/2020 0822  ? HDL 47 11/20/2020 0822  ? CHOLHDL 4.0 11/20/2020 0822  ? LDLCALC 124 (H) 11/20/2020 [id number]  ? ? ? ?Risk Assessment/Calculations:   ?{Does this patient have ATRIAL FIBRILLATION?[phone]} ? ? ?Physical Exam:   ? ?VS:  There were no vitals taken for this visit.   ? ?Wt Readings from Last 3 Encounters:  ?02/04/21 223 lb (101.2 kg)  ?02/03/21 231 lb 12.8 oz (105.1 kg)  ?01/26/21 229 lb 12.8 oz (104.2 kg)  ?  ? ?GEN: *** Well nourished, well developed in no acute distress ?HEENT: Normal ?NECK: No JVD; No carotid bruits ?LYMPHATICS: No  lymphadenopathy ?CARDIAC: ***RRR, no murmurs, rubs, gallops ?RESPIRATORY:  Clear to auscultation without rales, wheezing or rhonchi  ?ABDOMEN: Soft, non-tender, non-distended ?MUSCULOSKELETAL:  No edema; No deformity  ?SKIN: Warm and dry ?NEUROLOGIC:  Alert and oriented x 3 ?PSYCHIATRIC:  Normal affect  ? ?ASSESSMENT:   ? ?No diagnosis found. ?PLAN:   ? ?In order of problems listed above: ? ?*** ? ?Disposition: Follow up {follow up:15908} with ***  ? ?Shared Decision Making/Informed Consent   ?{Are you ordering a CV Procedure (e.g. stress test, cath, DCCV, TEE, etc)?   Press F2        :[id number]  ? ? ?Signed, ?Haydon Kalmar Ninfa Meeker, PA-C  ?06/28/2021 3:52 PM    ?[place] ? ?

## 2021-07-01 ENCOUNTER — Ambulatory Visit: Payer: BC Managed Care – PPO | Admitting: Medical

## 2021-07-08 ENCOUNTER — Other Ambulatory Visit: Payer: Self-pay | Admitting: Internal Medicine

## 2021-07-16 ENCOUNTER — Ambulatory Visit: Payer: BC Managed Care – PPO | Admitting: Medical

## 2021-07-16 ENCOUNTER — Encounter: Payer: Self-pay | Admitting: Medical

## 2021-07-16 ENCOUNTER — Telehealth: Payer: Self-pay | Admitting: Internal Medicine

## 2021-07-16 VITALS — BP 110/80 | HR 74 | Ht 75.0 in | Wt 222.0 lb

## 2021-07-16 DIAGNOSIS — R0600 Dyspnea, unspecified: Secondary | ICD-10-CM | POA: Diagnosis not present

## 2021-07-16 DIAGNOSIS — I428 Other cardiomyopathies: Secondary | ICD-10-CM

## 2021-07-16 DIAGNOSIS — R079 Chest pain, unspecified: Secondary | ICD-10-CM | POA: Diagnosis not present

## 2021-07-16 DIAGNOSIS — I5022 Chronic systolic (congestive) heart failure: Secondary | ICD-10-CM

## 2021-07-16 DIAGNOSIS — E782 Mixed hyperlipidemia: Secondary | ICD-10-CM

## 2021-07-16 MED ORDER — SPIRONOLACTONE 25 MG PO TABS: 12.5000 mg | ORAL_TABLET | Freq: Every day | ORAL | 3 refills | Status: DC

## 2021-07-16 NOTE — Telephone Encounter (Signed)
Order placed fpr 2 wk bmp [facility]. ?

## 2021-07-16 NOTE — Telephone Encounter (Signed)
Patient needs BMET in 2 weeks ?Patient would like [facility] to be [facility] to [facility], will complete same day as ECHO  ?

## 2021-07-16 NOTE — Progress Notes (Signed)
?Cardiology Office Note:   ? ?Date:  07/16/2021  ? ?ID:  [name], DOB [birth year], MRN [id number] ? ?PCP:  [name], MD  ?[place] Cardiologist:  None  ?[place] Electrophysiologist:  None  ? ?Referring MD: [name], MD  ? ?Chief Complaint: 4 month follow-up ? ?History of Present Illness:   ? ?[name] is a 61 y.o. male with a hx of nonobstructive CAD, cardiomyopathy, HLD, h/o ILR implantation for syncope who presents for follow-up.  ? ?Heart cath in 2015 showed no obstructive CAD. ? ?Echo 09/29/20 showed LVEF 45-50%, G1DD, WMA, normal RV function, aortic dilation 29mm. Coronary CTA showed calcium score of 108, mild proximal LAD disease and minimal distal rca disease. Myoivew Lexiscan showed no significant ischemia, low risk and was overall normal study with EF50%.  ? ?Last seen by Dr. [name] 02/04/21 and no changes were made. Plan was to repeat an echo to assess EF in 3-6 months to make a decision about addition of BB. ? ?Today, the patient reports breathing has been a little worse. Also had one episodes of chest pain last week while walking. Pain radiated to the back. He denies any further episodes. He denies LLE, orthopnea, pnd. HE stays fairly active for the most part.  ? ?Past Medical History:  ?Diagnosis Date  ? Chest pain with high risk for cardiac etiology 10/16/2013  ? Fatigue due to sleep pattern disturbance 10/16/2013  ? GERD (gastroesophageal reflux disease)   ? Implantable loop recorder   ? Medtronic Linq  ? Nonsustained ventricular tachycardia ([place])   ? Identified on his Linq  ? Prostatism 10/16/2013  ? Prostatism   ? Sinus tachycardia   ? Syncope 10/16/2013  ? Tachycardia   ? ? ?Past Surgical History:  ?Procedure Laterality Date  ? COLONOSCOPY WITH PROPOFOL N/A 11/24/2020  ? Procedure: COLONOSCOPY WITH PROPOFOL;  Surgeon: Lucilla Lame, MD;  Location: [facility] ENDOSCOPY;  Service: Endoscopy;  Laterality: N/A;  ? LEFT HEART CATHETERIZATION WITH CORONARY ANGIOGRAM N/A 10/17/2013  ?  Procedure: LEFT HEART CATHETERIZATION WITH CORONARY ANGIOGRAM;  Surgeon: Burnell Blanks, MD;  Location: [facility] CATH LAB;  Service: Cardiovascular;  Laterality: N/A;  ? LOOP RECORDER IMPLANT  12-09-13  ? MDT LINQ implanted by Dr [name] for syncope  ? LOOP RECORDER IMPLANT N/A 12/09/2013  ? Procedure: LOOP RECORDER IMPLANT;  Surgeon: Deboraha Sprang, MD;  Location: [facility] CATH LAB;  Service: Cardiovascular;  Laterality: N/A;  ? ? ?Current Medications: ?Current Meds  ?Medication Sig  ? aspirin 81 MG chewable tablet Chew 81 mg by mouth daily.  ? atorvastatin (LIPITOR) 40 MG tablet TAKE 1 TABLET BY MOUTH ONCE DAILY  ? ENTRESTO 24-26 MG TAKE 1 TABLET BY MOUTH TWICE DAILY  ? famotidine (PEPCID) 20 MG tablet One an hour before bedtime  ? JARDIANCE 10 MG TABS tablet TAKE 1 TABLET BY MOUTH ONCE EVERY MORNING WITH BREAKFAST  ? omeprazole (PRILOSEC) 20 MG capsule Take 2  x 30-60   min before first meal  ? spironolactone (ALDACTONE) 25 MG tablet Take 0.5 tablets (12.5 mg total) by mouth daily.  ? tamsulosin (FLOMAX) 0.4 MG CAPS capsule Take 1 capsule (0.4 mg total) by mouth daily.  ?  ? ?Allergies:   Amoxicillin  ? ?Social History  ? ?Socioeconomic History  ? Marital status: Widowed  ?  Spouse name: Not on file  ? Number of children: Not on file  ? Years of education: Not on file  ? Highest education level: Not on  file  ?Occupational History  ? Not on file  ?Tobacco Use  ? Smoking status: Never  ? Smokeless tobacco: Never  ?Vaping Use  ? Vaping Use: Never used  ?Substance and Sexual Activity  ? Alcohol use: No  ? Drug use: No  ? Sexual activity: Yes  ?Other Topics Concern  ? Not on file  ?Social History Narrative  ? Not on file  ? ?Social Determinants of Health  ? ?Financial Resource Strain: Not on file  ?Food Insecurity: Not on file  ?Transportation Needs: Not on file  ?Physical Activity: Not on file  ?Stress: Not on file  ?Social Connections: Not on file  ?  ? ?Family History: ?The patient's family history includes ADD / ADHD in  his paternal aunt; Alzheimer's disease in his mother; Arrhythmia in his mother; Colon cancer in his father; Deep vein thrombosis in his father, paternal aunt, and paternal uncle; Heart disease in his mother; Hypertension in his mother; Parkinson's disease in his mother; Sudden death (age of onset: 31) in his maternal grandfather. ? ?ROS:   ?Please see the history of present illness.    ? All other systems reviewed and are negative. ? ?EKGs/Labs/Other Studies Reviewed:   ? ?The following studies were reviewed today: ? ?Echo 09/2020 ? 1. Left ventricular ejection fraction, by estimation, is 45 to 50%. The  ?left ventricle has mildly decreased function. The left ventricle  ?demonstrates regional wall motion abnormalities (see scoring  ?diagram/findings for description). Left ventricular  ?diastolic parameters are consistent with Grade I diastolic dysfunction  ?(impaired relaxation). There is severe hypokinesis of the left  ?ventricular, basal-mid inferior segment. The average left ventricular  ?global longitudinal strain is -15.5 %. The global  ?longitudinal strain is abnormal.  ? 2. Right ventricular systolic function is normal. The right ventricular  ?size is normal.  ? 3. The mitral valve is normal in structure. Mild mitral valve  ?regurgitation. No evidence of mitral stenosis.  ? 4. The aortic valve is tricuspid. Aortic valve regurgitation is not  ?visualized. No aortic stenosis is present.  ? 5. Aortic dilatation noted. There is borderline dilatation of the aortic  ?root, measuring 37 mm. There is mild dilatation of the ascending aorta,  ?measuring 39 mm. There is mild dilatation of the aortic arch, measuring 33  ?mm.  ? ?Myoview Lexiscan 09/2020 ?Narrative & Impression  ?Blood pressure demonstrated a normal response to exercise. ?There was no ST segment deviation noted during stress. ?No T wave inversion was noted during stress. ?The study is normal. ?This is a low risk study. ?The left ventricular ejection  fraction is normal. calculated value is 50%. EF appears higher visually. correlation with echo advised. ?There is no evidence for ischemia. ?   ? ? ?Cardiac CTA 11/2020 ?IMPRESSION: ?1. Coronary calcium score of 108. This was 66th percentile for age ?and sex matched control. ?  ?2. Normal coronary origin with right dominance. ?  ?3. Mild proximal LAD disease (25%), minimal distal RCA disease ?(<25%). ?  ?4. CAD-RADS 2. Mild non-obstructive CAD (25-49%). Consider ?non-atherosclerotic causes of chest pain. Consider preventive ?therapy and risk factor modification. ?  ?Electronically Signed: ?By: Kate Sable M.D. ?On: 11/16/2020 12:53 ? ?EKG:  EKG is not ordered today.   ?Recent Labs: ?11/20/2020: ALT 12; BUN 12; Creatinine, Ser 1.00; Hemoglobin 15.7; Platelets 251; Potassium 4.2; Sodium 140; TSH 0.943  ?Recent Lipid Panel ?   ?Component Value Date/Time  ? CHOL 186 11/20/2020 0822  ? TRIG 79 11/20/2020 0822  ?  HDL 47 11/20/2020 0822  ? CHOLHDL 4.0 11/20/2020 0822  ? [place] 124 (H) 11/20/2020 [id number]  ? ? ?Physical Exam:   ? ?VS:  BP 110/80 (BP Location: Left Arm, Patient Position: Sitting, Cuff Size: Normal)   Pulse 74   Ht 6\' 3"  (1.905 m)   Wt 222 lb (100.7 kg)   SpO2 94%   BMI 27.75 kg/m?    ? ?Wt Readings from Last 3 Encounters:  ?07/16/21 222 lb (100.7 kg)  ?02/04/21 223 lb (101.2 kg)  ?02/03/21 231 lb 12.8 oz (105.1 kg)  ?  ? ?GEN:  Well nourished, well developed in no acute distress ?HEENT: Normal ?NECK: No JVD; No carotid bruits ?LYMPHATICS: No lymphadenopathy ?CARDIAC: RRR, no murmurs, rubs, gallops ?RESPIRATORY:  Clear to auscultation without rales, wheezing or rhonchi  ?ABDOMEN: Soft, non-tender, non-distended ?MUSCULOSKELETAL:  No edema; No deformity  ?SKIN: Warm and dry ?NEUROLOGIC:  Alert and oriented x 3 ?PSYCHIATRIC:  Normal affect  ? ?ASSESSMENT:   ? ?1. Dyspnea, unspecified type   ?2. Non-ischemic cardiomyopathy ([place])   ?3. Chronic systolic heart failure ([place])   ?4. Chest pain of uncertain  etiology   ?5. Mixed hyperlipidemia   ? ?PLAN:   ? ?In order of problems listed above: ? ?Dyspnea ?NICM ?HFmrEF ?EF down to 45-50% by echo 09/2020 . Chest CTA showed nonobstructive CAD and Myoview was negative. He reports wors

## 2021-07-16 NOTE — Patient Instructions (Addendum)
Medication Instructions:  ?Your physician has recommended you make the following change in your medication:  ? ?START Spironolactone 12.5 mg once daily (this will be half a tablet) ? ?*If you need a refill on your cardiac medications before your next appointment, please call your pharmacy* ? ? ?Lab Work: ?BMET in 2 weeks.  ? ?If you have labs (blood work) drawn today and your tests are completely normal, you will receive your results only by: ?MyChart Message (if you have MyChart) OR ?A paper copy in the mail ?If you have any lab test that is abnormal or we need to change your treatment, we will call you to review the results. ? ? ?Testing/Procedures: ?Your physician has requested that you have an echocardiogram. Echocardiography is a painless test that uses sound waves to create images of your heart. It provides your doctor with information about the size and shape of your heart and how well your heart?s chambers and valves are working. This procedure takes approximately one hour. There are no restrictions for this procedure. ? ? ? ?Follow-Up: ?At [facility], you and your health needs are our priority.  As part of our continuing mission to provide you with exceptional heart care, we have created designated Provider Care Teams.  These Care Teams include your primary Cardiologist (physician) and Advanced Practice Providers (APPs -  Physician Assistants and Nurse Practitioners) who all work together to provide you with the care you need, when you need it. ? ? ?Your next appointment:   ?1 month(s) ? ?The format for your next appointment:   ?In Person ? ?Provider:   ?Sherryl Manges, MD or Cadence Fransico Michael, PA-C ?

## 2021-07-30 ENCOUNTER — Other Ambulatory Visit: Payer: BC Managed Care – PPO

## 2021-07-30 ENCOUNTER — Other Ambulatory Visit
Admission: RE | Admit: 2021-07-30 | Discharge: 2021-07-30 | Disposition: A | Discharge status: Home / Self Care | Payer: BC Managed Care – PPO | Source: Ambulatory Visit | Attending: Medical | Admitting: Medical

## 2021-07-30 DIAGNOSIS — R0600 Dyspnea, unspecified: Secondary | ICD-10-CM | POA: Insufficient documentation

## 2021-07-30 LAB — BASIC METABOLIC PANEL
Anion gap: 6 (ref 5–15)
BUN: 21 mg/dL (ref 8–23)
CO2: 28 mmol/L (ref 22–32)
Calcium: 8.7 mg/dL — ABNORMAL LOW (ref 8.9–10.3)
Chloride: 106 mmol/L (ref 98–111)
Creatinine, Ser: 0.9 mg/dL (ref 0.61–1.24)
GFR, Estimated: >60 mL/min (ref >60)
Glucose, Bld: 86 mg/dL (ref 70–99)
Potassium: 4 mmol/L (ref 3.5–5.1)
Sodium: 140 mmol/L (ref 135–145)

## 2021-08-05 ENCOUNTER — Other Ambulatory Visit: Payer: Self-pay | Admitting: Internal Medicine

## 2021-08-06 ENCOUNTER — Ambulatory Visit (INDEPENDENT_AMBULATORY_CARE_PROVIDER_SITE_OTHER): Payer: BC Managed Care – PPO

## 2021-08-06 DIAGNOSIS — I428 Other cardiomyopathies: Secondary | ICD-10-CM

## 2021-08-06 LAB — ECHOCARDIOGRAM COMPLETE
AR max vel: 3.99 cm2
AV Area VTI: 4.43 cm2
AV Area mean vel: 3.7 cm2
AV Mean grad: 3 mmHg
AV Peak grad: 6.2 mmHg
Ao pk vel: 1.24 m/s
Area-P 1/2: 2.03 cm2
Calc EF: 50.8 %
S' Lateral: 4.65 cm
Single Plane A2C EF: 46.7 %
Single Plane A4C EF: 54.6 %

## 2021-08-12 ENCOUNTER — Other Ambulatory Visit
Admission: RE | Admit: 2021-08-12 | Discharge: 2021-08-12 | Disposition: A | Discharge status: Home / Self Care | Payer: BC Managed Care – PPO | Source: Ambulatory Visit | Attending: Internal Medicine | Admitting: Internal Medicine

## 2021-08-12 ENCOUNTER — Ambulatory Visit: Payer: BC Managed Care – PPO | Admitting: Internal Medicine

## 2021-08-12 ENCOUNTER — Encounter: Payer: Self-pay | Admitting: Internal Medicine

## 2021-08-12 VITALS — BP 100/70 | HR 69 | Ht 75.0 in | Wt 229.2 lb

## 2021-08-12 DIAGNOSIS — R0600 Dyspnea, unspecified: Secondary | ICD-10-CM

## 2021-08-12 DIAGNOSIS — I5022 Chronic systolic (congestive) heart failure: Secondary | ICD-10-CM

## 2021-08-12 DIAGNOSIS — I428 Other cardiomyopathies: Secondary | ICD-10-CM

## 2021-08-12 DIAGNOSIS — I471 Supraventricular tachycardia: Secondary | ICD-10-CM

## 2021-08-12 DIAGNOSIS — E782 Mixed hyperlipidemia: Secondary | ICD-10-CM

## 2021-08-12 LAB — CBC
HCT: 47.5 % (ref 39.0–52.0)
Hemoglobin: 15.8 g/dL (ref 13.0–17.0)
MCH: 28.7 pg (ref 26.0–34.0)
MCHC: 33.3 g/dL (ref 30.0–36.0)
MCV: 86.4 fL (ref 80.0–100.0)
Platelets: 255 10*3/uL (ref 150–400)
RBC: 5.5 MIL/uL (ref 4.22–5.81)
RDW: 13 % (ref 11.5–15.5)
WBC: 6.5 10*3/uL (ref 4.0–10.5)
nRBC: 0 % (ref 0.0–0.2)

## 2021-08-12 LAB — BASIC METABOLIC PANEL
Anion gap: 6 (ref 5–15)
BUN: 22 mg/dL (ref 8–23)
CO2: 24 mmol/L (ref 22–32)
Calcium: 8.6 mg/dL — ABNORMAL LOW (ref 8.9–10.3)
Chloride: 107 mmol/L (ref 98–111)
Creatinine, Ser: 0.81 mg/dL (ref 0.61–1.24)
GFR, Estimated: >60 mL/min (ref >60)
Glucose, Bld: 100 mg/dL — ABNORMAL HIGH (ref 70–99)
Potassium: 4 mmol/L (ref 3.5–5.1)
Sodium: 137 mmol/L (ref 135–145)

## 2021-08-12 NOTE — Progress Notes (Signed)
? ? ? ? ?ELECTROPHYSIOLOGY OFFICE NOTE  ?Patient ID: [name], MRN: [id number], DOB/AGE: 1961-03-17 61 y.o. ?Admit date: (Not on file) ?Date of Consult: 08/12/2021 ? ?Primary Physician: [name], MD ?Primary Cardiologist:   ?  ?  ?  ? ?HPI ?[name] is a 61 y.o. male is seen in follow-up for nonischemic cardiomyopathy and a prior history of syncope ? ? initially seen for episode of chest pain associated with a "heart rate over 280". 7/15 catheterization demonstrated no obstructive disease; left ventricular function was normal. ?He had a loop recorder implanted--7/18 he had a "5-second pause" at 2048 hrs.  Efforts to contact him were unsuccessful.  11/18 his device reached RRT abandoned ? ?10/21 he developed COVID-pneumonia.  Hospitalized and treated with monoclonal antibodies.  Improved but has continued to struggle with dyspnea on exertion. EVal included CXR >> atelectasis  ? ?Cardiac evaluation was as below, mild cardiomyopathy with a negative Myoview.  Continues however to have progressively worsening shortness of breath with exertion.  Also recurrent atypical chest pains.  Reviewed imaging studies, no comments made on calcification.  Being seen and evaluated by pulmonary; concern of vocal cord dysfunction.  Chest x-ray ordered.  Cardiopulmonary stress test recommended is a consideration. ? ?Seen 4/23 with complaints of worsening dyspnea.  Started on spironolactone.  Dyspnea is better.  Also has a history of right greater than left edema.  This too is improved. ? 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  Lasted a minute or so.  Not associated with palpitations.  No diaphoresis, no chest pain this in the wake of the initiation of spironolactone but not associate with orthostatic lightheadedness.  Has a smart watch but did not notice the heart rate.    ? ?The patient denies chest pain, nocturnal dyspnea, orthopnea .  There have been no palpitations or  syncope. .  ? ?DATE TEST EF   ?7/15 Cath  Normal CAs  ?7/16 ECHO  NORMAL   ?7/16 ETT NO atrial tachycardia   ?6/22 Echo  45%    ?6/22 Myoview  50% No perfusion defects  ?8/22 CTA  CaScore 108 ?NonObstructive LAD/RCA  ?4/23 Echo  45-50%   ? ?Date Cr K Hgb  ?10/21 0.8 3.2 15.9  ?5/22  0.6 3.9 16.4  ?8/22  4.2   ?4/23 0.9 4.0   ? ? ? ? ?Past Medical History:  ?Diagnosis Date  ? Chest pain with high risk for cardiac etiology 10/16/2013  ? Fatigue due to sleep pattern disturbance 10/16/2013  ? GERD (gastroesophageal reflux disease)   ? Implantable loop recorder   ? Medtronic Linq  ? Nonsustained ventricular tachycardia ([place])   ? Identified on his Linq  ? Prostatism 10/16/2013  ? Prostatism   ? Sinus tachycardia   ? Syncope 10/16/2013  ? Tachycardia   ?   ? ?Surgical History:  ?Past Surgical History:  ?Procedure Laterality Date  ? COLONOSCOPY WITH PROPOFOL N/A 11/24/2020  ? Procedure: COLONOSCOPY WITH PROPOFOL;  Surgeon: Lucilla Lame, MD;  Location: [facility] ENDOSCOPY;  Service: Endoscopy;  Laterality: N/A;  ? LEFT HEART CATHETERIZATION WITH CORONARY ANGIOGRAM N/A 10/17/2013  ? Procedure: LEFT HEART CATHETERIZATION WITH CORONARY ANGIOGRAM;  Surgeon: Burnell Blanks, MD;  Location: [facility] CATH LAB;  Service: Cardiovascular;  Laterality: N/A;  ? LOOP RECORDER IMPLANT  12-09-13  ? MDT LINQ implanted by Dr [name] for syncope  ? LOOP RECORDER IMPLANT N/A 12/09/2013  ? Procedure: LOOP RECORDER IMPLANT;  Surgeon: Deboraha Sprang, MD;  Location: [facility] C[facility] LAB;  Service: Cardiovascular;  Laterality: N/A;  ?  ? ?Home Meds: ?Current Meds  ?Medication Sig  ? aspirin 81 MG chewable tablet Chew 81 mg by mouth daily.  ? atorvastatin (LIPITOR) 40 MG tablet TAKE 1 TABLET BY MOUTH ONCE DAILY  ? ENTRESTO 24-26 MG TAKE 1 TABLET BY MOUTH TWICE DAILY  ? famotidine (PEPCID) 20 MG tablet One an hour before bedtime  ? JARDIANCE 10 MG TABS tablet TAKE 1 TABLET BY MOUTH ONCE EVERY MORNING WITH BREAKFAST  ? omeprazole (PRILOSEC) 20 MG capsule Take 2  x 30-60    min before first meal  ? spironolactone (ALDACTONE) 25 MG tablet Take 0.5 tablets (12.5 mg total) by mouth daily.  ? tamsulosin (FLOMAX) 0.4 MG CAPS capsule Take 1 capsule (0.4 mg total) by mouth daily.  ? ? ?Allergies:  ?Allergies  ?Allergen Reactions  ? Amoxicillin Rash  ? ? ? ?  ? ?ROS:  Please see the history of present illness.     All other systems reviewed and negative.  ? ?Physical Examination ? ?BP 100/70 (BP Location: Left Arm, Patient Position: Sitting, Cuff Size: Normal)   Pulse 69   Ht _0  (1.905 m)   Wt 229 lb 4 oz (104 kg)   SpO2 99%   BMI 28.65 kg/m?  ?Well developed and well nourished in no acute distress ?HENT normal ?Neck supple with JVP-flat ?Clear ?Device pocket well healed; without hematoma or erythema.  There is no tethering  ?Regular rate and rhythm, no  gallop No  murmur ?Abd-soft with active BS ?No Clubbing cyanosis  edema ?Skin-warm and dry ?A & Oriented  Grossly normal sensory and motor function ? ?ECG sinus at 69 ?Intervals 15/10/37 ? ? ?Assessment and Plan:  ?Dyspnea on exertion- ? ?Lightheadedness ? ?COVID  Pneumonia in the past ? ?HFmrEF 45-50  ? ?Hyperlipidemia ? ?  ?  ?Dyspnea is better and he is euvolemic on the addition of the spironolactone.  We will continue the Jardiance 10 and check his metabolic profile. ? ?The episode of lightheadedness is a little bit concerning in the absence of orthostasis which might help [place] implicate the recent addition of the spironolactone to his Jardiance.  He unfortunately did not happen to notice anything on the smart watch; I have asked him to look back at the data on his smart watch to see if there was a tachycardia which was initial complaint when we first met him years and years ago ? ?I have asked him to be attentive to his smart watch data if the event recorder. ? ?Thankfully we have reassuring data and his recent echo and his Myoview that was about a year old. ? ? ? ? ? ?Virl Axe ? ?

## 2021-08-12 NOTE — Patient Instructions (Signed)
Medication Instructions:  ?- Your physician recommends that you continue on your current medications as directed. Please refer to the Current Medication list given to you today. ? ?*If you need a refill on your cardiac medications before your next appointment, please call your pharmacy* ? ? ?Lab Work: ?- Your physician recommends that you lab work today: ?BMP/ CBC ? ?Medical Mall Entrance at [facility] ?1st desk on the right to check in (REGISTRATION) ?Lab hours: Monday- Friday (7:30 am- 5:30 pm) ? ?If you have labs (blood work) drawn today and your tests are completely normal, you will receive your results only by: ?MyChart Message (if you have MyChart) OR ?A paper copy in the mail ?If you have any lab test that is abnormal or we need to change your treatment, we will call you to review the results. ? ? ?Testing/Procedures: ?- none ordered ? ? ?Follow-Up: ?At [facility], you and your health needs are our priority.  As part of our continuing mission to provide you with exceptional heart care, we have created designated Provider Care Teams.  These Care Teams include your primary Cardiologist (physician) and Advanced Practice Providers (APPs -  Physician Assistants and Nurse Practitioners) who all work together to provide you with the care you need, when you need it. ? ?We recommend signing up for the patient portal called "MyChart".  Sign up information is provided on this After Visit Summary.  MyChart is used to connect with patients for Virtual Visits (Telemedicine).  Patients are able to view lab/test results, encounter notes, upcoming appointments, etc.  Non-urgent messages can be sent to your provider as well.   ?To learn more about what you can do with MyChart, go to [email or web address].   ? ?Your next appointment:   ? ?1) 3 weeks with Cadence, PA (please cancel the appointment with her for tomorrow)  ? ?2) 1 year with Dr. Graciela Husbands ? ?The format for your next appointment:   ?In Person ? ?Provider:   ?As above    ? ? ?Other Instructions ?N/a ? ?Important Information About Sugar ? ? ? ? ? ? ?

## 2021-08-13 ENCOUNTER — Ambulatory Visit: Payer: BC Managed Care – PPO | Admitting: Medical

## 2021-08-30 ENCOUNTER — Other Ambulatory Visit: Payer: Self-pay | Admitting: Internal Medicine

## 2021-08-31 DIAGNOSIS — F4323 Adjustment disorder with mixed anxiety and depressed mood: Secondary | ICD-10-CM | POA: Diagnosis not present

## 2021-09-07 DIAGNOSIS — F4323 Adjustment disorder with mixed anxiety and depressed mood: Secondary | ICD-10-CM | POA: Diagnosis not present

## 2021-09-09 ENCOUNTER — Ambulatory Visit (INDEPENDENT_AMBULATORY_CARE_PROVIDER_SITE_OTHER): Payer: BC Managed Care – PPO | Admitting: Medical

## 2021-09-09 ENCOUNTER — Ambulatory Visit (INDEPENDENT_AMBULATORY_CARE_PROVIDER_SITE_OTHER): Payer: BC Managed Care – PPO

## 2021-09-09 ENCOUNTER — Encounter: Payer: Self-pay | Admitting: Medical

## 2021-09-09 VITALS — BP 110/76 | HR 77 | Ht 75.0 in | Wt 229.4 lb

## 2021-09-09 DIAGNOSIS — R002 Palpitations: Secondary | ICD-10-CM

## 2021-09-09 DIAGNOSIS — E782 Mixed hyperlipidemia: Secondary | ICD-10-CM

## 2021-09-09 DIAGNOSIS — R079 Chest pain, unspecified: Secondary | ICD-10-CM | POA: Diagnosis not present

## 2021-09-09 DIAGNOSIS — I428 Other cardiomyopathies: Secondary | ICD-10-CM | POA: Diagnosis not present

## 2021-09-09 DIAGNOSIS — I5022 Chronic systolic (congestive) heart failure: Secondary | ICD-10-CM

## 2021-09-09 NOTE — Progress Notes (Unsigned)
Cardiology Office Note:    Date:  09/11/2021   ID:  [name], DOB Jul 31, 1960, MRN [id number]  PCP:  Brent Cousins, MD  [facility] Cardiologist:  None  [facility] Electrophysiologist:  None   Referring MD: Brent Cousins, MD   Chief Complaint: 1 month follow-up  History of Present Illness:    [name] is a 61 y.o. male with a hx of nonobstructive CAD, cardiomyopathy, HLD, h/o ILR implantation for syncope who presents for follow-up.    Heart cath in 2015 showed no obstructive CAD.   Echo 09/29/20 showed LVEF 45-50%, G1DD, WMA, normal RV function, aortic dilation 45mm. Coronary CTA showed calcium score of 108, mild proximal LAD disease and minimal distal rca disease. Myoview Lexiscan showed no significant ischemia, low risk and was overall normal study with EF50%.    Last seen by Dr. Caryl [name] 02/04/21 and no changes were made. Plan was to repeat an echo to assess EF in 3-6 months to make a decision about addition of BB.  Last seen 07/16/21 and reported breathing was worse. Spironolactone was added.   Today, the patient reports sharp chest pain with associated elevated heart rates and palpitations. Reports heart rates up to 140s. No shortness of breath. It's lasting about 20-30 minutes. No recent fever or chills. Eating and drinking normally.  Past Medical History:  Diagnosis Date   Chest pain with high risk for cardiac etiology 10/16/2013   Fatigue due to sleep pattern disturbance 10/16/2013   GERD (gastroesophageal reflux disease)    Implantable loop recorder    Medtronic Linq   Nonsustained ventricular tachycardia ([place])    Identified on his Linq   Prostatism 10/16/2013   Prostatism    Sinus tachycardia    Syncope 10/16/2013   Tachycardia     Past Surgical History:  Procedure Laterality Date   COLONOSCOPY WITH PROPOFOL N/A 11/24/2020   Procedure: COLONOSCOPY WITH PROPOFOL;  Surgeon: Brent Lame, MD;  Location: [facility] ENDOSCOPY;  Service: Endoscopy;  Laterality: N/A;    LEFT HEART CATHETERIZATION WITH CORONARY ANGIOGRAM N/A 10/17/2013   Procedure: LEFT HEART CATHETERIZATION WITH CORONARY ANGIOGRAM;  Surgeon: Brent Blanks, MD;  Location: [facility] CATH LAB;  Service: Cardiovascular;  Laterality: N/A;   LOOP RECORDER IMPLANT  12-09-13   MDT LINQ implanted by Dr [name] for syncope   LOOP RECORDER IMPLANT N/A 12/09/2013   Procedure: LOOP RECORDER IMPLANT;  Surgeon: Brent Sprang, MD;  Location: [facility] CATH LAB;  Service: Cardiovascular;  Laterality: N/A;    Current Medications: Current Meds  Medication Sig   aspirin 81 MG chewable tablet Chew 81 mg by mouth daily.   atorvastatin (LIPITOR) 40 MG tablet TAKE 1 TABLET BY MOUTH ONCE DAILY   ENTRESTO 24-26 MG TAKE 1 TABLET BY MOUTH TWICE DAILY   famotidine (PEPCID) 20 MG tablet One an hour before bedtime   JARDIANCE 10 MG TABS tablet TAKE 1 TABLET BY MOUTH ONCE EVERY MORNING WITH BREAKFAST   omeprazole (PRILOSEC) 20 MG capsule Take 2  x 30-60   min before first meal   spironolactone (ALDACTONE) 25 MG tablet Take 0.5 tablets (12.5 mg total) by mouth daily.   tamsulosin (FLOMAX) 0.4 MG CAPS capsule Take 1 capsule (0.4 mg total) by mouth daily.     Allergies:   Amoxicillin   Social History   Socioeconomic History   Marital status: Widowed    Spouse name: Not on file   Number of children: Not on file   Years of education: Not  on file   Highest education level: Not on file  Occupational History   Not on file  Tobacco Use   Smoking status: Never   Smokeless tobacco: Never  Vaping Use   Vaping Use: Never used  Substance and Sexual Activity   Alcohol use: No   Drug use: No   Sexual activity: Yes  Other Topics Concern   Not on file  Social History Narrative   Not on file   Social Determinants of Health   Financial Resource Strain: Not on file  Food Insecurity: Not on file  Transportation Needs: Not on file  Physical Activity: Not on file  Stress: Not on file  Social Connections: Not on file      Family History: The patient's family history includes ADD / ADHD in his paternal aunt; Alzheimer's disease in his mother; Arrhythmia in his mother; Colon cancer in his father; Deep vein thrombosis in his father, paternal aunt, and paternal uncle; Heart disease in his mother; Hypertension in his mother; Parkinson's disease in his mother; Sudden death (age of onset: 2) in his maternal grandfather.  ROS:   Please see the history of present illness.     All other systems reviewed and are negative.  EKGs/Labs/Other Studies Reviewed:    The following studies were reviewed today:    Echo 09/2020  1. Left ventricular ejection fraction, by estimation, is 45 to 50%. The  left ventricle has mildly decreased function. The left ventricle  demonstrates regional wall motion abnormalities (see scoring  diagram/findings for description). Left ventricular  diastolic parameters are consistent with Grade I diastolic dysfunction  (impaired relaxation). There is severe hypokinesis of the left  ventricular, basal-mid inferior segment. The average left ventricular  global longitudinal strain is -15.5 %. The global  longitudinal strain is abnormal.   2. Right ventricular systolic function is normal. The right ventricular  size is normal.   3. The mitral valve is normal in structure. Mild mitral valve  regurgitation. No evidence of mitral stenosis.   4. The aortic valve is tricuspid. Aortic valve regurgitation is not  visualized. No aortic stenosis is present.   5. Aortic dilatation noted. There is borderline dilatation of the aortic  root, measuring 37 mm. There is mild dilatation of the ascending aorta,  measuring 39 mm. There is mild dilatation of the aortic arch, measuring 33  mm.    Myoview Lexiscan 09/2020 Narrative & Impression  Blood pressure demonstrated a normal response to exercise. There was no ST segment deviation noted during stress. No T wave inversion was noted during stress. The study  is normal. This is a low risk study. The left ventricular ejection fraction is normal. calculated value is 50%. EF appears higher visually. correlation with echo advised. There is no evidence for ischemia.        Cardiac CTA 11/2020 IMPRESSION: 1. Coronary calcium score of 108. This was 66th percentile for age and sex matched control.   2. Normal coronary origin with right dominance.   3. Mild proximal LAD disease (25%), minimal distal RCA disease (<25%).   4. CAD-RADS 2. Mild non-obstructive CAD (25-49%). Consider non-atherosclerotic causes of chest pain. Consider preventive therapy and risk factor modification.   Electronically Signed: By: Kate Sable M.D. On: 11/16/2020 12:53   EKG:  EKG is  ordered today.  The ekg ordered today demonstrates NSR, 77bpm, LAD, nonspecific T wave changes  Recent Labs: 11/20/2020: ALT 12; TSH 0.943 08/12/2021: BUN 22; Creatinine, Ser 0.81; Hemoglobin 15.8; Platelets  255; Potassium 4.0; Sodium 137  Recent Lipid Panel    Component Value Date/Time   CHOL 186 11/20/2020 0822   TRIG 79 11/20/2020 0822   HDL 47 11/20/2020 0822   CHOLHDL 4.0 11/20/2020 0822   LDLCALC 124 (H) 11/20/2020 0822       Physical Exam:    VS:  BP 110/76 (BP Location: Left Arm, Patient Position: Sitting, Cuff Size: Normal)   Pulse 77   Ht 6\' 3"  (1.905 m)   Wt 229 lb 6 oz (104 kg)   SpO2 96%   BMI 28.67 kg/m     Wt Readings from Last 3 Encounters:  09/09/21 229 lb 6 oz (104 kg)  08/12/21 229 lb 4 oz (104 kg)  07/16/21 222 lb (100.7 kg)     GEN:  Well nourished, well developed in no acute distress HEENT: Normal NECK: No JVD; No carotid bruits LYMPHATICS: No lymphadenopathy CARDIAC: RRR, no murmurs, rubs, gallops RESPIRATORY:  Clear to auscultation without rales, wheezing or rhonchi  ABDOMEN: Soft, non-tender, non-distended MUSCULOSKELETAL:  No edema; No deformity  SKIN: Warm and dry NEUROLOGIC:  Alert and oriented x 3 PSYCHIATRIC:  Normal affect    ASSESSMENT:    1. Chest pain of uncertain etiology   2. Chronic systolic heart failure ([place])   3. Non-ischemic cardiomyopathy (H[facility])   4. Palpitations   5. Hyperlipidemia, mixed    PLAN:    In order of problems listed above:  Chest pain/palpitations Nonobstructive CAD He reports intermittent sharp chest pain with associated elevated heart rates up to 140s. Low suspicion for ACS given nonobstructive CAD on coronary CT and prior negative Myoview. EKS shows NSR with no changes. I will order a 2 week heart monitor and see him back to assess symptoms.   NICM HFmrEF Most recent echo showed LVEF 45-50%, which is unchanged from prior echo. Patient is euvolemic on exam today. Continue GDMT with Jardiance 10 mg daily, Entresto 24/26, and spironolactone 12.5mg  daily.   HLD LDL 124 11/2020, continue Lipitor 40mg  daily, can increase at follow-up.   Disposition: Follow up in 6 week(s) with MD/APP     Signed, Toriano Aikey Ninfa Meeker, PA-C  09/11/2021 10:08 AM    [facility]

## 2021-09-09 NOTE — Patient Instructions (Signed)
Medication Instructions:   Your physician recommends that you continue on your current medications as directed. Please refer to the Current Medication list given to you today.   *If you need a refill on your cardiac medications before your next appointment, please call your pharmacy*   Lab Work: None ordered  If you have labs (blood work) drawn today and your tests are completely normal, you will receive your results only by: MyChart Message (if you have MyChart) OR A paper copy in the mail If you have any lab test that is abnormal or we need to change your treatment, we will call you to review the results.   Testing/Procedures:  Your provider has ordered a heart monitor to wear for 14 days. This will be mailed to your home with instructions on placement. Once you have finished the time frame requested, you will return monitor in box provided.      Follow-Up: At [facility], you and your health needs are our priority.  As part of our continuing mission to provide you with exceptional heart care, we have created designated Provider Care Teams.  These Care Teams include your primary Cardiologist (physician) and Advanced Practice Providers (APPs -  Physician Assistants and Nurse Practitioners) who all work together to provide you with the care you need, when you need it.  We recommend signing up for the patient portal called "MyChart".  Sign up information is provided on this After Visit Summary.  MyChart is used to connect with patients for Virtual Visits (Telemedicine).  Patients are able to view lab/test results, encounter notes, upcoming appointments, etc.  Non-urgent messages can be sent to your provider as well.   To learn more about what you can do with MyChart, go to [email or web address].    Your next appointment:   6 week(s)  The format for your next appointment:   In Person  Provider:   You may see Sherryl Manges, MD or one of the following Advanced Practice Providers  on your designated Care Team:   Nicolasa Ducking, NP Eula Listen, PA-C Cadence Fransico Michael, [place]   Other Instructions N/A  Important Information About Sugar

## 2021-09-11 DIAGNOSIS — R079 Chest pain, unspecified: Secondary | ICD-10-CM

## 2021-09-21 ENCOUNTER — Other Ambulatory Visit: Payer: Self-pay | Admitting: Internal Medicine

## 2021-09-21 DIAGNOSIS — R399 Unspecified symptoms and signs involving the genitourinary system: Secondary | ICD-10-CM

## 2021-09-22 NOTE — Telephone Encounter (Signed)
Requested medication (s) are due for refill today:   Provider to review  Requested medication (s) are on the active medication list:   Yes  Future visit scheduled:   No   He has cancelled his last several appts   Last ordered: Prilosec 02/03/2021 no other information;   Flomax 09/14/2020 #30, 12 refills  Returned for provider to review for refills since he has cancelled several of his appts.   Requested Prescriptions  Pending Prescriptions Disp Refills   omeprazole (PRILOSEC) 20 MG capsule [Pharmacy Med Name: OMEPRAZOLE DR 20 MG CAP] 90 capsule     Sig: TAKE 1 CAPSULE BY MOUTH ONCE DAILY     Gastroenterology: Proton Pump Inhibitors Passed - 09/21/2021  2:48 PM      Passed - Valid encounter within last 12 months    Recent Outpatient Visits           9 months ago Need for influenza vaccination   [facility] Vigg, Avanti, MD   11 months ago Bleeding from the nose   [facility] Vigg, Avanti, MD   1 year ago Closed fracture of multiple ribs of left side with routine healing, subsequent encounter   [facility] Vigg, Avanti, MD   1 year ago Rib pain   [facility] Vigg, Avanti, MD   5 years ago Prostatitis, unspecified prostatitis type   [facility], Salley Hews, [place]       Future Appointments             In 1 month Furth, Cadence H, PA-C [facility]             tamsulosin (FLOMAX) 0.4 MG CAPS capsule [Pharmacy Med Name: TAMSULOSIN HCL 0.4 MG CAP] 90 capsule     Sig: TAKE 1 CAPSULE BY MOUTH ONCE DAILY     Urology: Alpha-Adrenergic Blocker Failed - 09/21/2021  2:48 PM      Failed - PSA in normal range and within 360 days    Prostate Specific Ag, Serum  Date Value Ref Range Status  08/31/2020 1.7 0.0 - 4.0 ng/mL Final    Comment:    Roche ECLIA methodology. According to the American Urological Association, Serum PSA should decrease and remain at undetectable levels after  radical prostatectomy. The AUA defines biochemical recurrence as an initial PSA value 0.2 ng/mL or greater followed by a subsequent confirmatory PSA value 0.2 ng/mL or greater. Values obtained with different assay methods or kits cannot be used interchangeably. Results cannot be interpreted as absolute evidence of the presence or absence of malignant disease.          Passed - Last BP in normal range    BP Readings from Last 1 Encounters:  09/09/21 110/76         Passed - Valid encounter within last 12 months    Recent Outpatient Visits           9 months ago Need for influenza vaccination   [facility] Vigg, Avanti, MD   11 months ago Bleeding from the nose   [facility] Vigg, Avanti, MD   1 year ago Closed fracture of multiple ribs of left side with routine healing, subsequent encounter   [facility] Vigg, Avanti, MD   1 year ago Rib pain   [facility] Vigg, Avanti, MD   5 years ago Prostatitis, unspecified prostatitis type   [facility], [place], [place]  Future Appointments             In 1 month Furth, Cadence H, PA-C [facility]

## 2021-09-24 ENCOUNTER — Other Ambulatory Visit: Payer: Self-pay | Admitting: Internal Medicine

## 2021-09-24 DIAGNOSIS — R399 Unspecified symptoms and signs involving the genitourinary system: Secondary | ICD-10-CM

## 2021-09-24 NOTE — Telephone Encounter (Signed)
Requested medication (s) are due for refill today: expired medication  Requested medication (s) are on the active medication list: yes  Last refill:  09/14/21 #30 12 refills  Future visit scheduled: no  Notes to clinic:  called patient to schedule appt no answer, LVMTCB. Expired medication do you want to renew Rx and give courtesy refill?     Requested Prescriptions  Pending Prescriptions Disp Refills   tamsulosin (FLOMAX) 0.4 MG CAPS capsule [Pharmacy Med Name: TAMSULOSIN HCL 0.4 MG CAP] 90 capsule     Sig: TAKE 1 CAPSULE BY MOUTH ONCE DAILY     Urology: Alpha-Adrenergic Blocker Failed - 09/24/2021  2:59 PM      Failed - PSA in normal range and within 360 days    Prostate Specific Ag, Serum  Date Value Ref Range Status  08/31/2020 1.7 0.0 - 4.0 ng/mL Final    Comment:    Roche ECLIA methodology. According to the American Urological Association, Serum PSA should decrease and remain at undetectable levels after radical prostatectomy. The AUA defines biochemical recurrence as an initial PSA value 0.2 ng/mL or greater followed by a subsequent confirmatory PSA value 0.2 ng/mL or greater. Values obtained with different assay methods or kits cannot be used interchangeably. Results cannot be interpreted as absolute evidence of the presence or absence of malignant disease.          Passed - Last BP in normal range    BP Readings from Last 1 Encounters:  09/09/21 110/76         Passed - Valid encounter within last 12 months    Recent Outpatient Visits           9 months ago Need for influenza vaccination   [facility] Vigg, Avanti, MD   11 months ago Bleeding from the nose   [facility] Vigg, Avanti, MD   1 year ago Closed fracture of multiple ribs of left side with routine healing, subsequent encounter   [facility] Vigg, Avanti, MD   1 year ago Rib pain   [facility] Vigg, Avanti, MD   5 years ago Prostatitis, unspecified  prostatitis type   [facility], [place], [place]       Future Appointments             In 1 month Furth, Cadence H, PA-C [facility], [facility]

## 2021-09-24 NOTE — Telephone Encounter (Signed)
Called patient to schedule appt for medication refills. No answer, LVMTCB.  

## 2021-09-24 NOTE — Telephone Encounter (Signed)
Requested Prescriptions  Pending Prescriptions Disp Refills  . tamsulosin (FLOMAX) 0.4 MG CAPS capsule [Pharmacy Med Name: TAMSULOSIN HCL 0.4 MG CAP] 30 capsule 0    Sig: TAKE 1 CAPSULE BY MOUTH ONCE DAILY     Urology: Alpha-Adrenergic Blocker Failed - 09/24/2021  4:46 PM      Failed - PSA in normal range and within 360 days    Prostate Specific Ag, Serum  Date Value Ref Range Status  08/31/2020 1.7 0.0 - 4.0 ng/mL Final    Comment:    Roche ECLIA methodology. According to the American Urological Association, Serum PSA should decrease and remain at undetectable levels after radical prostatectomy. The AUA defines biochemical recurrence as an initial PSA value 0.2 ng/mL or greater followed by a subsequent confirmatory PSA value 0.2 ng/mL or greater. Values obtained with different assay methods or kits cannot be used interchangeably. Results cannot be interpreted as absolute evidence of the presence or absence of malignant disease.          Passed - Last BP in normal range    BP Readings from Last 1 Encounters:  09/09/21 110/76         Passed - Valid encounter within last 12 months    Recent Outpatient Visits          9 months ago Need for influenza vaccination   [facility] Vigg, Avanti, MD   11 months ago Bleeding from the nose   [facility] Vigg, Avanti, MD   1 year ago Closed fracture of multiple ribs of left side with routine healing, subsequent encounter   [facility] Vigg, Avanti, MD   1 year ago Rib pain   [facility] Vigg, Avanti, MD   5 years ago Prostatitis, unspecified prostatitis type   [facility], [place], [place]      Future Appointments            In 4 days Vigg, Avanti, MD [facility]   In 1 month Furth, Cadence H, PA-C [facility], [facility]

## 2021-09-28 ENCOUNTER — Ambulatory Visit (INDEPENDENT_AMBULATORY_CARE_PROVIDER_SITE_OTHER): Payer: BC Managed Care – PPO | Admitting: Internal Medicine

## 2021-09-28 ENCOUNTER — Encounter: Payer: Self-pay | Admitting: Internal Medicine

## 2021-09-28 ENCOUNTER — Other Ambulatory Visit: Payer: Self-pay

## 2021-09-28 VITALS — BP 125/83 | HR 81 | Temp 97.5°F | Wt 225.8 lb

## 2021-09-28 DIAGNOSIS — I2583 Coronary atherosclerosis due to lipid rich plaque: Secondary | ICD-10-CM

## 2021-09-28 DIAGNOSIS — R399 Unspecified symptoms and signs involving the genitourinary system: Secondary | ICD-10-CM | POA: Diagnosis not present

## 2021-09-28 DIAGNOSIS — I2510 Atherosclerotic heart disease of native coronary artery without angina pectoris: Secondary | ICD-10-CM

## 2021-09-28 DIAGNOSIS — Z832 Family history of diseases of the blood and blood-forming organs and certain disorders involving the immune mechanism: Secondary | ICD-10-CM | POA: Insufficient documentation

## 2021-09-28 MED ORDER — TAMSULOSIN HCL 0.4 MG PO CAPS: 0.4000 mg | ORAL_CAPSULE | Freq: Every day | ORAL | 1 refills | Status: DC

## 2021-09-28 NOTE — Progress Notes (Signed)
BP 125/83   Pulse 81   Temp (!) 97.5 F (36.4 C) (Oral)   Wt 225 lb 12.8 oz (102.4 kg)   SpO2 96%   BMI 28.22 kg/m    Subjective:    Patient ID: [name], male    DOB: 28-Oct-1960, 61 y.o.   MRN: [id number]  Chief Complaint  Patient presents with   Medication Refill    Flomax. Refill - may need refills on others he is not sure    Blood Clots    Patient rpeorts HX of blood clots, states he would like to discuss testing options for him to see if he is susceptible to having them, states he has lost a lot of relatives recently d/t blood clots.     HPI: [name] is a 61 y.o. male  Pt has been concerned about blood clots 2 weeks ago lost his uncle and cousin to blood clots and circulation the patient denies any symptoms, sees cards   Benign Prostatic Hypertrophy This is a chronic problem. The current episode started more than 1 year ago. Obstructive symptoms do not include dribbling, incomplete emptying, an intermittent stream, a slower stream, straining or a weak stream. has increased nocturia.  Chest Pain  This is a chronic (has been seeing cards for such, has had -ve myoview test looking for cardiomyopathy, has had a zio monitor as well) problem.    Chief Complaint  Patient presents with   Medication Refill    Flomax. Refill - may need refills on others he is not sure    Blood Clots    Patient rpeorts HX of blood clots, states he would like to discuss testing options for him to see if he is susceptible to having them, states he has lost a lot of relatives recently d/t blood clots.     Relevant past medical, surgical, family and social history reviewed and updated as indicated. Interim medical history since our last visit reviewed. Allergies and medications reviewed and updated.  Review of Systems  Cardiovascular:  Positive for chest pain.  Genitourinary:  Negative for incomplete emptying.    Per HPI unless specifically indicated above     Objective:    BP  125/83   Pulse 81   Temp (!) 97.5 F (36.4 C) (Oral)   Wt 225 lb 12.8 oz (102.4 kg)   SpO2 96%   BMI 28.22 kg/m   Wt Readings from Last 3 Encounters:  10/05/21 224 lb (101.6 kg)  09/28/21 225 lb 12.8 oz (102.4 kg)  09/09/21 229 lb 6 oz (104 kg)    Physical Exam  Results for orders placed or performed in visit on 09/28/21  PSA  Result Value Ref Range   Prostate Specific Ag, Serum 1.4 0.0 - 4.0 ng/mL  Lipid panel  Result Value Ref Range   Cholesterol, Total 147 100 - 199 mg/dL   Triglycerides 94 0 - 149 mg/dL   HDL 58 >25 mg/dL   VLDL Cholesterol Cal 17 5 - 40 mg/dL   LDL Chol Calc (NIH) 72 0 - 99 mg/dL   Chol/HDL Ratio 2.5 0.0 - 5.0 ratio        Current Outpatient Medications:    aspirin 81 MG chewable tablet, Chew 81 mg by mouth daily., Disp: , Rfl:    atorvastatin (LIPITOR) 40 MG tablet, TAKE 1 TABLET BY MOUTH ONCE DAILY, Disp: 90 tablet, Rfl: 2   ENTRESTO 24-26 MG, TAKE 1 TABLET BY MOUTH TWICE DAILY, Disp: 60  tablet, Rfl: 6   famotidine (PEPCID) 20 MG tablet, One an hour before bedtime, Disp: 30 tablet, Rfl: 11   JARDIANCE 10 MG TABS tablet, TAKE 1 TABLET BY MOUTH ONCE EVERY MORNING WITH BREAKFAST, Disp: 30 tablet, Rfl: 3   omeprazole (PRILOSEC) 20 MG capsule, Take 2  x 30-60   min before first meal, Disp: , Rfl:    spironolactone (ALDACTONE) 25 MG tablet, Take 0.5 tablets (12.5 mg total) by mouth daily., Disp: 45 tablet, Rfl: 3   tamsulosin (FLOMAX) 0.4 MG CAPS capsule, Take 1 capsule (0.4 mg total) by mouth daily., Disp: 90 capsule, Rfl: 1    Assessment & Plan:  BPH : will refer to urology is on flomax for such   CHF : continue fu with cards for such     Problem List Items Addressed This Visit       Cardiovascular and Mediastinum   Coronary artery disease due to lipid rich plaque   Relevant Medications   tamsulosin (FLOMAX) 0.4 MG CAPS capsule   Other Relevant Orders   Ambulatory referral to Hematology / Oncology   Lipid panel   PSA (Completed)    Ambulatory referral to Urology   Lipid panel (Completed)     Other   Lower urinary tract symptoms (LUTS)   Relevant Medications   tamsulosin (FLOMAX) 0.4 MG CAPS capsule   Other Relevant Orders   Ambulatory referral to Hematology / Oncology   Lipid panel   PSA (Completed)   Ambulatory referral to Urology   Lipid panel (Completed)   Family history of blood coagulation disorder - Primary   Relevant Medications   tamsulosin (FLOMAX) 0.4 MG CAPS capsule   Other Relevant Orders   Ambulatory referral to Hematology / Oncology   Lipid panel   PSA (Completed)   Ambulatory referral to Urology   Lipid panel (Completed)     Orders Placed This Encounter  Procedures   Lipid panel   PSA   Lipid panel   Ambulatory referral to Hematology / Oncology   Ambulatory referral to Urology     Meds ordered this encounter  Medications   tamsulosin (FLOMAX) 0.4 MG CAPS capsule    Sig: Take 1 capsule (0.4 mg total) by mouth daily.    Dispense:  90 capsule    Refill:  1     Follow up plan: Return in about 3 months (around 12/29/2021).

## 2021-09-29 LAB — LIPID PANEL
Chol/HDL Ratio: 2.5 ratio (ref 0.0–5.0)
Cholesterol, Total: 147 mg/dL (ref 100–199)
HDL: 58 mg/dL (ref >39)
LDL Chol Calc (NIH): 72 mg/dL (ref 0–99)
Triglycerides: 94 mg/dL (ref 0–149)
VLDL Cholesterol Cal: 17 mg/dL (ref 5–40)

## 2021-09-29 LAB — PSA: Prostate Specific Ag, Serum: 1.4 ng/mL (ref 0.0–4.0)

## 2021-10-05 ENCOUNTER — Encounter: Payer: Self-pay | Admitting: Oncology

## 2021-10-05 ENCOUNTER — Inpatient Hospital Stay: Payer: BC Managed Care – PPO

## 2021-10-05 ENCOUNTER — Inpatient Hospital Stay: Payer: BC Managed Care – PPO | Attending: Oncology | Admitting: Oncology

## 2021-10-05 VITALS — BP 107/73 | HR 82 | Resp 18 | Wt 224.0 lb

## 2021-10-05 DIAGNOSIS — Z832 Family history of diseases of the blood and blood-forming organs and certain disorders involving the immune mechanism: Secondary | ICD-10-CM | POA: Diagnosis not present

## 2021-10-05 DIAGNOSIS — Z136 Encounter for screening for cardiovascular disorders: Secondary | ICD-10-CM | POA: Insufficient documentation

## 2021-10-05 DIAGNOSIS — Z800 Family history of malignant neoplasm of digestive organs: Secondary | ICD-10-CM | POA: Insufficient documentation

## 2021-10-05 LAB — ANTITHROMBIN III: AntiThromb III Func: 92 % (ref 75–120)

## 2021-10-07 LAB — PROTEIN S PANEL
Protein S Activity: 94 % (ref 63–140)
Protein S Ag, Free: 79 % (ref 61–136)
Protein S Ag, Total: 100 % (ref 60–150)

## 2021-10-08 DIAGNOSIS — F4323 Adjustment disorder with mixed anxiety and depressed mood: Secondary | ICD-10-CM | POA: Diagnosis not present

## 2021-10-08 LAB — PROTEIN C, TOTAL: Protein C, Total: 107 % (ref 60–150)

## 2021-10-09 DIAGNOSIS — I471 Supraventricular tachycardia, unspecified: Secondary | ICD-10-CM

## 2021-10-09 HISTORY — DX: Supraventricular tachycardia, unspecified: I47.10

## 2021-10-11 LAB — FACTOR 5 LEIDEN

## 2021-10-13 LAB — PROTHROMBIN GENE MUTATION

## 2021-10-21 DIAGNOSIS — R079 Chest pain, unspecified: Secondary | ICD-10-CM | POA: Diagnosis not present

## 2021-10-25 ENCOUNTER — Encounter: Payer: Self-pay | Admitting: Oncology

## 2021-10-25 ENCOUNTER — Inpatient Hospital Stay: Payer: BC Managed Care – PPO | Attending: Oncology | Admitting: Oncology

## 2021-10-25 DIAGNOSIS — Z8249 Family history of ischemic heart disease and other diseases of the circulatory system: Secondary | ICD-10-CM | POA: Diagnosis not present

## 2021-10-26 ENCOUNTER — Ambulatory Visit: Payer: BC Managed Care – PPO | Admitting: Urology

## 2021-10-26 VITALS — BP 124/80 | HR 91 | Ht 75.0 in | Wt 224.0 lb

## 2021-10-26 DIAGNOSIS — N401 Enlarged prostate with lower urinary tract symptoms: Secondary | ICD-10-CM

## 2021-10-26 DIAGNOSIS — Z832 Family history of diseases of the blood and blood-forming organs and certain disorders involving the immune mechanism: Secondary | ICD-10-CM

## 2021-10-26 DIAGNOSIS — I2510 Atherosclerotic heart disease of native coronary artery without angina pectoris: Secondary | ICD-10-CM | POA: Diagnosis not present

## 2021-10-26 DIAGNOSIS — I2583 Coronary atherosclerosis due to lipid rich plaque: Secondary | ICD-10-CM

## 2021-10-26 DIAGNOSIS — R399 Unspecified symptoms and signs involving the genitourinary system: Secondary | ICD-10-CM

## 2021-10-26 LAB — URINALYSIS, COMPLETE
Bilirubin, UA: NEGATIVE
Ketones, UA: NEGATIVE
Leukocytes,UA: NEGATIVE
Nitrite, UA: NEGATIVE
Protein,UA: NEGATIVE
RBC, UA: NEGATIVE
Specific Gravity, UA: 1.015 (ref 1.005–1.030)
Urobilinogen, Ur: 1 mg/dL (ref 0.2–1.0)
pH, UA: 6 (ref 5.0–7.5)

## 2021-10-26 LAB — MICROSCOPIC EXAMINATION
Bacteria, UA: NONE SEEN
Epithelial Cells (non renal): NONE SEEN /hpf (ref 0–10)

## 2021-10-26 MED ORDER — TAMSULOSIN HCL 0.4 MG PO CAPS: 0.4000 mg | ORAL_CAPSULE | Freq: Every day | ORAL | 3 refills | Status: DC

## 2021-10-26 NOTE — Progress Notes (Signed)
10/26/21 9:59 AM   [name] [birth year] [id number]  Referring provider:  Loura Pardon, MD [address] [place],  [place] [address] No chief complaint on file.   HPI: [name] is a 61 y.o.male who presents today for furthers evaluation of LUTs symptoms.   He has a personal history of BPH managed on Flomax since 2015.  He believes he may have been seen by Dr. Sheppard Penton at that time and told his prostate was "swollen".  He was seen by his PCP, Dr Christiana Pellant on 09/28/2021. He was noted to have chronic problems with BPH.   His most recent PSA on 09/28/2021 was 1.4.   He reports today that his urinary symptoms started years go. He reports that as long as he takes Flomax his symptoms improve.  He reports if he does not take his medications, his stream is weaker and he has trouble emptying his bladder.  He also has urgency frequency.  He denies any dysuria or gross hematuria.  No issues with UTIs.  He also occasionally complains of discomfort underneath his scrotum especially if he forgets his Flomax.  IPPS 22 today.   Notably today, he has 3+ glucose in his urine.  He denies being a diabetic and that is not in his chart.  That being said, he is on Jardiance.   IPSS     Row Name 10/26/21 0800         International Prostate Symptom Score   How often have you had the sensation of not emptying your bladder? More than half the time     How often have you had to urinate less than every two hours? About half the time     How often have you found you stopped and started again several times when you urinated? About half the time     How often have you found it difficult to postpone urination? Less than half the time     How often have you had a weak urinary stream? About half the time     How often have you had to strain to start urination? More than half the time     How many times did you typically get up at night to urinate? 3 Times     Total IPSS Score 22       Quality of Life due to  urinary symptoms   If you were to spend the rest of your life with your urinary condition just the way it is now how would you feel about that? Unhappy              Score:  1-7 Mild 8-19 Moderate 20-35 Severe    PMH: Past Medical History:  Diagnosis Date   Chest pain with high risk for cardiac etiology 10/16/2013   Fatigue due to sleep pattern disturbance 10/16/2013   GERD (gastroesophageal reflux disease)    Implantable loop recorder    [employer] Linq   Nonsustained ventricular tachycardia (HCC)    Identified on his Linq   Prostatism 10/16/2013   Prostatism    Sinus tachycardia    Syncope 10/16/2013   Tachycardia     Surgical History: Past Surgical History:  Procedure Laterality Date   COLONOSCOPY WITH PROPOFOL N/A 11/24/2020   Procedure: COLONOSCOPY WITH PROPOFOL;  Surgeon: [name], MD;  Location: [facility] ENDOSCOPY;  Service: Endoscopy;  Laterality: N/A;   LEFT HEART CATHETERIZATION WITH CORONARY ANGIOGRAM N/A 10/17/2013   Procedure: LEFT HEART CATHETERIZATION WITH CORONARY ANGIOGRAM;  Surgeon: Kathleene Hazel, MD;  Location: [facility] CATH LAB;  Service: Cardiovascular;  Laterality: N/A;   LOOP RECORDER IMPLANT  12-09-13   MDT LINQ implanted by Dr Graciela Husbands for syncope   LOOP RECORDER IMPLANT N/A 12/09/2013   Procedure: LOOP RECORDER IMPLANT;  Surgeon: Duke Salvia, MD;  Location: [facility] CATH LAB;  Service: Cardiovascular;  Laterality: N/A;    Home Medications:  Allergies as of 10/26/2021       Reactions   Amoxicillin Rash        Medication List        Accurate as of October 26, 2021  9:59 AM. If you have any questions, ask your nurse or doctor.          aspirin 81 MG chewable tablet Chew 81 mg by mouth daily.   atorvastatin 40 MG tablet Commonly known as: LIPITOR TAKE 1 TABLET BY MOUTH ONCE DAILY   Entresto 24-26 MG Generic drug: sacubitril-valsartan TAKE 1 TABLET BY MOUTH TWICE DAILY   famotidine 20 MG tablet Commonly known as: Pepcid One an hour  before bedtime   Jardiance 10 MG Tabs tablet Generic drug: empagliflozin TAKE 1 TABLET BY MOUTH ONCE EVERY MORNING WITH BREAKFAST   omeprazole 20 MG capsule Commonly known as: PRILOSEC Take 2  x 30-60   min before first meal   spironolactone 25 MG tablet Commonly known as: ALDACTONE Take 0.5 tablets (12.5 mg total) by mouth daily.   tamsulosin 0.4 MG Caps capsule Commonly known as: FLOMAX Take 1 capsule (0.4 mg total) by mouth daily.        Allergies:  Allergies  Allergen Reactions   Amoxicillin Rash    Family History: Family History  Problem Relation Age of Onset   Hypertension Mother    Arrhythmia Mother    Heart disease Mother    Parkinson's disease Mother    Alzheimer's disease Mother    Deep vein thrombosis Father    Colon cancer Father    Sudden death Maternal Grandfather 25   ADD / ADHD Paternal Aunt    Deep vein thrombosis Paternal Aunt    Deep vein thrombosis Paternal Uncle     Social History:  reports that he has never smoked. He has never used smokeless tobacco. He reports that he does not drink alcohol and does not use drugs.   Physical Exam: BP 124/80   Pulse 91   Ht 6\' 3"  (1.905 m)   Wt 224 lb (101.6 kg)   BMI 28.00 kg/m   Constitutional:  Alert and oriented, No acute distress. HEENT: [place] AT, moist mucus membranes.  Trachea midline, no masses. Cardiovascular: No clubbing, cyanosis, or edema. Respiratory: Normal respiratory effort, no increased work of breathing. Skin: No rashes, bruises or suspicious lesions. Neurologic: Grossly intact, no focal deficits, moving all 4 extremities. Psychiatric: Normal mood and affect.  Laboratory Data: Lab Results  Component Value Date   CREATININE 0.81 08/12/2021    Urinalysis Results for orders placed or performed in visit on 10/26/21  Microscopic Examination   Urine  Result Value Ref Range   WBC, UA 0-5 0 - 5 /hpf   RBC, Urine 0-2 0 - 2 /hpf   Epithelial Cells (non renal) None seen 0 - 10 /hpf    Bacteria, UA None seen None seen/Few  Urinalysis, Complete  Result Value Ref Range   Specific Gravity, UA 1.015 1.005 - 1.030   pH, UA 6.0 5.0 - 7.5   Color, UA Yellow Yellow   Appearance Ur  Clear Clear   Leukocytes,UA Negative Negative   Protein,UA Negative Negative/Trace   Glucose, UA 3+ (A) Negative   Ketones, UA Negative Negative   RBC, UA Negative Negative   Bilirubin, UA Negative Negative   Urobilinogen, Ur 1.0 0.2 - 1.0 mg/dL   Nitrite, UA Negative Negative   Microscopic Examination See below:       Assessment & Plan:    BPH with LUTS  - Longstanding flomax; continue this medication for the time being -Discussed medical optimization with consideration of finasteride versus consideration of outlet procedure given ongoing poorly controlled urinary symptoms despite Flomax - Continues to have symptoms, both storage related and obstructive - He is interested in consideration of outlet procedure  -We discussed further evaluation of anatomy of prostate through cystoscopy/TRUS with rectal exam. - He was given information about UroLift and other alternative   2. Glucosuria  -May be contributing to his urinary symptoms, currently on Jardiance but for unknown reasons -Would ask him to discuss this with his PCP, consider diabetes screening and to help further elucidate why he is on this medication  Schedule cystoscopy/TRUS/rectal exam  I,Kailey Littlejohn,acting as a scribe for Vanna Scotland, MD.,have documented all relevant documentation on the behalf of Vanna Scotland, MD,as directed by  Vanna Scotland, MD while in the presence of Vanna Scotland, MD.  I have reviewed the above documentation for accuracy and completeness, and I agree with the above.   Vanna Scotland, MD    [facility] [address] [place], [place] [address] [phone]

## 2021-10-26 NOTE — Patient Instructions (Signed)
Cystoscopy Cystoscopy is a procedure that is used to help diagnose and sometimes treat conditions that affect the lower urinary tract. The lower urinary tract includes the bladder and the urethra. The urethra is the tube that drains urine from the bladder. Cystoscopy is done using a thin, tube-shaped instrument with a light and camera at the end (cystoscope). The cystoscope may be hard or flexible, depending on the goal of the procedure. The cystoscope is inserted through the urethra, into the bladder. Cystoscopy may be recommended if you have: Urinary tract infections that keep coming back. Blood in the urine (hematuria). An inability to control when you urinate (urinary incontinence) or an overactive bladder. Unusual cells found in a urine sample. A blockage in the urethra, such as a urinary stone. Painful urination. An abnormality in the bladder found during an intravenous pyelogram (IVP) or CT scan. What are the risks? Generally, this is a safe procedure. However, problems may occur, including: Infection. Bleeding.  What happens during the procedure?  You will be given one or more of the following: A medicine to numb the area (local anesthetic). The area around the opening of your urethra will be cleaned. The cystoscope will be passed through your urethra into your bladder. Germ-free (sterile) fluid will flow through the cystoscope to fill your bladder. The fluid will stretch your bladder so that your health care provider can clearly examine your bladder walls. Your doctor will look at the urethra and bladder. The cystoscope will be removed The procedure may vary among health care providers  What can I expect after the procedure? After the procedure, it is common to have: Some soreness or pain in your urethra. Urinary symptoms. These include: Mild pain or burning when you urinate. Pain should stop within a few minutes after you urinate. This may last for up to a few days after the  procedure. A small amount of blood in your urine for several days. Feeling like you need to urinate but producing only a small amount of urine. Follow these instructions at home: General instructions Return to your normal activities as told by your health care provider.  Drink plenty of fluids after the procedure. Keep all follow-up visits as told by your health care provider. This is important. Contact a health care provider if you: Have pain that gets worse or does not get better with medicine, especially pain when you urinate lasting longer than 72 hours after the procedure. Have trouble urinating. Get help right away if you: Have blood clots in your urine. Have a fever or chills. Are unable to urinate. Summary Cystoscopy is a procedure that is used to help diagnose and sometimes treat conditions that affect the lower urinary tract. Cystoscopy is done using a thin, tube-shaped instrument with a light and camera at the end. After the procedure, it is common to have some soreness or pain in your urethra. It is normal to have blood in your urine after the procedure.  If you were prescribed an antibiotic medicine, take it as told by your health care provider.  This information is not intended to replace advice given to you by your health care provider. Make sure you discuss any questions you have with your health care provider. Document Revised: 03/20/2018 Document Reviewed: 03/20/2018 Elsevier Patient Education  2020 Elsevier Inc.   Transrectal Ultrasound  A transrectal ultrasound is a procedure that uses sound waves to create images of the prostate gland and nearby tissues. For this procedure, an ultrasound probe is placed   in the rectum. The probe sends sound waves through the wall of the rectum into the prostate gland. The prostate is a walnut-sized gland that is located below the bladder and in front of the rectum. The images show the size and shape of the prostate gland and nearby  structures. You may need this test if you have: Trouble urinating. Trouble getting your partner pregnant (infertility). An abnormal result from a prostate screening exam. Tell a health care provider about: Any allergies you have. All medicines you are taking, including vitamins, herbs, eye drops, creams, and over-the-counter medicines. Any bleeding problems you have. Any surgeries you have had. Any medical conditions you have. Any prostate infections you have had. What are the risks? Generally, this is a safe procedure. However, problems may occur, including: Discomfort during the procedure. Blood in your urine or sperm after the procedure. This may occur if a sample of tissue is taken to look at under a microscope (biopsy) during the procedure. What happens before the procedure? Your health care provider may instruct you to use an enema 1-4 hours before the procedure. Follow instructions from your health care provider about how to do the enema. Ask your health care provider about: Changing or stopping your regular medicines. This is especially important if you are taking diabetes medicines or blood thinners. Taking medicines such as aspirin and ibuprofen. These medicines can thin your blood. Do not take these medicines unless your health care provider tells you to take them. Taking over-the-counter medicines, vitamins, herbs, and supplements. What happens during the procedure? You will be asked to lie down on your left side on an exam table. You will bend your knees toward your chest. Gel will be put on a small probe that is about the width of a finger. The probe will be gently inserted into your rectum. You may have a feeling of fullness but should not feel pain. The probe will send signals to a computer that will create images. These will be displayed on a monitor that looks like a small television screen. The technician will slightly rotate the probe throughout the procedure. While  rotating the probe, he or she will view and capture images of the prostate gland and the surrounding structures from different angles. Your health care provider may take a biopsy sample of prostate tissue during the procedure. The images captured from the ultrasound will help guide the needle that is used to remove a sample of tissue. The sample will be sent to a lab for testing. The probe will be removed. The procedure may vary among health care providers and hospitals. What can I expect after the procedure? It is up to you to get the results of your procedure. Ask your health care provider, or the department that is doing the procedure, when your results will be ready. Keep all follow-up visits. This is important. Summary A transrectal ultrasound is a procedure that uses sound waves to create images of the prostate gland and nearby tissues. The images show the size and shape of the prostate gland and nearby structures. Before the procedure, ask your health care provider about changing or stopping your regular medicines. This is especially important if you are taking diabetes medicines or blood thinners. This information is not intended to replace advice given to you by your health care provider. Make sure you discuss any questions you have with your health care provider. Document Revised: 12/09/2020 Document Reviewed: 09/21/2020 Elsevier Patient Education  2023 Elsevier Inc.  

## 2021-10-27 NOTE — Progress Notes (Signed)
I connected with [name] on 10/27/21 at  3:15 PM EDT by video enabled telemedicine visit and verified that I am speaking with the correct person using two identifiers.   I discussed the limitations, risks, security and privacy concerns of performing an evaluation and management service by telemedicine and the availability of in-person appointments. I also discussed with the patient that there may be a patient responsible charge related to this service. The patient expressed understanding and agreed to proceed.  Other persons participating in the visit and their role in the encounter:  none  Patient's location:  home Provider's location:  work  Stage manager Complaint: Discuss results of blood work  History of present illness: patient is a 61 year old male who has been referred to [place] for family history of possible blood clotting disorder.  Patient states that his father was diagnosed with colon cancer in the late 30s but died of a blood clot that was found on autopsy.  2 of his father's brothers were also diagnosed with blood clot and one of them died a few weeks ago with blood clot found on autopsy.  His first cousin also died of a blood clot at a young age and apparently they found a blood clot at the time of autopsy.  Patient has himself never had any blood clots.  He has not had any prior surgeries.  He is not on any blood thinners.  None of the family members who have died were ever tested for any hypercoagulable disorders.  Patient states that he gets a colonoscopy done every few years following his father's diagnosis of colon cancer.   Results of hypercoagulable work-up from6/27/2023 was as follows: No evidence of prothrombin gene are factor V Leiden mutation.  Protein C protein S and Antithrombin III levels were normal  Interval history patient is on and off episodes of chest pain for which she has followed up with cardiology in the past.  He also has lower urinary tract symptoms for which he follows  up with urology   Review of Systems  Constitutional:  Negative for chills, fever, malaise/fatigue and weight loss.  HENT:  Negative for congestion, ear discharge and nosebleeds.   Eyes:  Negative for blurred vision.  Respiratory:  Negative for cough, hemoptysis, sputum production, shortness of breath and wheezing.   Cardiovascular:  Negative for chest pain, palpitations, orthopnea and claudication.  Gastrointestinal:  Negative for abdominal pain, blood in stool, constipation, diarrhea, heartburn, melena, nausea and vomiting.  Genitourinary:  Negative for dysuria, flank pain, frequency, hematuria and urgency.  Musculoskeletal:  Negative for back pain, joint pain and myalgias.  Skin:  Negative for rash.  Neurological:  Negative for dizziness, tingling, focal weakness, seizures, weakness and headaches.  Endo/Heme/Allergies:  Does not bruise/bleed easily.  Psychiatric/Behavioral:  Negative for depression and suicidal ideas. The patient does not have insomnia.     Allergies  Allergen Reactions   Amoxicillin Rash    Past Medical History:  Diagnosis Date   Chest pain with high risk for cardiac etiology 10/16/2013   Fatigue due to sleep pattern disturbance 10/16/2013   GERD (gastroesophageal reflux disease)    Implantable loop recorder    Medtronic Linq   Nonsustained ventricular tachycardia (HCC)    Identified on his Linq   Prostatism 10/16/2013   Prostatism    Sinus tachycardia    Syncope 10/16/2013   Tachycardia     Past Surgical History:  Procedure Laterality Date   COLONOSCOPY WITH PROPOFOL N/A 11/24/2020   Procedure:  COLONOSCOPY WITH PROPOFOL;  Surgeon: [name], MD;  Location: [facility] ENDOSCOPY;  Service: Endoscopy;  Laterality: N/A;   LEFT HEART CATHETERIZATION WITH CORONARY ANGIOGRAM N/A 10/17/2013   Procedure: LEFT HEART CATHETERIZATION WITH CORONARY ANGIOGRAM;  Surgeon: Kathleene Hazel, MD;  Location: [facility] CATH LAB;  Service: Cardiovascular;  Laterality: N/A;   LOOP  RECORDER IMPLANT  12-09-13   MDT LINQ implanted by Dr Graciela Husbands for syncope   LOOP RECORDER IMPLANT N/A 12/09/2013   Procedure: LOOP RECORDER IMPLANT;  Surgeon: Duke Salvia, MD;  Location: [facility] CATH LAB;  Service: Cardiovascular;  Laterality: N/A;    Social History   Socioeconomic History   Marital status: Widowed    Spouse name: Not on file   Number of children: Not on file   Years of education: Not on file   Highest education level: Not on file  Occupational History   Not on file  Tobacco Use   Smoking status: Never   Smokeless tobacco: Never  Vaping Use   Vaping Use: Never used  Substance and Sexual Activity   Alcohol use: No   Drug use: No   Sexual activity: Yes  Other Topics Concern   Not on file  Social History Narrative   Not on file   Social Determinants of Health   Financial Resource Strain: Not on file  Food Insecurity: Not on file  Transportation Needs: Not on file  Physical Activity: Not on file  Stress: Not on file  Social Connections: Not on file  Intimate Partner Violence: Not on file    Family History  Problem Relation Age of Onset   Hypertension Mother    Arrhythmia Mother    Heart disease Mother    Parkinson's disease Mother    Alzheimer's disease Mother    Deep vein thrombosis Father    Colon cancer Father    Sudden death Maternal Grandfather 55   ADD / ADHD Paternal Aunt    Deep vein thrombosis Paternal Aunt    Deep vein thrombosis Paternal Uncle      Current Outpatient Medications:    aspirin 81 MG chewable tablet, Chew 81 mg by mouth daily., Disp: , Rfl:    atorvastatin (LIPITOR) 40 MG tablet, TAKE 1 TABLET BY MOUTH ONCE DAILY, Disp: 90 tablet, Rfl: 2   ENTRESTO 24-26 MG, TAKE 1 TABLET BY MOUTH TWICE DAILY, Disp: 60 tablet, Rfl: 6   famotidine (PEPCID) 20 MG tablet, One an hour before bedtime, Disp: 30 tablet, Rfl: 11   JARDIANCE 10 MG TABS tablet, TAKE 1 TABLET BY MOUTH ONCE EVERY MORNING WITH BREAKFAST, Disp: 30 tablet, Rfl: 3    omeprazole (PRILOSEC) 20 MG capsule, Take 2  x 30-60   min before first meal, Disp: , Rfl:    spironolactone (ALDACTONE) 25 MG tablet, Take 0.5 tablets (12.5 mg total) by mouth daily., Disp: 45 tablet, Rfl: 3   tamsulosin (FLOMAX) 0.4 MG CAPS capsule, Take 1 capsule (0.4 mg total) by mouth daily., Disp: 90 capsule, Rfl: 3  No results found.  No images are attached to the encounter.      Latest Ref Rng & Units 08/12/2021   10:26 AM  CMP  Glucose 70 - 99 mg/dL 300   BUN 8 - 23 mg/dL 22   Creatinine 7.62 - 1.24 mg/dL 2.63   Sodium 335 - 456 mmol/L 137   Potassium 3.5 - 5.1 mmol/L 4.0   Chloride 98 - 111 mmol/L 107   CO2 22 - 32 mmol/L 24  Calcium 8.9 - 10.3 mg/dL 8.6       Latest Ref Rng & Units 08/12/2021   10:26 AM  CBC  WBC 4.0 - 10.5 K/uL 6.5   Hemoglobin 13.0 - 17.0 g/dL 09.8   Hematocrit 11.9 - 52.0 % 47.5   Platelets 150 - 400 K/uL 255      Observation/objective: Appears in no acute distress over video visit today.  Breathing is nonlabored  Assessment and plan: Patient is a 61 year old male referred for family history of blood clots  As per patient multiple family members have been diagnosed with blood clots in died from it.  He was concerned and therefore wanted to get tested.  Patient himself has never had any episodes of thrombosis.  Factor V Leiden, prothrombin gene mutation, protein C protein S and Antithrombin III levels are all normal.  No further testing indicated at this time.  He does not need to be on any prophylactic anticoagulation.  As such patient does not require any hematology follow-up at this time he can be referred to [place] in the future if questions or concerns arise  Follow-up instructions: No follow-up needed  I discussed the assessment and treatment plan with the patient. The patient was provided an opportunity to ask questions and all were answered. The patient agreed with the plan and demonstrated an understanding of the instructions.   The patient  was advised to call back or seek an in-person evaluation if the symptoms worsen or if the condition fails to improve as anticipated.  I provided 12 minutes of face-to-face video visit time during this encounter. Time spent in reviewing labs that were ordered, explaining results and management to the patient  Visit Diagnosis: 1. Family history of blood clots     Dr. Owens Shark, MD, MPH [facility] at [facility] Tel- [phone] 10/27/2021 5:23 PM

## 2021-11-02 ENCOUNTER — Other Ambulatory Visit: Payer: Self-pay | Admitting: Internal Medicine

## 2021-11-02 ENCOUNTER — Encounter: Payer: Self-pay | Admitting: Medical

## 2021-11-02 ENCOUNTER — Ambulatory Visit (INDEPENDENT_AMBULATORY_CARE_PROVIDER_SITE_OTHER): Payer: BC Managed Care – PPO | Admitting: Medical

## 2021-11-02 VITALS — BP 112/76 | HR 76 | Ht 75.0 in | Wt 227.4 lb

## 2021-11-02 DIAGNOSIS — I5022 Chronic systolic (congestive) heart failure: Secondary | ICD-10-CM

## 2021-11-02 DIAGNOSIS — I428 Other cardiomyopathies: Secondary | ICD-10-CM

## 2021-11-02 DIAGNOSIS — E782 Mixed hyperlipidemia: Secondary | ICD-10-CM

## 2021-11-02 DIAGNOSIS — R079 Chest pain, unspecified: Secondary | ICD-10-CM | POA: Diagnosis not present

## 2021-11-02 DIAGNOSIS — I2510 Atherosclerotic heart disease of native coronary artery without angina pectoris: Secondary | ICD-10-CM

## 2021-11-02 DIAGNOSIS — I471 Supraventricular tachycardia: Secondary | ICD-10-CM

## 2021-11-02 DIAGNOSIS — I2583 Coronary atherosclerosis due to lipid rich plaque: Secondary | ICD-10-CM | POA: Diagnosis not present

## 2021-11-02 DIAGNOSIS — R002 Palpitations: Secondary | ICD-10-CM | POA: Diagnosis not present

## 2021-11-02 MED ORDER — METOPROLOL TARTRATE 25 MG PO TABS: 12.5000 mg | ORAL_TABLET | Freq: Two times a day (BID) | ORAL | 5 refills | Status: DC

## 2021-11-02 NOTE — Patient Instructions (Signed)
Medication Instructions:   Your physician has recommended you make the following change in your medication:   START Metoprolol Tartrate 12.5 mg TWICE daily   *If you need a refill on your cardiac medications before your next appointment, please call your pharmacy*   Lab Work:  None ordered  Testing/Procedures:  None ordered   Follow-Up: At [facility], you and your health needs are our priority.  As part of our continuing mission to provide you with exceptional heart care, we have created designated Provider Care Teams.  These Care Teams include your primary Cardiologist (physician) and Advanced Practice Providers (APPs -  Physician Assistants and Nurse Practitioners) who all work together to provide you with the care you need, when you need it.  We recommend signing up for the patient portal called "MyChart".  Sign up information is provided on this After Visit Summary.  MyChart is used to connect with patients for Virtual Visits (Telemedicine).  Patients are able to view lab/test results, encounter notes, upcoming appointments, etc.  Non-urgent messages can be sent to your provider as well.   To learn more about what you can do with MyChart, go to [email or web address].    Your next appointment:    1) Follow up with Dr. Graciela Husbands for SVT   2) 4 month(s) with primary cardiologist or APP  The format for your next appointment:   In Person  Provider:   You may see Dr. Cristal Deer End or one of the following Advanced Practice Providers on your designated Care Team:   Nicolasa Ducking, NP Eula Listen, PA-C Cadence Fransico Michael, PA-C{   Important Information About Sugar

## 2021-11-02 NOTE — Progress Notes (Signed)
Cardiology Office Note:    Date:  11/02/2021   ID:  [name], DOB Dec 22, 1960, MRN [id number]  PCP:  Gabriel Cirri, NP  [facility] Cardiologist:  None  [facility] Electrophysiologist:  None   Referring MD: Loura Pardon, MD   Chief Complaint: Follow-up heart monitor  History of Present Illness:    [name] is a 61 y.o. male with a hx of nonobstructive CAD, cardiomyopathy, HLD, h/o ILR implantation for syncope who presents for follow-up.    Heart cath in 2015 showed no obstructive CAD.   Echo 09/29/20 showed LVEF 45-50%, G1DD, WMA, normal RV function, aortic dilation 52mm. Coronary CTA showed calcium score of 108, mild proximal LAD disease and minimal distal rca disease. Myoview Lexiscan showed no significant ischemia, low risk and was overall normal study with EF50%.    Seen by Dr. Graciela Husbands 02/04/21 and no changes were made. Plan was to repeat an echo to assess EF in 3-6 months to make a decision about addition of BB.   Last seen 09/09/21 and reported sharp chest pain and elevated heart rates. A 2 week heart monitor was ordered.   Today, the heart monitor was reviewed. He continues to have stabbing pains when his heart rate goes up. It is debilitating and has waken him up before. No SOB. He has episodes about once a week, which is more than before. HE denies lightheadedness, dizziness, LLE, orthopnea, pnd.   Past Medical History:  Diagnosis Date   Chest pain with high risk for cardiac etiology 10/16/2013   Fatigue due to sleep pattern disturbance 10/16/2013   GERD (gastroesophageal reflux disease)    Implantable loop recorder    Medtronic Linq   Nonsustained ventricular tachycardia (HCC)    Identified on his Linq   Prostatism 10/16/2013   Prostatism    Sinus tachycardia    Syncope 10/16/2013   Tachycardia     Past Surgical History:  Procedure Laterality Date   COLONOSCOPY WITH PROPOFOL N/A 11/24/2020   Procedure: COLONOSCOPY WITH PROPOFOL;  Surgeon: [name],  MD;  Location: [facility] ENDOSCOPY;  Service: Endoscopy;  Laterality: N/A;   LEFT HEART CATHETERIZATION WITH CORONARY ANGIOGRAM N/A 10/17/2013   Procedure: LEFT HEART CATHETERIZATION WITH CORONARY ANGIOGRAM;  Surgeon: Kathleene Hazel, MD;  Location: [facility] CATH LAB;  Service: Cardiovascular;  Laterality: N/A;   LOOP RECORDER IMPLANT  12-09-13   MDT LINQ implanted by Dr Graciela Husbands for syncope   LOOP RECORDER IMPLANT N/A 12/09/2013   Procedure: LOOP RECORDER IMPLANT;  Surgeon: Duke Salvia, MD;  Location: [facility] CATH LAB;  Service: Cardiovascular;  Laterality: N/A;    Current Medications: Current Meds  Medication Sig   aspirin 81 MG chewable tablet Chew 81 mg by mouth daily.   atorvastatin (LIPITOR) 40 MG tablet TAKE 1 TABLET BY MOUTH ONCE DAILY   ENTRESTO 24-26 MG TAKE 1 TABLET BY MOUTH TWICE DAILY   famotidine (PEPCID) 20 MG tablet One an hour before bedtime   JARDIANCE 10 MG TABS tablet TAKE 1 TABLET BY MOUTH ONCE EVERY MORNING WITH BREAKFAST   metoprolol tartrate (LOPRESSOR) 25 MG tablet Take 0.5 tablets (12.5 mg total) by mouth 2 (two) times daily.   omeprazole (PRILOSEC) 20 MG capsule Take 2  x 30-60   min before first meal   spironolactone (ALDACTONE) 25 MG tablet Take 0.5 tablets (12.5 mg total) by mouth daily.   tamsulosin (FLOMAX) 0.4 MG CAPS capsule Take 1 capsule (0.4 mg total) by mouth daily.  Allergies:   Amoxicillin   Social History   Socioeconomic History   Marital status: Widowed    Spouse name: Not on file   Number of children: Not on file   Years of education: Not on file   Highest education level: Not on file  Occupational History   Not on file  Tobacco Use   Smoking status: Never   Smokeless tobacco: Never  Vaping Use   Vaping Use: Never used  Substance and Sexual Activity   Alcohol use: No   Drug use: No   Sexual activity: Yes  Other Topics Concern   Not on file  Social History Narrative   Not on file   Social Determinants of Health   Financial Resource  Strain: Not on file  Food Insecurity: Not on file  Transportation Needs: Not on file  Physical Activity: Not on file  Stress: Not on file  Social Connections: Not on file     Family History: The patient's family history includes ADD / ADHD in his paternal aunt; Alzheimer's disease in his mother; Arrhythmia in his mother; Colon cancer in his father; Deep vein thrombosis in his father, paternal aunt, and paternal uncle; Heart disease in his mother; Hypertension in his mother; Parkinson's disease in his mother; Sudden death (age of onset: 42) in his maternal grandfather.  ROS:   Please see the history of present illness.     All other systems reviewed and are negative.  EKGs/Labs/Other Studies Reviewed:    The following studies were reviewed today:  Heart monitor 10/2021 Patch Wear Time:  14 days and 0 hours (2023-06-03T14:48:54-0400 to 2023-06-17T14:48:58-0400)   Patient had a min HR of 50 bpm, max HR of 226 bpm, and avg HR of 76 bpm. Predominant underlying rhythm was Sinus Rhythm.    2 Ventricular Tachycardia runs occurred, the run with the fastest interval lasting 6 beats with a max rate of 197 bpm (avg 176 bpm);  the run with the fastest interval was also the longest.    148 Supraventricular Tachycardia runs occurred, the run with the fastest interval lasting 8.0 secs with a max rate of 226 bpm, the longest lasting 55.8 secs with an avg rate of 153 bpm. Isolated  SVEs were rare (<1.0%), SVE Couplets were rare (<1.0%), and SVE Triplets were rare (<1.0%). Isolated VEs were occasional (1.0%, 15761), VE Couplets were rare (<1.0%, 349), and VE Triplets were rare (<1.0%, 6). Ventricular Bigeminy and Trigeminy were  present.    Triggered events assoc with sinus Continue current meds   Echo 07/2021  1. Left ventricular ejection fraction, by estimation, is 45 to 50%. The  left ventricle has mildly decreased function. The left ventricle has no  regional wall motion abnormalities. Left  ventricular diastolic parameters  are consistent with Grade II  diastolic dysfunction (pseudonormalization). The average left ventricular  global longitudinal strain is -16.5 %.   2. Right ventricular systolic function is normal. The right ventricular  size is normal. Tricuspid regurgitation signal is inadequate for assessing  PA pressure.   3. The mitral valve is normal in structure. Mild mitral valve  regurgitation. No evidence of mitral stenosis.   4. The aortic valve is tricuspid. Aortic valve regurgitation is not  visualized. No aortic stenosis is present.   5. There is borderline dilatation of the aortic root and of the ascending  aorta, measuring 37 mm.   6. The inferior vena cava is normal in size with greater than 50%  respiratory variability, suggesting right  atrial pressure of 3 mmHg.    Echo 09/2020  1. Left ventricular ejection fraction, by estimation, is 45 to 50%. The  left ventricle has mildly decreased function. The left ventricle  demonstrates regional wall motion abnormalities (see scoring  diagram/findings for description). Left ventricular  diastolic parameters are consistent with Grade I diastolic dysfunction  (impaired relaxation). There is severe hypokinesis of the left  ventricular, basal-mid inferior segment. The average left ventricular  global longitudinal strain is -15.5 %. The global  longitudinal strain is abnormal.   2. Right ventricular systolic function is normal. The right ventricular  size is normal.   3. The mitral valve is normal in structure. Mild mitral valve  regurgitation. No evidence of mitral stenosis.   4. The aortic valve is tricuspid. Aortic valve regurgitation is not  visualized. No aortic stenosis is present.   5. Aortic dilatation noted. There is borderline dilatation of the aortic  root, measuring 37 mm. There is mild dilatation of the ascending aorta,  measuring 39 mm. There is mild dilatation of the aortic arch, measuring 33  mm.     Myoview Lexiscan 09/2020 Narrative & Impression  Blood pressure demonstrated a normal response to exercise. There was no ST segment deviation noted during stress. No T wave inversion was noted during stress. The study is normal. This is a low risk study. The left ventricular ejection fraction is normal. calculated value is 50%. EF appears higher visually. correlation with echo advised. There is no evidence for ischemia.        Cardiac CTA 11/2020 IMPRESSION: 1. Coronary calcium score of 108. This was 66th percentile for age and sex matched control.   2. Normal coronary origin with right dominance.   3. Mild proximal LAD disease (25%), minimal distal RCA disease (<25%).   4. CAD-RADS 2. Mild non-obstructive CAD (25-49%). Consider non-atherosclerotic causes of chest pain. Consider preventive therapy and risk factor modification.   Electronically Signed: By: Debbe Odea M.D. On: 11/16/2020 12:53     EKG:  EKG is  ordered today.  The ekg ordered today demonstrates NSR 76bpm, LAD, nonspecific T wave changes  Recent Labs: 11/20/2020: ALT 12; TSH 0.943 08/12/2021: BUN 22; Creatinine, Ser 0.81; Hemoglobin 15.8; Platelets 255; Potassium 4.0; Sodium 137  Recent Lipid Panel    Component Value Date/Time   CHOL 147 09/28/2021 1435   TRIG 94 09/28/2021 1435   HDL 58 09/28/2021 1435   CHOLHDL 2.5 09/28/2021 1435   LDLCALC 72 09/28/2021 1435     Physical Exam:    VS:  BP 112/76 (BP Location: Left Arm, Patient Position: Sitting, Cuff Size: Normal)   Pulse 76   Ht 6\' 3"  (1.905 m)   Wt 227 lb 6.4 oz (103.1 kg)   SpO2 95%   BMI 28.42 kg/m     Wt Readings from Last 3 Encounters:  11/02/21 227 lb 6.4 oz (103.1 kg)  10/26/21 224 lb (101.6 kg)  10/05/21 224 lb (101.6 kg)     GEN:  Well nourished, well developed in no acute distress HEENT: Normal NECK: No JVD; No carotid bruits LYMPHATICS: No lymphadenopathy CARDIAC: RRR, no murmurs, rubs, gallops RESPIRATORY:   Clear to auscultation without rales, wheezing or rhonchi  ABDOMEN: Soft, non-tender, non-distended MUSCULOSKELETAL:  No edema; No deformity  SKIN: Warm and dry NEUROLOGIC:  Alert and oriented x 3 PSYCHIATRIC:  Normal affect   ASSESSMENT:    1. Paroxysmal SVT (supraventricular tachycardia) (HCC)   2. Coronary artery disease due to lipid  rich plaque   3. Palpitations   4. Chest pain of uncertain etiology   5. Non-ischemic cardiomyopathy ([place])   6. Chronic systolic heart failure ([place])   7. Hyperlipidemia, mixed    PLAN:    In order of problems listed above:  pSVT Heart monitor showed 148 episodes of SVT longest lasting 55.8 seconds with a max rate of 226bmp. He reports weekly episodes of elevated heart rates, palpitations with associated sharp chest pain and SOB. I will start him on metoprolol 12.5mg BID and refer back to EP.   Chest pain Nonobstructive CAD Chest pain suspected due to pSVT. He had prior Cardiac CTA showing nonobstructive CAD and prior negative Myoview Lexiscan. No further ischemic work-up indicated at this time. Continue Aspirin, statin and BB therapy.   NICM HFmrEF LVEF 45-50% Patient is euvolemic on exam today. Continue Ferne Coe, Spironolactone. I will add on metoprolol as above.   HLD LDL 72, continue Lipitor 40mg  daily.   Disposition: Follow up in 4 month(s) with MD/APP    Signed, Aadvik Roker Ninfa Meeker, PA-C  11/02/2021 8:43 AM     [facility]

## 2021-11-04 ENCOUNTER — Telehealth: Payer: Self-pay | Admitting: Internal Medicine

## 2021-11-04 ENCOUNTER — Encounter: Payer: Self-pay | Admitting: Internal Medicine

## 2021-11-04 ENCOUNTER — Ambulatory Visit: Payer: BC Managed Care – PPO | Admitting: Internal Medicine

## 2021-11-04 VITALS — BP 110/70 | HR 61 | Ht 75.0 in | Wt 228.4 lb

## 2021-11-04 DIAGNOSIS — I471 Supraventricular tachycardia, unspecified: Secondary | ICD-10-CM

## 2021-11-04 DIAGNOSIS — I428 Other cardiomyopathies: Secondary | ICD-10-CM | POA: Diagnosis not present

## 2021-11-04 DIAGNOSIS — I5022 Chronic systolic (congestive) heart failure: Secondary | ICD-10-CM | POA: Diagnosis not present

## 2021-11-04 MED ORDER — METOPROLOL TARTRATE 25 MG PO TABS
25.0000 mg | ORAL_TABLET | Freq: Two times a day (BID) | ORAL | 6 refills | Status: DC
Start: 2021-11-04 — End: 2022-08-04

## 2021-11-04 NOTE — Patient Instructions (Addendum)
Medication Instructions:  - Your physician has recommended you make the following change in your medication:   1) INCREASE Metoprolol tartrate 25 mg: - take 1 whole tablet (25 mg) by mouth TWICE daily    *If you need a refill on your cardiac medications before your next appointment, please call your pharmacy*   Lab Work: - none ordered  If you have labs (blood work) drawn today and your tests are completely normal, you will receive your results only by: MyChart Message (if you have MyChart) OR A paper copy in the mail If you have any lab test that is abnormal or we need to change your treatment, we will call you to review the results.   Testing/Procedures: - none ordered   Follow-Up: At [facility], you and your health needs are our priority.  As part of our continuing mission to provide you with exceptional heart care, we have created designated Provider Care Teams.  These Care Teams include your primary Cardiologist (physician) and Advanced Practice Providers (APPs -  Physician Assistants and Nurse Practitioners) who all work together to provide you with the care you need, when you need it.  We recommend signing up for the patient portal called "MyChart".  Sign up information is provided on this After Visit Summary.  MyChart is used to connect with patients for Virtual Visits (Telemedicine).  Patients are able to view lab/test results, encounter notes, upcoming appointments, etc.  Non-urgent messages can be sent to your provider as well.   To learn more about what you can do with MyChart, go to [email or web address].    Your next appointment:   8-10 week(s)  The format for your next appointment:   Virtual Visit  Provider:   Sherryl Manges, MD    Other Instructions N/a  Important Information About Sugar

## 2021-11-04 NOTE — Telephone Encounter (Signed)
  Patient Consent for Virtual Visit        [name] has provided verbal consent on 11/04/2021 for a virtual visit (video or telephone).   CONSENT FOR VIRTUAL VISIT FOR:  [name]  By participating in this virtual visit I agree to the following:  I hereby voluntarily request, consent and authorize [facility] and its employed or contracted physicians, physician assistants, nurse practitioners or other licensed health care professionals (the Practitioner), to provide me with telemedicine health care services (the "Services") as deemed necessary by the treating Practitioner. I acknowledge and consent to receive the Services by the Practitioner via telemedicine. I understand that the telemedicine visit will involve communicating with the Practitioner through live audiovisual communication technology and the disclosure of certain medical information by electronic transmission. I acknowledge that I have been given the opportunity to request an in-person assessment or other available alternative prior to the telemedicine visit and am voluntarily participating in the telemedicine visit.  I understand that I have the right to withhold or withdraw my consent to the use of telemedicine in the course of my care at any time, without affecting my right to future care or treatment, and that the Practitioner or I may terminate the telemedicine visit at any time. I understand that I have the right to inspect all information obtained and/or recorded in the course of the telemedicine visit and may receive copies of available information for a reasonable fee.  I understand that some of the potential risks of receiving the Services via telemedicine include:  Delay or interruption in medical evaluation due to technological equipment failure or disruption; Information transmitted may not be sufficient (e.g. poor resolution of images) to allow for appropriate medical decision making by the Practitioner; and/or  In  rare instances, security protocols could fail, causing a breach of personal health information.  Furthermore, I acknowledge that it is my responsibility to provide information about my medical history, conditions and care that is complete and accurate to the best of my ability. I acknowledge that Practitioner's advice, recommendations, and/or decision may be based on factors not within their control, such as incomplete or inaccurate data provided by me or distortions of diagnostic images or specimens that may result from electronic transmissions. I understand that the practice of medicine is not an exact science and that Practitioner makes no warranties or guarantees regarding treatment outcomes. I acknowledge that a copy of this consent can be made available to me via my patient portal [facility] MyChart), or I can request a printed copy by calling the office of [facility].    I understand that my insurance will be billed for this visit.   I have read or had this consent read to me. I understand the contents of this consent, which adequately explains the benefits and risks of the Services being provided via telemedicine.  I have been provided ample opportunity to ask questions regarding this consent and the Services and have had my questions answered to my satisfaction. I give my informed consent for the services to be provided through the use of telemedicine in my medical care

## 2021-11-04 NOTE — Progress Notes (Signed)
ELECTROPHYSIOLOGY OFFICE NOTE  Patient ID: [name], MRN: [id number], DOB/AGE: [birth year] 61 y.o. Admit date: (Not on file) Date of Consult: 11/04/2021  Primary Physician: Gabriel Cirri, NP Primary Cardiologist:          HPI [name] is a 61 y.o. male is seen in follow-up for nonischemic cardiomyopathy and a prior history of syncope   initially seen for episode of chest pain associated with a "heart rate over 280". 7/15 catheterization demonstrated no obstructive disease; left ventricular function was normal. He had a loop recorder implanted--7/18 he had a "5-second pause" at 2048 hrs.  Efforts to contact him were unsuccessful.  11/18 his device reached RRT abandoned  10/21 he developed COVID-pneumonia.  Hospitalized and treated with monoclonal antibodies.  Improved but has continued to struggle with dyspnea on exertion. EVal included CXR >> atelectasis   Cardiac evaluation was as below, mild cardiomyopathy with a negative Myoview.  Continues however to have progressively worsening shortness of breath with exertion.  Also recurrent atypical chest pains.  Reviewed imaging studies, no comments made on calcification.  Being seen and evaluated by pulmonary; concern of vocal cord dysfunction.  Chest x-ray ordered.  Cardiopulmonary stress test recommended is a consideration.  Seen 4/23 with complaints of worsening dyspnea.  Started on spironolactone.  Dyspnea is better.  Also has a history of right greater than left edema.  This too is improved.  Seen 6/23 with chest pain which he describes as severe and almost paralyzing. Event recorder>>  Supraventricular Tachycardia runs occurred, the run with the fastest interval lasting 8.0 secs with a max rate of 226 bpm, the longest lasting 55.8 secs with an avg rate of 153 bpm.  But triggered events were sinus  However he had another episode of chest pain in the last couple of days and looked at his watch and the heart rate was 190.   Typically lasts a minute or so and then gradually abates    DATE TEST EF   7/15 Cath  Normal CAs  7/16 ECHO  NORMAL   7/16 ETT NO atrial tachycardia   6/22 Echo  45%    6/22 Myoview  50% No perfusion defects  8/22 CTA  CaScore 108 NonObstructive LAD/RCA  4/23 Echo  45-50%    Date Cr K Hgb  10/21 0.8 3.2 15.9  5/22  0.6 3.9 16.4  8/22  4.2   4/23 0.9 4.0       Past Medical History:  Diagnosis Date   Chest pain with high risk for cardiac etiology 10/16/2013   Fatigue due to sleep pattern disturbance 10/16/2013   GERD (gastroesophageal reflux disease)    Implantable loop recorder    [employer] Linq   Nonsustained ventricular tachycardia (HCC)    Identified on his Linq   Prostatism 10/16/2013   Prostatism    Sinus tachycardia    Syncope 10/16/2013   Tachycardia       Surgical History:  Past Surgical History:  Procedure Laterality Date   COLONOSCOPY WITH PROPOFOL N/A 11/24/2020   Procedure: COLONOSCOPY WITH PROPOFOL;  Surgeon: [name], MD;  Location[facility] ENDOSCOPY;  Service: Endoscopy;  Laterality: N/A;   LEFT HEART CATHETERIZATION WITH CORONARY ANGIOGRAM N/A 10/17/2013   Procedure: LEFT HEART CATHETERIZATION WITH CORONARY ANGIOGRAM;  Surgeon: Kathleene Hazel, MD;  Location: [facility] CATH LAB;  Service: Cardiovascular;  Laterality: N/A;   LOOP RECORDER IMPLANT  12-09-13   MDT LINQ implanted by Dr Graciela Husbands for syncope  LOOP RECORDER IMPLANT N/A 12/09/2013   Procedure: LOOP RECORDER IMPLANT;  Surgeon: Duke Salvia, MD;  Location[facility] CATH LAB;  Service: Cardiovascular;  Laterality: N/A;     Home Meds: Current Meds  Medication Sig   aspirin 81 MG chewable tablet Chew 81 mg by mouth daily.   atorvastatin (LIPITOR) 40 MG tablet TAKE 1 TABLET BY MOUTH ONCE DAILY   ENTRESTO 24-26 MG TAKE 1 TABLET BY MOUTH TWICE DAILY   famotidine (PEPCID) 20 MG tablet One an hour before bedtime   JARDIANCE 10 MG TABS tablet TAKE 1 TABLET BY MOUTH ONCE EVERY MORNING WITH BREAKFAST    metoprolol tartrate (LOPRESSOR) 25 MG tablet Take 0.5 tablets (12.5 mg total) by mouth 2 (two) times daily.   omeprazole (PRILOSEC) 20 MG capsule Take 2  x 30-60   min before first meal   spironolactone (ALDACTONE) 25 MG tablet Take 0.5 tablets (12.5 mg total) by mouth daily.   tamsulosin (FLOMAX) 0.4 MG CAPS capsule Take 1 capsule (0.4 mg total) by mouth daily.    Allergies:  Allergies  Allergen Reactions   Amoxicillin Rash        ROS:  Please see the history of present illness.     All other systems reviewed and negative.   Physical Examination  BP 110/70   Pulse 61   Ht 6\' 3"  (1.905 m)   Wt 228 lb 6.4 oz (103.6 kg)   SpO2 95%   BMI 28.55 kg/m  Well developed and well nourished in no acute distress HENT normal Neck supple with JVP-flat Clear Device pocket well healed; without hematoma or erythema.  There is no tethering  Regular rate and rhythm, no  murmur Abd-soft with active BS No Clubbing cyanosis  edema Skin-warm and dry A & Oriented  Grossly normal sensory and motor function  ECG sinus at 61 Interval 14/11/39 Left axis deviation   Assessment and Plan:  Chest pain atypical  Lightheadedness  Dyspnea on exertion  COVID  Pneumonia in the past  HFmrEF 45-50   Hyperlipidemia    This is significantly improved with the spironolactone and the empagliflozin will continue.  Event recorder demonstrated multiple episodes of SVT which are an atrial tachycardia, also some nonsustained VT.  Symptoms are associated with tachycardia and we could presume with some trepidation that it is SVT that is triggering it.  For right now we will proceed on that hypothesis, he was started on metoprolol which will increase from 12.5--25 twice daily.  And see how he does; we have discussed alternative therapies including calcium blockers and antiarrhythmic drugs.  With his mild cardiomyopathy, probably would favor dronaderone and then a low threshold for catheter ablation.  There  is some discrepancy however between the frequency of his tachy events about 10 times a day and the infrequency of his symptoms about 2 times per week.         16/11/39

## 2021-11-23 ENCOUNTER — Encounter: Payer: Self-pay | Admitting: Urology

## 2021-11-23 ENCOUNTER — Ambulatory Visit (INDEPENDENT_AMBULATORY_CARE_PROVIDER_SITE_OTHER): Payer: BC Managed Care – PPO | Admitting: Urology

## 2021-11-23 VITALS — BP 119/79 | HR 65 | Ht 75.0 in | Wt 222.0 lb

## 2021-11-23 DIAGNOSIS — R399 Unspecified symptoms and signs involving the genitourinary system: Secondary | ICD-10-CM | POA: Diagnosis not present

## 2021-11-23 DIAGNOSIS — N401 Enlarged prostate with lower urinary tract symptoms: Secondary | ICD-10-CM

## 2021-11-23 DIAGNOSIS — R82998 Other abnormal findings in urine: Secondary | ICD-10-CM | POA: Diagnosis not present

## 2021-11-23 LAB — URINALYSIS, COMPLETE
Bilirubin, UA: NEGATIVE
Ketones, UA: NEGATIVE
Leukocytes,UA: NEGATIVE
Nitrite, UA: NEGATIVE
Protein,UA: NEGATIVE
RBC, UA: NEGATIVE
Specific Gravity, UA: 1.02 (ref 1.005–1.030)
Urobilinogen, Ur: 0.2 mg/dL (ref 0.2–1.0)
pH, UA: 5 (ref 5.0–7.5)

## 2021-11-23 LAB — MICROSCOPIC EXAMINATION: Bacteria, UA: NONE SEEN

## 2021-11-23 NOTE — Patient Instructions (Signed)
Transurethral Resection of the Prostate ?Transurethral resection of the prostate (TURP) is the removal, or resection, of part of the prostate tissue. This procedure is done to treat an enlarged prostate gland (benign prostatic hyperplasia). ?The goal of TURP is to remove enough prostate tissue to allow for a normal flow of urine. The procedure will allow you to empty your bladder more completely when you urinate so that you can urinate less often. ?In a transurethral resection, a thin telescope with a light, a camera, and an electric cutting edge (resectoscope) is passed through the urethra and into the prostate. The opening of the urethra is at the end of the penis. ?Tell a health care provider about: ?Any allergies you have. ?All medicines you are taking, including vitamins, herbs, eye drops, creams, and over-the-counter medicines. ?Any problems you or family members have had with anesthetic medicines. ?Any bleeding problems you have. ?Any surgeries you have had. ?Any medical conditions you have. ?Any prostate infections you have had. ?What are the risks? ?Generally, this is a safe procedure. However, problems may occur, including: ?Infection. ?Bleeding. ?Allergic reactions to medicines. ?Blood in the urine (hematuria). ?Damage to nearby structures or organs. ?Other problems may occur, but they are rare. They include: ?Dry ejaculation, or having no semen come out during orgasm. ?Erectile dysfunction, or being unable to have or keep an erection. ?Scarring that leads to narrowing of the urethra. This narrowing may block the flow of urine. ?Inability to control when you urinate (incontinence). ?Deep vein thrombosis. This is a blood clot that can develop in your leg. ?TURP syndrome. This can happen when you lose too much sodium during or after the procedure. Some signs and symptoms of this condition include: ?Weakness. ?Headaches. ?Nausea or vomiting. ?Muscle cramping. ?What happens before the procedure? ?When to stop  eating and drinking ?Follow instructions from your health care provider about what you may eat and drink before your procedure. These may include: ?8 hours before your procedure ?Stop eating most foods. Do not eat meat, fried foods, or fatty foods. ?Eat only light foods, such as toast or crackers. ?All liquids are okay except energy drinks and alcohol. ?6 hours before your procedure ?Stop eating. ?Drink only clear liquids, such as water, clear fruit juice, black coffee, plain tea, and sports drinks. ?Do not drink energy drinks or alcohol. ?2 hours before your procedure ?Stop drinking all liquids. ?You may be allowed to take medicines with small sips of water. ?If you do not follow your health care provider's instructions, your procedure may be delayed or canceled. ?Medicines ?Ask your health care provider about: ?Changing or stopping your regular medicines. This is especially important if you are taking diabetes medicines or blood thinners. ?Taking medicines such as aspirin and ibuprofen. These medicines can thin your blood. Do not take these medicines unless your health care provider tells you to take them. ?Taking over-the-counter medicines, vitamins, herbs, and supplements. ?Surgery safety ?Ask your health care provider what steps will be taken to help prevent infection. These steps may include: ?Removing hair at the surgery site. ?Washing skin with a germ-killing soap. ?Taking antibiotic medicine. ?General instructions ?Do not use any products that contain nicotine or tobacco for at least 4 weeks before the procedure. These products include cigarettes, chewing tobacco, and vaping devices, such as e-cigarettes. If you need help quitting, ask your health care provider. ?If you will be going home right after the procedure, plan to have a responsible adult: ?Take you home from the hospital or clinic. You   will not be allowed to drive. ?Care for you for the time you are told. ?What happens during the procedure? ? ?An  IV will be inserted into one of your veins. ?You will be given one or more of the following: ?A medicine to help you relax (sedative). ?A medicine to make you fall asleep (general anesthetic). ?A medicine that is injected into your spine to numb the area below and slightly above the injection site (spinal anesthetic). ?Your legs will be placed in foot rests (stirrups) so that your legs are apart and your knees are bent. ?The resectoscope will be passed through your urethra to your prostate. ?Parts of your prostate will be resected using the cutting edge of the resectoscope. ?Fluid will be passed to rinse out the cut tissues (irrigation). ?The resectoscope will be removed. ?A small, thin tube (catheter) will be passed through your urethra and into your bladder. The catheter will drain urine into a bag outside of your body. ?The procedure may vary among health care providers and hospitals. ?What happens after the procedure? ?Your blood pressure, heart rate, breathing rate, and blood oxygen level will be monitored until you leave the hospital or clinic. ?You will be given fluids through the IV. The IV will be removed when you start eating and drinking normally. ?You may have some pain. Pain medicine will be available to help you. ?You will have a catheter draining your urine. ?You may have blood in your urine. Your catheter may be kept in until your urine is clear. ?Your urinary drainage will be monitored. If necessary, your bladder may be rinsed out (irrigated) through your catheter. ?You will be encouraged to walk around as soon as possible. ?You may have to wear compression stockings. These stockings help to prevent blood clots and reduce swelling in your legs. ?If you were given a sedative during the procedure, it can affect you for several hours. Do not drive or operate machinery until your health care provider says that it is safe. ?Summary ?Transurethral resection of the prostate (TURP) is the removal  (resection) of part of the prostate tissue. ?The goal of this procedure is to remove enough prostate tissue to allow for a normal flow of urine. ?Follow instructions from your health care provider about taking medicines and about eating and drinking before the procedure. ?This information is not intended to replace advice given to you by your health care provider. Make sure you discuss any questions you have with your health care provider. ?Document Revised: 12/22/2020 Document Reviewed: 12/22/2020 ?Elsevier Patient Education ? 2023 Elsevier Inc. ? ?

## 2021-11-23 NOTE — Progress Notes (Signed)
    11/23/2021 CC: No chief complaint on file.    HPI: [name] is a 61 y.o. male with a personal history of BPH with LUTS and glucosuria who presents today for diagnostic cystoscopy.     He has a personal history of BPH managed on Flomax since 2015.  He believes he may have been seen by Dr. Sheppard Penton at that time and told his prostate was "swollen".   He was seen by his PCP, Dr Christiana Pellant on 09/28/2021. He was noted to have chronic problems with BPH.    His most recent PSA on 09/28/2021 was 1.4.   There were no vitals filed for this visit. NED. A&Ox3.   No respiratory distress   Abd soft, NT, ND Normal phallus with bilateral descended testicles  Cystoscopy Procedure Note  Patient identification was confirmed, informed consent was obtained, and patient was prepped using Betadine solution.  Lidocaine jelly was administered per urethral meatus.     Pre-Procedure: - Inspection reveals a normal caliber ureteral meatus.  Procedure: The flexible cystoscope was introduced without difficulty - No urethral strictures/lesions are present. - Normal prostate median lobe  - Normal bladder neck - Bilateral ureteral orifices identified - Bladder mucosa  reveals no ulcers, tumors, or lesions - No bladder stones - No trabeculation  Retroflexion shows moderate size well circumscribed median lobe    Post-Procedure: - Patient tolerated the procedure well   Prostate transrectal ultrasound sizing   Informed consent was obtained after discussing risks/benefits of the procedure.  A time out was performed to ensure correct patient identity.   Pre-Procedure: -Transrectal probe was placed without difficulty -Transrectal Ultrasound performed revealing a 41.8  gm prostate measuring 5.2 x 3.24 x 4.74 cm (length) -No significant hypoechoic  Assessment/ Plan: BPH with LUTS  - Cystoscopy revealed median lobe but otherwise unremarkable  - We discussed outlet procedure such as a TURP  - Risk include  infection, damage to bladder or surrounding structures, retrograde ejaculation - He would like to proceed with TURP    No follow-ups on file.  I,Kailey Littlejohn,acting as a Neurosurgeon for Vanna Scotland, MD.,have documented all relevant documentation on the behalf of Vanna Scotland, MD,as directed by  Vanna Scotland, MD while in the presence of Vanna Scotland, MD.

## 2021-11-24 ENCOUNTER — Other Ambulatory Visit: Payer: Self-pay | Admitting: Urology

## 2021-11-24 DIAGNOSIS — N138 Other obstructive and reflux uropathy: Secondary | ICD-10-CM

## 2021-11-24 NOTE — Progress Notes (Signed)
Surgical Physician Order Form [facility]  * Scheduling expectation : Next Available  *Length of Case:   *Clearance needed: no  *Anticoagulation Instructions: Hold all anticoagulants  *Aspirin Instructions: Hold Aspirin  *Post-op visit Date/Instructions:  1-3 day voiding trial  *Diagnosis: BPH w/BOO  *Procedure:      TURP   Additional orders: N/A  -Admit type: OUTpatient  -Anesthesia: General  -VTE Prophylaxis Standing Order SCD's       Other:   -Standing Lab Orders Per Anesthesia    Lab other: UA&Urine Culture  -Standing Test orders EKG/Chest x-ray per Anesthesia       Test other:   - Medications:  Ancef 2gm IV  -Other orders:  N/A

## 2021-11-26 ENCOUNTER — Encounter: Payer: Self-pay | Admitting: Urology

## 2021-11-28 LAB — CULTURE, URINE COMPREHENSIVE

## 2021-11-29 ENCOUNTER — Encounter: Payer: Self-pay | Admitting: Urology

## 2021-12-01 ENCOUNTER — Telehealth: Payer: Self-pay

## 2021-12-01 NOTE — Progress Notes (Signed)
 Urological Surgery Posting Form   Surgery Date/Time: Date: 01/10/2022  Surgeon: Dr. 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

## 2021-12-01 NOTE — Telephone Encounter (Signed)
I spoke with [name]. We have discussed possible surgery dates and Monday October 2nd, 2023 was agreed upon by all parties. Patient given information about surgery date, what to expect pre-operatively and post operatively.  We discussed that a Pre-Admission Testing office will be calling to set up the pre-op visit that will take place prior to surgery, and that these appointments are typically done over the phone with a Pre-Admissions RN.  Informed patient that our office will communicate any additional care to be provided after surgery. Patients questions or concerns were discussed during our call. Advised to call our office should there be any additional information, questions or concerns that arise. Patient verbalized understanding.

## 2021-12-06 ENCOUNTER — Ambulatory Visit: Payer: Self-pay

## 2021-12-06 NOTE — Telephone Encounter (Signed)
FYI for appt tomorrow

## 2021-12-06 NOTE — Telephone Encounter (Signed)
  Chief Complaint: Pain on left side from fall Symptoms: Pain 10/10 Frequency: 3 am taking dogs out Pertinent Negatives: Patient denies Break in skin Disposition: [x] ED /[x] Urgent Care (no appt availability in office) / [] Appointment(In office/virtual)/ []  [place] [facility]/ [] Home Care/ [x] Refused Recommended Disposition /[] [facility]/ []  Follow-up with PCP Additional Notes: Pt fell last night about 3am while carrying his dogs to go outside. Pt was on a handicap ramp with a mat on it. The mat slipped and he fell. He fell on his left side. He hit his hip and arm. He did not think he was injured. Today he has pain of 10/10 on his side and back. Pt declines UC and ED. Pt has taken appt. In office for tomorrow.   Answer Assessment - Initial Assessment Questions 1. MECHANISM: "How did the fall happen?"     Slipped 2. DOMESTIC VIOLENCE AND ELDER ABUSE SCREENING: "Did you fall because someone pushed you or tried to hurt you?" If Yes, ask: "Are you safe now?"     no 3. ONSET: "When did the fall happen?" (e.g., minutes, hours, or days ago)     Slipped on map on top of ramp. Mat moved.  4. LOCATION: "What part of the body hit the ground?" (e.g., back, buttocks, head, hips, knees, hands, head, stomach)     Left side arm and hip 5. INJURY: "Did you hurt (injure) yourself when you fell?" If Yes, ask: "What did you injure? Tell me more about this?" (e.g., body area; type of injury; pain severity)"     Yes left side, Back hip and arm 6. PAIN: "Is there any pain?" If Yes, ask: "How bad is the pain?" (e.g., Scale 1-10; or mild,  moderate, severe)   - NONE (0): No pain   - MILD (1-3): Doesn't interfere with normal activities    - MODERATE (4-7): Interferes with normal activities or awakens from sleep    - SEVERE (8-10): Excruciating pain, unable to do any normal activities      10/10 7. SIZE: For cuts, bruises, or swelling, ask: "How large is it?" (e.g., inches or centimeters)      no 8.  PREGNANCY: "Is there any chance you are pregnant?" "When was your last menstrual period?"     na 9. OTHER SYMPTOMS: "Do you have any other symptoms?" (e.g., dizziness, fever, weakness; new onset or worsening).      no 10. CAUSE: "What do you think caused the fall (or falling)?" (e.g., tripped, dizzy spell)       Water under mat  Protocols used: Falls and [facility]

## 2021-12-07 ENCOUNTER — Ambulatory Visit
Admission: RE | Admit: 2021-12-07 | Discharge: 2021-12-07 | Disposition: A | Discharge status: Home / Self Care | Payer: BC Managed Care – PPO | Attending: Physician Assistant | Admitting: Physician Assistant

## 2021-12-07 ENCOUNTER — Ambulatory Visit
Admission: RE | Admit: 2021-12-07 | Discharge: 2021-12-07 | Disposition: A | Discharge status: Home / Self Care | Payer: BC Managed Care – PPO | Source: Ambulatory Visit | Attending: Physician Assistant | Admitting: Physician Assistant

## 2021-12-07 ENCOUNTER — Ambulatory Visit: Payer: BC Managed Care – PPO | Admitting: Physician Assistant

## 2021-12-07 ENCOUNTER — Encounter: Payer: Self-pay | Admitting: Physician Assistant

## 2021-12-07 VITALS — BP 112/75 | HR 57 | Temp 97.7°F | Ht 75.0 in | Wt 225.0 lb

## 2021-12-07 DIAGNOSIS — S79912A Unspecified injury of left hip, initial encounter: Secondary | ICD-10-CM | POA: Diagnosis not present

## 2021-12-07 DIAGNOSIS — M545 Low back pain, unspecified: Secondary | ICD-10-CM | POA: Diagnosis not present

## 2021-12-07 DIAGNOSIS — M25552 Pain in left hip: Secondary | ICD-10-CM

## 2021-12-07 DIAGNOSIS — W010XXA Fall on same level from slipping, tripping and stumbling without subsequent striking against object, initial encounter: Secondary | ICD-10-CM | POA: Diagnosis not present

## 2021-12-07 DIAGNOSIS — M79605 Pain in left leg: Secondary | ICD-10-CM | POA: Diagnosis not present

## 2021-12-07 DIAGNOSIS — M7989 Other specified soft tissue disorders: Secondary | ICD-10-CM | POA: Insufficient documentation

## 2021-12-07 DIAGNOSIS — S59902A Unspecified injury of left elbow, initial encounter: Secondary | ICD-10-CM | POA: Diagnosis not present

## 2021-12-07 DIAGNOSIS — R2232 Localized swelling, mass and lump, left upper limb: Secondary | ICD-10-CM | POA: Diagnosis not present

## 2021-12-07 DIAGNOSIS — M25522 Pain in left elbow: Secondary | ICD-10-CM | POA: Insufficient documentation

## 2021-12-07 DIAGNOSIS — W19XXXA Unspecified fall, initial encounter: Secondary | ICD-10-CM | POA: Diagnosis not present

## 2021-12-07 DIAGNOSIS — Y92009 Unspecified place in unspecified non-institutional (private) residence as the place of occurrence of the external cause: Secondary | ICD-10-CM

## 2021-12-07 DIAGNOSIS — S3993XA Unspecified injury of pelvis, initial encounter: Secondary | ICD-10-CM | POA: Diagnosis not present

## 2021-12-07 MED ORDER — DICLOFENAC SODIUM 75 MG PO TBEC: 75.0000 mg | DELAYED_RELEASE_TABLET | Freq: Two times a day (BID) | ORAL | 0 refills | Status: DC

## 2021-12-07 NOTE — Progress Notes (Signed)
Elbow imaging shows some soft tissue swelling but no acute fractures or dislocation.

## 2021-12-07 NOTE — Progress Notes (Signed)
Acute Office Visit   Patient: [name]   DOB: [birth year]   61 y.o. Male  MRN: [id number] Visit Date: 12/07/2021  Today's healthcare provider: Oswaldo Conroy Grady Lucci, PA-C  Introduced myself to the patient as a Secondary school teacher and provided education on APPs in clinical practice.    Chief Complaint  Patient presents with   Fall    Yesterday morning. Pain in mostly on his left side.   Subjective    Fall   HPI     Fall    Additional comments: Yesterday morning. Pain in mostly on his left side.      Last edited by Sherolyn Buba, CMA on 12/07/2021  8:12 AM.      Fall with left sided pain Reports he fell down yesterday morning around 3 AM while carrying his dogs down his wheelchair ramp States he slipped on a mat and hit his left side Denies hitting head, LOC,  Has a hx of fractures from mild injuries   Pain level: 10/10 - has gotten better since yesterday  Location: left hip, left arm and left hand  Radiation: down left leg to mid calf Aggravating: bending over or stretching  Alleviating: Lidocaine patches,  Interventions: Ibuprofen, Tylenol, Lidocaine patches  Reports reduced ability to walk yesterday - has mildly improved today    Medications: Outpatient Medications Prior to Visit  Medication Sig   aspirin 81 MG chewable tablet Chew 81 mg by mouth daily.   atorvastatin (LIPITOR) 40 MG tablet TAKE 1 TABLET BY MOUTH ONCE DAILY   ENTRESTO 24-26 MG TAKE 1 TABLET BY MOUTH TWICE DAILY   famotidine (PEPCID) 20 MG tablet One an hour before bedtime   JARDIANCE 10 MG TABS tablet TAKE 1 TABLET BY MOUTH ONCE EVERY MORNING WITH BREAKFAST   metoprolol tartrate (LOPRESSOR) 25 MG tablet Take 1 tablet (25 mg total) by mouth 2 (two) times daily.   omeprazole (PRILOSEC) 20 MG capsule Take 2  x 30-60   min before first meal   spironolactone (ALDACTONE) 25 MG tablet Take 0.5 tablets (12.5 mg total) by mouth daily.   tamsulosin (FLOMAX) 0.4 MG CAPS capsule Take 1 capsule (0.4 mg total) by  mouth daily.   No facility-administered medications prior to visit.    Review of Systems  Musculoskeletal:  Positive for arthralgias.  Neurological:  Negative for syncope.       Objective    BP 112/75   Pulse (!) 57   Temp 97.7 F (36.5 C) (Oral)   Ht 6\' 3"  (1.905 m)   Wt 225 lb (102.1 kg)   SpO2 97%   BMI 28.12 kg/m    Physical Exam Vitals reviewed.  Constitutional:      Appearance: Normal appearance. He is normal weight.  HENT:     Head: Normocephalic and atraumatic.  Eyes:     Extraocular Movements: Extraocular movements intact.     Conjunctiva/sclera: Conjunctivae normal.  Musculoskeletal:     Left elbow: No swelling or deformity. Tenderness present in olecranon process.     Right wrist: Normal pulse.     Left wrist: Normal pulse.     Left hand: Tenderness present. Normal range of motion. Decreased strength of thumb/finger opposition. Normal strength of finger abduction and wrist extension. Normal sensation. Normal capillary refill.     Cervical back: Normal range of motion and neck supple.     Right hip: No tenderness or crepitus. Normal range of motion.  Left hip: Tenderness present. Decreased range of motion.     Right knee: No swelling or deformity. Normal range of motion. No tenderness.     Left knee: No swelling or deformity. Normal range of motion. No tenderness.     Comments: Popping and crepitus in left elbow with flexion   Tenderness of left hand over first metacarpal - no deformity palpated, no swelling or bruising observed.   Left hip: Positive log roll. Attempted to discern ROM with flexion, internal rotation and external rotation but pt expressed significant pain with the beginning of these motions and would not allow more extensive passive movement.   Neurological:     General: No focal deficit present.     Mental Status: He is alert and oriented to person, place, and time.     GCS: GCS eye subscore is 4. GCS verbal subscore is 5. GCS motor  subscore is 6.     Cranial Nerves: Cranial nerves 2-12 are intact. No dysarthria or facial asymmetry.     Gait: Gait abnormal.     Comments: Antalgic gait   Psychiatric:        Attention and Perception: Attention and perception normal.        Mood and Affect: Mood and affect normal.        Speech: Speech normal.        Behavior: Behavior normal. Behavior is cooperative.       No results found for any visits on 12/07/21.  Assessment & Plan      No follow-ups on file.        Problem List Items Addressed This Visit   None Visit Diagnoses     Posterior pain of left hip    -  Primary Acute, new concern Reports fall two days ago and landed on left hip, elbow, and hand Reports severe pain in left hip Xrays did not demonstrate acute bony injury Recommend conservative management with Tylenol, warm compresses, rest and gentle stretches as tolerated Will provide Diclofenac 75 mg PO BID PRN to assist with pain  Follow up as needed  IF pain and symptoms are not improving after 2-4 weeks of conservative therapy will place referral to Ortho and potentially PT for further eval and management    Relevant Orders   DG Hip Unilat W OR W/O Pelvis Min 4 Views Left   Fall as cause of accidental injury in home as place of occurrence, initial encounter   Acute, new concern Reports falling 2 days ago onto left side with pain in left elbow, left hand and left hip Imaging of lumbar spine, left hip and left elbow did not demonstrate acute fracture or dislocations  Recommend conservative management at this time: Tylenol and Diclofenac for pain, warm compresses, rest, gentle stretches as tolerated to prevent stiffness If not improving in 2-4 weeks, recommend follow up in office to reassess.       Relevant Orders   DG Hip Unilat W OR W/O Pelvis Min 4 Views Left   DG Lumbar Spine Complete   DG Elbow Complete Left        No follow-ups on file.   I, Preston Garabedian E Raffaele Derise, PA-C, have reviewed all  documentation for this visit. The documentation on 12/07/21 for the exam, diagnosis, procedures, and orders are all accurate and complete.   Jacquelin Hawking, MHS, PA-C [facility]

## 2021-12-07 NOTE — Progress Notes (Signed)
Imaging of the lower back and spine did not demonstrate acute bony injury or fracture.

## 2021-12-07 NOTE — Progress Notes (Signed)
Left hip is not dislocated or fractured. Recommend he continue with Ibuprofen, Tylenol for pain, along with warm compresses, and gentle stretches as he is able. Recovery from an injury like this can take time. If he is still having severe pain and reduced ability to walk in 2-4 weeks, he should return to the office for follow up.  He can take up to 3500 mg of Tylenol per 24 hours. I will send in a script for Diclofenac for him to take instead of Ibuprofen. No other NSAIDs while taking the Diclofenac though.

## 2021-12-30 ENCOUNTER — Other Ambulatory Visit: Payer: BC Managed Care – PPO

## 2021-12-30 DIAGNOSIS — N138 Other obstructive and reflux uropathy: Secondary | ICD-10-CM | POA: Diagnosis not present

## 2021-12-30 DIAGNOSIS — N401 Enlarged prostate with lower urinary tract symptoms: Secondary | ICD-10-CM | POA: Diagnosis not present

## 2021-12-30 LAB — URINALYSIS, COMPLETE
Bilirubin, UA: NEGATIVE
Ketones, UA: NEGATIVE
Leukocytes,UA: NEGATIVE
Nitrite, UA: NEGATIVE
Protein,UA: NEGATIVE
RBC, UA: NEGATIVE
Specific Gravity, UA: 1.02 (ref 1.005–1.030)
Urobilinogen, Ur: 0.2 mg/dL (ref 0.2–1.0)
pH, UA: 5 (ref 5.0–7.5)

## 2021-12-30 LAB — MICROSCOPIC EXAMINATION

## 2022-01-03 ENCOUNTER — Other Ambulatory Visit: Payer: Self-pay | Admitting: Internal Medicine

## 2022-01-03 ENCOUNTER — Encounter
Admission: RE | Admit: 2022-01-03 | Discharge: 2022-01-03 | Disposition: A | Discharge status: Home / Self Care | Payer: BC Managed Care – PPO | Source: Ambulatory Visit | Attending: Urology | Admitting: Urology

## 2022-01-03 ENCOUNTER — Telehealth: Payer: Self-pay | Admitting: *Deleted

## 2022-01-03 ENCOUNTER — Other Ambulatory Visit: Payer: Self-pay

## 2022-01-03 DIAGNOSIS — Z01812 Encounter for preprocedural laboratory examination: Secondary | ICD-10-CM

## 2022-01-03 HISTORY — DX: Angina pectoris, unspecified: I20.9

## 2022-01-03 HISTORY — DX: Essential (primary) hypertension: I10

## 2022-01-03 NOTE — Telephone Encounter (Signed)
I left a message for the patient to call the office to scheduled a telephone call with the pre-op team.

## 2022-01-03 NOTE — Telephone Encounter (Signed)
   Name: [name]  DOB: [birth year]  MRN: [id number]  Primary Cardiologist: None   Preoperative team, please contact this patient and set up a phone call appointment for further preoperative risk assessment. Please obtain consent and complete medication review. Thank you for your help.  I confirm that guidance regarding antiplatelet and oral anticoagulation therapy has been completed and, if necessary, noted below.  Regarding ASA therapy, we recommend continuation of ASA throughout the perioperative period.  However, if the surgeon feels that cessation of ASA is required in the perioperative period, and patient is without any new or concerning symptoms at the time of his virtual visit, it may be stopped 5-7 days prior to surgery with a plan to resume it as soon as felt to be feasible from a surgical standpoint in the post-operative period.   Lenna Sciara, NP 01/03/2022, 5:07 PM [place]

## 2022-01-03 NOTE — Patient Instructions (Addendum)
Your procedure is scheduled on: 01/10/22 - Monday Report to the Registration Desk on the 1st floor of the [facility]. To find out your arrival time, please call [phone] between 1PM - 3PM on: 01/07/22 - Friday If your arrival time is 6:00 am, do not arrive prior to that time as the [facility] entrance doors do not open until 6:00 am.  REMEMBER: Instructions that are not followed completely may result in serious medical risk, up to and including death; or upon the discretion of your surgeon and anesthesiologist your surgery may need to be rescheduled.  Do not eat food or drinking any liquids after midnight the night before surgery.  No gum chewing, lozengers or hard candies.  TAKE ONLY THESE MEDICATIONS THE MORNING OF SURGERY WITH A SIP OF WATER:  - metoprolol tartrate  - omeprazole (PRILOSEC)  - tamsulosin (FLOMAX)   HOLD JARDIANCE 3 days prior to your procedure.  HOLD Aspirin 5 days prior to your procedure.  One week prior to surgery: diclofenac (VOLTAREN)  Stop Anti-inflammatories (NSAIDS) such as Advil, Aleve, Ibuprofen, Motrin, Naproxen, Naprosyn and Aspirin based products such as Excedrin, Goodys Powder, BC Powder.  Stop ANY OVER THE COUNTER supplements until after surgery.  You may however, continue to take Tylenol if needed for pain up until the day of surgery.  No Alcohol for 24 hours before or after surgery.  No Smoking including e-cigarettes for 24 hours prior to surgery.  No chewable tobacco products for at least 6 hours prior to surgery.  No nicotine patches on the day of surgery.  Do not use any "recreational" drugs for at least a week prior to your surgery.  Please be advised that the combination of cocaine and anesthesia may have negative outcomes, up to and including death. If you test positive for cocaine, your surgery will be cancelled.  On the morning of surgery brush your teeth with toothpaste and water, you may rinse your mouth with mouthwash if  you wish. Do not swallow any toothpaste or mouthwash.  Do not wear jewelry, make-up, hairpins, clips or nail polish.  Do not wear lotions, powders, or perfumes.   Do not shave body from the neck down 48 hours prior to surgery just in case you cut yourself which could leave a site for infection.  Also, freshly shaved skin may become irritated if using the CHG soap.  Contact lenses, hearing aids and dentures may not be worn into surgery.  Do not bring valuables to the hospital. [facility] is not responsible for any missing/lost belongings or valuables.   Notify your doctor if there is any change in your medical condition (cold, fever, infection).  Wear comfortable clothing (specific to your surgery type) to the hospital.  After surgery, you can help prevent lung complications by doing breathing exercises.  Take deep breaths and cough every 1-2 hours. Your doctor may order a device called an Incentive Spirometer to help you take deep breaths. When coughing or sneezing, hold a pillow firmly against your incision with both hands. This is called "splinting." Doing this helps protect your incision. It also decreases belly discomfort.  If you are being admitted to the hospital overnight, leave your suitcase in the car. After surgery it may be brought to your room.  If you are being discharged the day of surgery, you will not be allowed to drive home. You will need a responsible adult (18 years or older) to drive you home and stay with you that night.  If you are taking public transportation, you will need to have a responsible adult (18 years or older) with you. Please confirm with your physician that it is acceptable to use public transportation.   Please call the Pre-admissions Testing Dept. at [phone] if you have any questions about these instructions.  Surgery Visitation Policy:  Patients undergoing a surgery or procedure may have two family members or support persons with  them as long as the person is not COVID-19 positive or experiencing its symptoms.   Inpatient Visitation:    Visiting hours are 7 a.m. to 8 p.m. Up to four visitors are allowed at one time in a patient room, including children. The visitors may rotate out with other people during the day. One designated support person (adult) may remain overnight.

## 2022-01-03 NOTE — Telephone Encounter (Signed)
-----   Message from Karen Kitchens, NP sent at 01/03/2022  1:33 PM EDT ----- Regarding: Request for pre-operative cardiac clearance Request for pre-operative cardiac clearance:  1. What type of surgery is being performed?  TRANSURETHRAL RESECTION OF THE PROSTATE (TURP)  2. When is this surgery scheduled?  01/10/2022  3. Type of clearance being requested (medical, pharmacy, both). BOTH   4. Are there any medications that need to be held prior to surgery? ASA  5. Practice name and name of physician performing surgery?  Performing surgeon: Dr. Hollice Espy, MD Requesting clearance: Honor Loh, FNP-C    6. Anesthesia type (none, local, MAC, general)? GENERAL  7. What is the office phone and fax number?   Phone: [phone] Fax: [phone]  ATTENTION: Unable to create telephone message as per your standard workflow. Directed by [facility] providers to send requests for cardiac clearance to this pool for appropriate distribution to provider covering pre-operative clearances.   Honor Loh, MSN, APRN, FNP-C, CEN [facility]  Peri-operative Services Nurse Practitioner Phone: [phone] 01/03/22 1:33 PM

## 2022-01-04 ENCOUNTER — Ambulatory Visit: Payer: BC Managed Care – PPO | Admitting: Unknown Physician Specialty

## 2022-01-04 NOTE — Telephone Encounter (Signed)
We are trying to work on appts, so the pt is not charged for 2 appts 6 days apart. Pt has a VV 10/5 with Dr. [name] in the [place] office. Surgery with Dr. Hollice Espy is set for 01/10/22. See message below from Laurann Montana, NP sent to [place], [place] scheduler to see if she can help out with the appt. I also sent a staff message to the [facility] scheduling team as well.   Any help s much appreciated.     From: Laurann Montana, NP [facility] - pt currently has virtual with [name] 10/5 at [place]. Could we use the 9/28 appt at [facility] for [name] to do his VV early to help with preop clearance?   trying to avoid him having to do a virtual visit with me for preop then again in a week with [name]. just to avoid multiple billings. If not, that is okay and we can do as two visits

## 2022-01-05 ENCOUNTER — Encounter
Admission: RE | Admit: 2022-01-05 | Discharge: 2022-01-05 | Disposition: A | Discharge status: Home / Self Care | Payer: BC Managed Care – PPO | Source: Ambulatory Visit | Attending: Urology | Admitting: Urology

## 2022-01-05 DIAGNOSIS — Z01812 Encounter for preprocedural laboratory examination: Secondary | ICD-10-CM | POA: Insufficient documentation

## 2022-01-05 LAB — BASIC METABOLIC PANEL
Anion gap: 5 (ref 5–15)
BUN: 20 mg/dL (ref 8–23)
CO2: 28 mmol/L (ref 22–32)
Calcium: 8.9 mg/dL (ref 8.9–10.3)
Chloride: 107 mmol/L (ref 98–111)
Creatinine, Ser: 0.97 mg/dL (ref 0.61–1.24)
GFR, Estimated: >60 mL/min (ref >60)
Glucose, Bld: 101 mg/dL — ABNORMAL HIGH (ref 70–99)
Potassium: 4 mmol/L (ref 3.5–5.1)
Sodium: 140 mmol/L (ref 135–145)

## 2022-01-05 LAB — CBC
HCT: 47.7 % (ref 39.0–52.0)
Hemoglobin: 15.6 g/dL (ref 13.0–17.0)
MCH: 28.9 pg (ref 26.0–34.0)
MCHC: 32.7 g/dL (ref 30.0–36.0)
MCV: 88.5 fL (ref 80.0–100.0)
Platelets: 254 10*3/uL (ref 150–400)
RBC: 5.39 MIL/uL (ref 4.22–5.81)
RDW: 13.2 % (ref 11.5–15.5)
WBC: 7 10*3/uL (ref 4.0–10.5)
nRBC: 0 % (ref 0.0–0.2)

## 2022-01-05 LAB — CULTURE, URINE COMPREHENSIVE

## 2022-01-06 ENCOUNTER — Telehealth: Payer: Self-pay | Admitting: Internal Medicine

## 2022-01-06 ENCOUNTER — Encounter: Payer: Self-pay | Admitting: Physician Assistant

## 2022-01-06 ENCOUNTER — Encounter: Payer: Self-pay | Admitting: Urology

## 2022-01-06 ENCOUNTER — Ambulatory Visit (INDEPENDENT_AMBULATORY_CARE_PROVIDER_SITE_OTHER): Payer: BC Managed Care – PPO | Admitting: Physician Assistant

## 2022-01-06 ENCOUNTER — Ambulatory Visit: Payer: BC Managed Care – PPO | Admitting: Internal Medicine

## 2022-01-06 VITALS — BP 95/56 | HR 59 | Temp 98.8°F | Ht 73.4 in | Wt 226.8 lb

## 2022-01-06 DIAGNOSIS — I5022 Chronic systolic (congestive) heart failure: Secondary | ICD-10-CM

## 2022-01-06 DIAGNOSIS — I471 Supraventricular tachycardia: Secondary | ICD-10-CM

## 2022-01-06 DIAGNOSIS — Z0000 Encounter for general adult medical examination without abnormal findings: Secondary | ICD-10-CM | POA: Diagnosis not present

## 2022-01-06 DIAGNOSIS — Z131 Encounter for screening for diabetes mellitus: Secondary | ICD-10-CM

## 2022-01-06 DIAGNOSIS — E782 Mixed hyperlipidemia: Secondary | ICD-10-CM | POA: Diagnosis not present

## 2022-01-06 DIAGNOSIS — Z23 Encounter for immunization: Secondary | ICD-10-CM

## 2022-01-06 DIAGNOSIS — I428 Other cardiomyopathies: Secondary | ICD-10-CM

## 2022-01-06 NOTE — Telephone Encounter (Signed)
Patient states he was supposed to have a virtual appointment, but that he waited on mychart for 40 minutes and never got speak with anyone. He states he logged off, because he is at work. He states he is not sure what he needs to do, because he has a procedure.

## 2022-01-06 NOTE — Progress Notes (Signed)
Annual Physical Exam  Name: [name]   MRN: [id number]    DOB: [birth year]   Date:01/06/2022 Today's Provider: Talitha Givens, MHS, PA-C Introduced myself to the patient as a PA-C and provided education on APPs in clinical practice.     Subjective  Chief Complaint  Chief Complaint  Patient presents with   Annual Exam    HPI  Patient presents for annual CPE.  Diet: Overall normal diet. Mix of protein, carbs, vegetables.  Exercise: he does not engage in regular exercise, states he walks about 10 miles per day at work Sleep: "it's okay- I have to get up a lot for the restroom" avg of 5 hours per night. Does not feel well rested in the AM Mood: "it's been okay, I'm a little anxious about the surgery Monday"   Depression: phq 9 is negative    01/06/2022    8:27 AM 12/07/2021    8:17 AM 09/28/2021    4:10 PM 10/15/2020    8:16 AM 09/14/2020    8:14 AM  Depression screen PHQ 2/9  Decreased Interest 0 1 1 0 0  Down, Depressed, Hopeless 0 1 1 0 0  PHQ - 2 Score 0 2 2 0 0  Altered sleeping 0 0 1  0  Tired, decreased energy 0 1 1  0  Change in appetite 0 0 0  0  Feeling bad or failure about yourself  0 1 1  0  Trouble concentrating 0 0 1  0  Moving slowly or fidgety/restless 0 0 0  0  Suicidal thoughts 0 0 0  0  PHQ-9 Score 0 4 6  0  Difficult doing work/chores Not difficult at all Somewhat difficult Somewhat difficult  Not difficult at all    Hypertension:  BP Readings from Last 3 Encounters:  01/06/22 (!) 95/56  12/07/21 112/75  11/23/21 119/79    Obesity: Wt Readings from Last 3 Encounters:  01/06/22 226 lb 12.8 oz (102.9 kg)  12/07/21 225 lb (102.1 kg)  11/23/21 222 lb (100.7 kg)   BMI Readings from Last 3 Encounters:  01/06/22 29.60 kg/m  12/07/21 28.12 kg/m  11/23/21 27.75 kg/m     Lipids:  Lab Results  Component Value Date   CHOL 147 09/28/2021   CHOL 186 11/20/2020   CHOL 208 (H) 08/25/2020   Lab Results  Component Value Date   HDL 58  09/28/2021   HDL 47 11/20/2020   HDL 49 08/25/2020   Lab Results  Component Value Date   LDLCALC 72 09/28/2021   LDLCALC 124 (H) 11/20/2020   LDLCALC 140 (H) 08/25/2020   Lab Results  Component Value Date   TRIG 94 09/28/2021   TRIG 79 11/20/2020   TRIG 107 08/25/2020   Lab Results  Component Value Date   CHOLHDL 2.5 09/28/2021   CHOLHDL 4.0 11/20/2020   CHOLHDL 4.2 08/25/2020   No results found for: "LDLDIRECT" Glucose:  Glucose  Date Value Ref Range Status  10/16/2013 90 65 - 99 mg/dL Final  10/15/2013 88 65 - 99 mg/dL Final   Glucose, Bld  Date Value Ref Range Status  01/05/2022 101 (H) 70 - 99 mg/dL Final    Comment:    Glucose reference range applies only to samples taken after fasting for at least 8 hours.  08/12/2021 100 (H) 70 - 99 mg/dL Final    Comment:    Glucose reference range applies only to samples taken after  fasting for at least 8 hours.  07/30/2021 86 70 - 99 mg/dL Final    Comment:    Glucose reference range applies only to samples taken after fasting for at least 8 hours.      Significant Other STD testing and prevention (HIV/chl/gon/syphilis):  no Hep C Screening:  Skin cancer: Discussed monitoring for atypical lesions Colorectal cancer: Up to date. Next due in 2032 Prostate cancer:  Having prostate surgery -TERP in the next few days.  No results found for: "PSA"   Lung cancer:  Low Dose CT Chest recommended if Age 65-80 years, 30 pack-year currently smoking OR have quit w/in 15years. Patient  not applicable AAA: The USPSTF recommends one-time screening with ultrasonography in men ages 60 to 21 years who have ever smoked. Patient:  not applicable ECG:  NA  Vaccines:   HPV: aged out  Tdap: Discussed importance of this for prevention of disease.  Shingrix: Needs to be completed- open to getting this done after upcoming surgery. Pneumonia: Not indicated yet  Flu: Received this Sunday at [facility]- will confirm with vaccine records COVID-19:  Has gotten most recent booster on Sunday. Has completed full course of offered vaccines. - will update with records   Advanced Care Planning: A voluntary discussion about advance care planning including the explanation and discussion of advance directives.  Discussed health care proxy and Living will, and the patient was able to identify a health care proxy as no one.  Patient does not have a living will.   Patient Active Problem List   Diagnosis Date Noted   Family history of blood clots 10/25/2021   Lower urinary tract symptoms (LUTS) 09/28/2021   Family history of blood coagulation disorder 09/28/2021   Coronary artery disease due to lipid rich plaque 09/28/2021   DOE (dyspnea on exertion) 02/03/2021   Atypical chest pain 12/24/2020   Palpitation 12/24/2020   Mixed hyperlipidemia 12/24/2020   Family history of colon cancer    Fatigue due to sleep pattern disturbance 10/16/2013   Prostatism 10/16/2013    Past Surgical History:  Procedure Laterality Date   COLONOSCOPY WITH PROPOFOL N/A 11/24/2020   Procedure: COLONOSCOPY WITH PROPOFOL;  Surgeon: Lucilla Lame, MD;  Location[facility] ENDOSCOPY;  Service: Endoscopy;  Laterality: N/A;   LEFT HEART CATHETERIZATION WITH CORONARY ANGIOGRAM N/A 10/17/2013   Procedure: LEFT HEART CATHETERIZATION WITH CORONARY ANGIOGRAM;  Surgeon: Burnell Blanks, MD;  Location: [facility] CATH LAB;  Service: Cardiovascular;  Laterality: N/A;   LOOP RECORDER IMPLANT  12-09-13   MDT LINQ implanted by Dr [name] for syncope   LOOP RECORDER IMPLANT N/A 12/09/2013   Procedure: LOOP RECORDER IMPLANT;  Surgeon: Deboraha Sprang, MD;  Location: [facility] CATH LAB;  Service: Cardiovascular;  Laterality: N/A;    Family History  Problem Relation Age of Onset   Hypertension Mother    Arrhythmia Mother    Heart disease Mother    Parkinson's disease Mother    Alzheimer's disease Mother    Deep vein thrombosis Father    Colon cancer Father    Sudden death Maternal Grandfather 64   ADD  / ADHD Paternal Aunt    Deep vein thrombosis Paternal Aunt    Deep vein thrombosis Paternal Uncle     Social History   Socioeconomic History   Marital status: Significant Other    Spouse name: JUDY   Number of children: Not on file   Years of education: Not on file   Highest education level: Not on file  Occupational History   Not on file  Tobacco Use   Smoking status: Never   Smokeless tobacco: Never  Vaping Use   Vaping Use: Never used  Substance and Sexual Activity   Alcohol use: No   Drug use: No   Sexual activity: Yes  Other Topics Concern   Not on file  Social History Narrative   Not on file   Social Determinants of Health   Financial Resource Strain: Not on file  Food Insecurity: Not on file  Transportation Needs: Not on file  Physical Activity: Not on file  Stress: Not on file  Social Connections: Not on file  Intimate Partner Violence: Not on file     Current Outpatient Medications:    aspirin 81 MG chewable tablet, Chew 81 mg by mouth daily., Disp: , Rfl:    atorvastatin (LIPITOR) 40 MG tablet, TAKE 1 TABLET BY MOUTH ONCE DAILY, Disp: 90 tablet, Rfl: 2   ENTRESTO 24-26 MG, TAKE 1 TABLET BY MOUTH TWICE DAILY, Disp: 180 tablet, Rfl: 3   famotidine (PEPCID) 20 MG tablet, One an hour before bedtime, Disp: 30 tablet, Rfl: 11   JARDIANCE 10 MG TABS tablet, TAKE 1 TABLET BY MOUTH ONCE EVERY MORNING WITH BREAKFAST, Disp: 30 tablet, Rfl: 0   metoprolol tartrate (LOPRESSOR) 25 MG tablet, Take 1 tablet (25 mg total) by mouth 2 (two) times daily., Disp: 60 tablet, Rfl: 6   omeprazole (PRILOSEC) 20 MG capsule, Take 2  x 30-60   min before first meal, Disp: , Rfl:    spironolactone (ALDACTONE) 25 MG tablet, Take 0.5 tablets (12.5 mg total) by mouth daily., Disp: 45 tablet, Rfl: 3   tamsulosin (FLOMAX) 0.4 MG CAPS capsule, Take 1 capsule (0.4 mg total) by mouth daily., Disp: 90 capsule, Rfl: 3  Allergies  Allergen Reactions   Amoxicillin Rash     Review of Systems   Constitutional:  Negative for chills, diaphoresis and fever.  Eyes:  Negative for blurred vision and double vision.  Respiratory:  Negative for cough, shortness of breath and wheezing.   Cardiovascular:  Negative for chest pain, palpitations and leg swelling.  Gastrointestinal:  Negative for blood in stool, constipation, diarrhea, heartburn (controlled with Pepcid and Prilosec), nausea and vomiting.  Musculoskeletal:  Negative for falls.  Neurological:  Negative for dizziness, tingling, tremors, loss of consciousness and headaches.  Psychiatric/Behavioral:  Negative for depression, memory loss, substance abuse and suicidal ideas. The patient is nervous/anxious and has insomnia.        Objective  Vitals:   01/06/22 0817 01/06/22 0824  BP: (!) 93/54 (!) 95/56  Pulse: (!) 58 (!) 59  Temp: 98.8 F (37.1 C)   TempSrc: Oral   SpO2: 94%   Weight: 226 lb 12.8 oz (102.9 kg)   Height: 6' 1.4" (1.864 m)     Body mass index is 29.6 kg/m.  Physical Exam Vitals reviewed.  Constitutional:      General: He is awake.     Appearance: Normal appearance. He is well-developed, well-groomed and overweight.  HENT:     Head: Normocephalic and atraumatic.     Right Ear: Hearing, tympanic membrane, ear canal and external ear normal. No decreased hearing noted.     Left Ear: Hearing, tympanic membrane, ear canal and external ear normal. No decreased hearing noted.     Nose: Nose normal.     Mouth/Throat:     Lips: Pink.     Mouth: Mucous membranes are moist. No oral lesions.  Pharynx: Oropharynx is clear. Uvula midline. No oropharyngeal exudate, posterior oropharyngeal erythema or uvula swelling.  Eyes:     General: Lids are normal. Gaze aligned appropriately. No allergic shiner.    Extraocular Movements: Extraocular movements intact.     Right eye: Normal extraocular motion and no nystagmus.     Left eye: Normal extraocular motion and no nystagmus.     Conjunctiva/sclera: Conjunctivae  normal.     Pupils: Pupils are equal, round, and reactive to light.  Neck:     Thyroid: No thyroid mass, thyromegaly or thyroid tenderness.  Cardiovascular:     Rate and Rhythm: Normal rate and regular rhythm.     Pulses: Normal pulses.          Radial pulses are 2+ on the right side and 2+ on the left side.     Heart sounds: Normal heart sounds. No murmur heard.    No friction rub. No gallop.  Pulmonary:     Effort: Pulmonary effort is normal.     Breath sounds: Normal breath sounds. No decreased air movement. No decreased breath sounds, wheezing, rhonchi or rales.  Abdominal:     General: Abdomen is flat. Bowel sounds are normal.     Palpations: Abdomen is soft.  Musculoskeletal:     Cervical back: Normal range of motion.     Right lower leg: No edema.     Left lower leg: No edema.  Lymphadenopathy:     Head:     Right side of head: No submental, submandibular or preauricular adenopathy.     Left side of head: No submental, submandibular or preauricular adenopathy.     Upper Body:     Right upper body: No supraclavicular adenopathy.     Left upper body: No supraclavicular adenopathy.  Neurological:     Mental Status: He is alert and oriented to person, place, and time.     GCS: GCS eye subscore is 4. GCS verbal subscore is 5. GCS motor subscore is 6.     Cranial Nerves: No dysarthria or facial asymmetry.     Motor: No weakness or tremor.     Gait: Gait is intact.  Psychiatric:        Attention and Perception: Attention and perception normal.        Mood and Affect: Mood and affect normal.        Speech: Speech normal.        Behavior: Behavior normal. Behavior is cooperative.        Thought Content: Thought content normal.        Cognition and Memory: Cognition and memory normal.        Judgment: Judgment normal.         Recent Results (from the past 2160 hour(s))  Urinalysis, Complete     Status: Abnormal   Collection Time: 10/26/21  8:40 AM  Result Value Ref  Range   Specific Gravity, UA 1.015 1.005 - 1.030   pH, UA 6.0 5.0 - 7.5   Color, UA Yellow Yellow   Appearance Ur Clear Clear   Leukocytes,UA Negative Negative   Protein,UA Negative Negative/Trace   Glucose, UA 3+ (A) Negative   Ketones, UA Negative Negative   RBC, UA Negative Negative   Bilirubin, UA Negative Negative   Urobilinogen, Ur 1.0 0.2 - 1.0 mg/dL   Nitrite, UA Negative Negative   Microscopic Examination See below:   Microscopic Examination     Status: None   Collection Time: 10/26/21  8:40 AM   Urine  Result Value Ref Range   WBC, UA 0-5 0 - 5 /hpf   RBC, Urine 0-2 0 - 2 /hpf   Epithelial Cells (non renal) None seen 0 - 10 /hpf   Bacteria, UA None seen None seen/Few  Urinalysis, Complete     Status: Abnormal   Collection Time: 11/23/21  9:21 AM  Result Value Ref Range   Specific Gravity, UA 1.020 1.005 - 1.030   pH, UA 5.0 5.0 - 7.5   Color, UA Yellow Yellow   Appearance Ur Clear Clear   Leukocytes,UA Negative Negative   Protein,UA Negative Negative/Trace   Glucose, UA 3+ (A) Negative   Ketones, UA Negative Negative   RBC, UA Negative Negative   Bilirubin, UA Negative Negative   Urobilinogen, Ur 0.2 0.2 - 1.0 mg/dL   Nitrite, UA Negative Negative   Microscopic Examination See below:   Microscopic Examination     Status: None   Collection Time: 11/23/21  9:21 AM   Urine  Result Value Ref Range   WBC, UA 0-5 0 - 5 /hpf   RBC, Urine 0-2 0 - 2 /hpf   Epithelial Cells (non renal) 0-10 0 - 10 /hpf   Bacteria, UA None seen None seen/Few  CULTURE, URINE COMPREHENSIVE     Status: Abnormal   Collection Time: 11/23/21  4:14 PM   Specimen: Urine   UR  Result Value Ref Range   Urine Culture, Comprehensive Final report (A)    Organism ID, Bacteria Comment (A)     Comment: Beta hemolytic Streptococcus, group B 6,000  Colonies/mL Penicillin and ampicillin are drugs of choice for treatment of beta-hemolytic streptococcal infections. Susceptibility testing  of penicillins and other beta-lactam agents approved by the FDA for treatment of beta-hemolytic streptococcal infections need not be performed routinely because nonsusceptible isolates are extremely rare in any beta-hemolytic streptococcus and have not been reported for Streptococcus pyogenes (group A). (CLSI)   CULTURE, URINE COMPREHENSIVE     Status: Abnormal   Collection Time: 12/30/21  8:15 AM   Specimen: Urine   UR  Result Value Ref Range   Urine Culture, Comprehensive Final report (A)    Organism ID, Bacteria Comment (A)     Comment: Staphylococcus saprophyticus The CLSI does not advise routine susceptibility testing of urinary tract isolates of Staphylococcus saprophyticus, because acute, uncomplicated urinary tract infections caused by this organism respond to concentrations achieved in urine of antimicrobial agents commonly used to treat these infections, such as nitrofurantoin, a fluoroquinolone, or trimethoprim with or without sulfamethoxazole. CLSI, M100-S15, 2005. 1,000 Colonies/mL   Urinalysis, Complete     Status: Abnormal   Collection Time: 12/30/21  8:15 AM  Result Value Ref Range   Specific Gravity, UA 1.020 1.005 - 1.030   pH, UA 5.0 5.0 - 7.5   Color, UA Yellow Yellow   Appearance Ur Clear Clear   Leukocytes,UA Negative Negative   Protein,UA Negative Negative/Trace   Glucose, UA 3+ (A) Negative   Ketones, UA Negative Negative   RBC, UA Negative Negative   Bilirubin, UA Negative Negative   Urobilinogen, Ur 0.2 0.2 - 1.0 mg/dL   Nitrite, UA Negative Negative   Microscopic Examination See below:   Microscopic Examination     Status: None   Collection Time: 12/30/21  8:15 AM   Urine  Result Value Ref Range   WBC, UA 0-5 0 - 5 /hpf   RBC, Urine 0-2 0 - 2 /hpf   Epithelial  Cells (non renal) 0-10 0 - 10 /hpf   Bacteria, UA Few None seen/Few  CBC     Status: None   Collection Time: 01/05/22  3:50 PM  Result Value Ref Range   WBC 7.0 4.0 - 10.5 K/uL    RBC 5.39 4.22 - 5.81 MIL/uL   Hemoglobin 15.6 13.0 - 17.0 g/dL   HCT 47.7 39.0 - 52.0 %   MCV 88.5 80.0 - 100.0 fL   MCH 28.9 26.0 - 34.0 pg   MCHC 32.7 30.0 - 36.0 g/dL   RDW 13.2 11.5 - 15.5 %   Platelets 254 150 - 400 K/uL   nRBC 0.0 0.0 - 0.2 %    Comment: Performed at [facility], [address], [place], [place] [address]  Basic metabolic panel     Status: Abnormal   Collection Time: 01/05/22  3:50 PM  Result Value Ref Range   Sodium 140 135 - 145 mmol/L   Potassium 4.0 3.5 - 5.1 mmol/L   Chloride 107 98 - 111 mmol/L   CO2 28 22 - 32 mmol/L   Glucose, Bld 101 (H) 70 - 99 mg/dL    Comment: Glucose reference range applies only to samples taken after fasting for at least 8 hours.   BUN 20 8 - 23 mg/dL   Creatinine, Ser 0.97 0.61 - 1.24 mg/dL   Calcium 8.9 8.9 - 10.3 mg/dL   GFR, Estimated >60 >60 mL/min    Comment: (NOTE) Calculated using the CKD-EPI Creatinine Equation (2021)    Anion gap 5 5 - 15    Comment: Performed at [facility], [address] [place], [place] [address]     Fall Risk:    01/06/2022    8:26 AM 12/07/2021    8:14 AM 09/28/2021    4:09 PM 10/15/2020    8:16 AM 09/14/2020    8:14 AM  Fall Risk   Falls in the past year? 0 1 0 0 0  Number falls in past yr: 0 0 0 0 0  Injury with Fall? 0 1 0 0 0  Risk for fall due to : No Fall Risks  No Fall Risks No Fall Risks No Fall Risks  Follow up _0      Functional Status Survey:      Assessment & Plan  Problem List Items Addressed This Visit       Other   Mixed hyperlipidemia   Relevant Orders   Lipid Profile   Other Visit Diagnoses     Encounter for annual physical exam    -  Primary -Prostate cancer screening and PSA options (with potential risks and benefits of testing vs not testing) were discussed along with recent recs/guidelines. -USPSTF  grade A and B recommendations reviewed with patient; age-appropriate recommendations, preventive care, screening tests, etc discussed and encouraged; healthy living encouraged; see AVS for patient education given to patient -Discussed importance of 150 minutes of physical activity weekly, eat two servings of fish weekly, eat one serving of tree nuts ( cashews, pistachios, pecans, almonds.[name]) every other day, eat 6 servings of fruit/vegetables daily and drink plenty of water and avoid sweet beverages.  -Reviewed Health Maintenance: yes    Relevant Orders   Comp Met (CMET)   CBC w/Diff   Lipid Profile   Need for tetanus, diphtheria, and acellular pertussis (Tdap) vaccine       Relevant Orders  Tdap vaccine greater than or equal to 7yo IM (Completed)   Screening for diabetes mellitus       Relevant Orders   HgB A1c        Return in about 1 year (around 01/07/2023) for annual physical .   I, Puja Caffey E Ksenia Kunz, PA-C, have reviewed all documentation for this visit. The documentation on 01/06/22 for the exam, diagnosis, procedures, and orders are all accurate and complete.   Talitha Givens, MHS, PA-C [facility]

## 2022-01-06 NOTE — Progress Notes (Signed)
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 Clinical nursing staff has updated patient's PMH/PSHx, current medication list, and drug allergies/intolerances to ensure comprehensive history available to assist in medical decision making as it pertains to the aforementioned surgical procedure and anticipated anesthetic course. Extensive review of available clinical information performed. [place] PMH and PSHx updated with any diagnoses/procedures that  may have been inadvertently omitted during his intake with the pre-admission testing department's nursing staff.  Clinical Discussion:  [name] is a 61 y.o. male who is submitted for pre-surgical anesthesia review and clearance prior to him undergoing the above procedure. Patient has never been a smoker. Pertinent PMH includes: CAD, non-ischemic cardiomyopathy, HFmrEF, PSVT, NSVT (s/p ILR placement), diastolic dysfunction, syncope, ascending aorta dilatation, aortic atherosclerosis, angina, HTN, HLD, GERD (on daily PPI), BPH, fatigue.  Patient is followed by cardiology [name], MD). He was last seen in the cardiology clinic on 10/15/2021; notes reviewed.  At the time of his clinic visit, patient was complaining of episodes of chest pain, vertiginous symptoms, and tachycardia.  Patient with a heart rate monitor feature on his watch and heart rate was noted to be in the 190s.  He denied any associated shortness of  breath.  Patient has not experienced any PND, orthopnea, significant peripheral edema, or presyncope/syncope.  Patient with past medical history significant for cardiovascular diagnoses.  Diagnostic LEFT heart catheterization performed on 10/17/2013 revealed a normal left ventricular systolic function with an EF of 65%.  There was mild single-vessel nonobstructive CAD noted; 20% mid LAD.  Given the nonobstructive nature of his coronaries, the decision was made to defer intervention opting for medical management.  Patient underwent placement of a Medtronic Linq (ILR) device on 12/09/2013.  ILR documented episode of NSVT in 05/2014.  Patient has undergone further testing since this time.  Long-term cardiac event monitor study performed in 10/2021 revealed 2 episodes of NSVT with the longest lasting 6 beats at a rate of 197 bpm.  Additionally, event monitor study documented 148 episodes of PSVT with a maximum heart rate of 226 bpm.  Most recent TTE was performed on 08/06/2021 revealing a mildly reduced left ventricular systolic function with an EF of 45-50%.  There were no regional wall motion abnormalities. Left ventricular diastolic Doppler parameters consistent with pseudonormalization (G2DD). GLS -16.5%.  There was mild mitral valve regurgitation.  There was no evidence of a significant transvalvular gradient to suggest stenosis.  Aortic root found to be mildly dilated at 37 mm.  Patient with a familial history of clotting disorder.  He has been seen by hematology and underwent a hypercoagulable work-up in 09/28/2021.  Work-up was negative; prothrombin gene mutation, factor V Leiden, protein C, protein S, and Antithrombin III all normal/negative.  Cardiomyopathy and resulting HFmrEF well managed on currently prescribed ARB/ANRi Delene Loll), beta-blocker (metoprolol tartrate), and diuretic (prior lactone) therapies.  Patient is on atorvastatin for his HLD diagnosis and further ASCVD prevention.  For added  cardiovascular and renal protection, patient is on  an SGLT2i (empagliflozin) therapy.  He is not diabetic. Patient does not have an OSAH diagnosis. Functional capacity, as defined by DASI, is documented as being >/= 4 METS.  Given continued episodes of tachycardia and documented PSVT/NSVT episodes, beta-blocker dose was increased.  If no improvement, discussed alternative therapies including CCB and antiarrhythmic medications.  Patient verbalized understanding.  No other changes were made to his medication regimen.  Patient to follow-up with outpatient cardiology in 2 months or sooner if needed.  [name] is scheduled for an TRANSURETHRAL RESECTION OF THE PROSTATE (TURP) on 01/10/2022 as with Dr. Erlene Quan, MD.  Given patient's past medical history significant for cardiovascular diagnoses, presurgical cardiac clearance was sought by the PAT team. Per cardiology, "able to achieve > 4 METS of activity. DOE and tachypalpitations have improved. Should be at an ACCEPTABLE risk for surgery with stable cardiovascular symptoms". In review of his medication reconciliation, it is noted the patient is on daily antiplatelet therapy.  He has been instructed on recommendations from his cardiologist for holding his daily low-dose ASA for 5 days prior to his procedure with plans to restart as soon as postoperative bleeding risk felt to be minimized by his primary attending surgeon.  Patient is aware that his last dose of ASA should be on 01/04/2022.  Patient denies previous perioperative complications with anesthesia in the past. In review of the available records, it is noted that patient underwent a general anesthetic course here at [facility] (ASA III) in 11/2020 without documented complications.      01/06/2022    8:24 AM 01/06/2022    8:17 AM 12/07/2021    8:12 AM  Vitals with BMI  Height  6' 1.4" 6' 3"   Weight  226 lbs 13 oz 225 lbs  BMI  56.38 75.64  Systolic 95 93 332   Diastolic 56 54 75  Pulse 59 58 57    Providers/Specialists:   NOTE: Primary physician provider listed below. Patient may have been seen by APP or partner within same practice.   PROVIDER ROLE / SPECIALTY LAST Lu Duffel, MD Urology (Surgeon) 11/23/2021  [name], MD (Inactive) Primary Care Provider 01/06/2022  Virl Axe, MD Cardiology/Electrophysiology 01/07/2022  Randa Evens, MD Hematology 10/25/2021   Allergies:  Amoxicillin  Current Home Medications:   No current facility-administered medications for this encounter.    aspirin 81 MG chewable tablet   atorvastatin (LIPITOR) 40 MG tablet   ENTRESTO 24-26 MG   famotidine (PEPCID) 20 MG tablet   JARDIANCE 10 MG TABS tablet   metoprolol tartrate (LOPRESSOR) 25 MG tablet   omeprazole (PRILOSEC) 20 MG capsule   spironolactone (ALDACTONE) 25 MG tablet   tamsulosin (FLOMAX) 0.4 MG CAPS capsule   History:   Past Medical History:  Diagnosis Date   Anginal pain (HCC)    Aortic atherosclerosis (HCC)    Ascending aorta dilatation ([place])    a.) TTE 09/29/2020: Ao root 37 mm, asc Ao 39 mm, Ao arch 33 mm; b.) TTE 08/06/2021: Ao root 37 mm   CAD (coronary artery disease) 10/17/2013   a.) LHC 10/17/2013: EF 65%. 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) ([place])  a.) TTE 09/29/2020: EF 45-50%, sev bas-mid inferior HK, GLS -15.5%, mild MR, G1DD; b.) TTE 08/06/2021: EF 45-50%, no RWMAs, GLS -16.5%, mild MR, G2DD   History of 2019 novel coronavirus disease (COVID-19) 01/2020   Hypertension    Implantable loop recorder 12/09/2013   a.) Medtronic ILR device   NICM (nonischemic cardiomyopathy) ([place])    a.) TTE  10/08/2014: EF 50-55%; b.) TTE 09/29/2020: EF 45-50%; c.) TTE 08/06/2021: EF 45-50%   Nonsustained ventricular tachycardia ([place]) 05/2014   a.) noted on ILR in 05/2014; b.) holter 10/2021: NSVT x 2 episodes; longest lasted 6 beats at a rate of 197 bpm   Pneumonia due to COVID-19 virus 01/2020   a.) hospitalized and Tx'd with MAB infusions   Prostatism    PSVT (paroxysmal supraventricular tachycardia) ([place])    a.) holter 10/2021: 148 SVT episodes with max HR 226 bpm   Sinus tachycardia    Syncope 10/16/2013   Past Surgical History:  Procedure Laterality Date   COLONOSCOPY WITH PROPOFOL N/A 11/24/2020   Procedure: COLONOSCOPY WITH PROPOFOL;  Surgeon: Lucilla Lame, MD;  Location: [facility] ENDOSCOPY;  Service: Endoscopy;  Laterality: N/A;   LEFT HEART CATHETERIZATION WITH CORONARY ANGIOGRAM N/A 10/17/2013   Procedure: LEFT HEART CATHETERIZATION WITH CORONARY ANGIOGRAM;  Surgeon: Burnell Blanks, MD;  Location: [facility] CATH LAB;  Service: Cardiovascular;  Laterality: N/A;   LOOP RECORDER IMPLANT N/A 12/09/2013   Procedure: LOOP RECORDER IMPLANT;  Surgeon: Deboraha Sprang, MD;  Location: [facility] CATH LAB;  Service: Cardiovascular;  Laterality: N/A;   Family History  Problem Relation Age of Onset   Hypertension Mother    Arrhythmia Mother    Heart disease Mother    Parkinson's disease Mother    Alzheimer's disease Mother    Deep vein thrombosis Father    Colon cancer Father    Sudden death Maternal Grandfather 42   ADD / ADHD Paternal Aunt    Deep vein thrombosis Paternal Aunt    Deep vein thrombosis Paternal Uncle    Social History   Tobacco Use   Smoking status: Never   Smokeless tobacco: Never  Vaping Use   Vaping Use: Never used  Substance Use Topics   Alcohol use: No   Drug use: No    Pertinent Clinical Results:  LABS: Labs reviewed: Acceptable for surgery.  Office Visit on 01/06/2022  Component Date Value Ref Range Status   Glucose 01/06/2022 88  70 - 99 mg/dL Final   BUN  01/06/2022 16  8 - 27 mg/dL Final   Creatinine, Ser 01/06/2022 0.82  0.76 - 1.27 mg/dL Final   eGFR 01/06/2022 99  >59 mL/min/1.73 Final   BUN/Creatinine Ratio 01/06/2022 20  10 - 24 Final   Sodium 01/06/2022 140  134 - 144 mmol/L Final   Potassium 01/06/2022 4.4  3.5 - 5.2 mmol/L Final   Chloride 01/06/2022 105  96 - 106 mmol/L Final   CO2 01/06/2022 23  20 - 29 mmol/L Final   Calcium 01/06/2022 8.9  8.6 - 10.2 mg/dL Final   Total Protein 01/06/2022 6.4  6.0 - 8.5 g/dL Final   Albumin 01/06/2022 3.9  3.9 - 4.9 g/dL Final   Globulin, Total 01/06/2022 2.5  1.5 - 4.5 g/dL Final   Albumin/Globulin Ratio 01/06/2022 1.6  1.2 - 2.2 Final   Bilirubin Total 01/06/2022 0.5  0.0 - 1.2 mg/dL Final   Alkaline Phosphatase 01/06/2022 104  44 - 121 IU/L Final   AST 01/06/2022 14  0 - 40  IU/L Final   ALT 01/06/2022 10  0 - 44 IU/L Final   WBC 01/06/2022 6.2  3.4 - 10.8 x10E3/uL Final   RBC 01/06/2022 5.34  4.14 - 5.80 x10E6/uL Final   Hemoglobin 01/06/2022 16.4  13.0 - 17.7 g/dL Final   Hematocrit 01/06/2022 48.1  37.5 - 51.0 % Final   MCV 01/06/2022 90  79 - 97 fL Final   MCH 01/06/2022 30.7  26.6 - 33.0 pg Final   MCHC 01/06/2022 34.1  31.5 - 35.7 g/dL Final   RDW 01/06/2022 13.2  11.6 - 15.4 % Final   Platelets 01/06/2022 252  150 - 450 x10E3/uL Final   Neutrophils 01/06/2022 62  Not Estab. % Final   Lymphs 01/06/2022 22  Not Estab. % Final   Monocytes 01/06/2022 12  Not Estab. % Final   Eos 01/06/2022 3  Not Estab. % Final   Basos 01/06/2022 1  Not Estab. % Final   Neutrophils Absolute 01/06/2022 3.9  1.4 - 7.0 x10E3/uL Final   Lymphocytes Absolute 01/06/2022 1.3  0.7 - 3.1 x10E3/uL Final   Monocytes Absolute 01/06/2022 0.7  0.1 - 0.9 x10E3/uL Final   EOS (ABSOLUTE) 01/06/2022 0.2  0.0 - 0.4 x10E3/uL Final   Basophils Absolute 01/06/2022 0.0  0.0 - 0.2 x10E3/uL Final   Immature Granulocytes 01/06/2022 0  Not Estab. % Final   Immature Grans (Abs) 01/06/2022 0.0  0.0 - 0.1 x10E3/uL Final    Cholesterol, Total 01/06/2022 148  100 - 199 mg/dL Final   Triglycerides 01/06/2022 73  0 - 149 mg/dL Final   HDL 01/06/2022 44  >39 mg/dL Final   VLDL Cholesterol Cal 01/06/2022 14  5 - 40 mg/dL Final   LDL Chol Calc (NIH) 01/06/2022 90  0 - 99 mg/dL Final   Chol/HDL Ratio 01/06/2022 3.4  0.0 - 5.0 ratio Final   Comment:                                   T. Chol/HDL Ratio                                             Men  Women                               1/2 Avg.Risk  3.4    3.3                                   Avg.Risk  5.0    4.4                                2X Avg.Risk  9.6    7.1                                3X Avg.Risk 23.4   11.0    Hgb A1c MFr Bld 01/06/2022 5.6  4.8 - 5.6 % Final   Comment:          Prediabetes: 5.7 - 6.4  Diabetes: >6.4          Glycemic control for adults with diabetes: <7.0    Est. average glucose Bld gHb Est-m* 01/06/2022 114  mg/dL Final  Hospital Outpatient Visit on 01/05/2022  Component Date Value Ref Range Status   WBC 01/05/2022 7.0  4.0 - 10.5 K/uL Final   RBC 01/05/2022 5.39  4.22 - 5.81 MIL/uL Final   Hemoglobin 01/05/2022 15.6  13.0 - 17.0 g/dL Final   HCT 01/05/2022 47.7  39.0 - 52.0 % Final   MCV 01/05/2022 88.5  80.0 - 100.0 fL Final   MCH 01/05/2022 28.9  26.0 - 34.0 pg Final   MCHC 01/05/2022 32.7  30.0 - 36.0 g/dL Final   RDW 01/05/2022 13.2  11.5 - 15.5 % Final   Platelets 01/05/2022 254  150 - 400 K/uL Final   nRBC 01/05/2022 0.0  0.0 - 0.2 % Final   Performed at [facility], [address] [place], [place] [address]   Sodium 01/05/2022 140  135 - 145 mmol/L Final   Potassium 01/05/2022 4.0  3.5 - 5.1 mmol/L Final   Chloride 01/05/2022 107  98 - 111 mmol/L Final   CO2 01/05/2022 28  22 - 32 mmol/L Final   Glucose, Bld 01/05/2022 101 (H)  70 - 99 mg/dL Final   Glucose reference range applies only to samples taken after fasting for at least 8 hours.   BUN 01/05/2022 20  8 - 23 mg/dL Final   Creatinine, Ser  01/05/2022 0.97  0.61 - 1.24 mg/dL Final   Calcium 01/05/2022 8.9  8.9 - 10.3 mg/dL Final   GFR, Estimated 01/05/2022 >60  >60 mL/min Final   Comment: (NOTE) Calculated using the CKD-EPI Creatinine Equation (2021)    Anion gap 01/05/2022 5  5 - 15 Final   Performed at [facility], [place], [place] [address]    ECG: Date: 10/15/2021 Time ECG obtained: 0949 AM Rate: 61 bpm Rhythm:  NSR; LAFB Axis (leads I and aVF): Normal Intervals: PR 138 ms. QRS 112 ms. QTc 392 ms. ST segment and T wave changes: No evidence of acute ST segment elevation or depression Comparison: Similar to previous tracing obtained on 09/09/2021   IMAGING / PROCEDURES: LONG TERM CARDIAC EVENT MONITOR STUDY performed on 10/25/2021 Patch Wear Time:  14 days and 0 hours   Patient had a min HR of 50 bpm, max HR of 226 bpm, and avg HR of 76 bpm. Predominant underlying rhythm was Sinus Rhythm.  2 Ventricular Tachycardia runs occurred, the run with the fastest interval lasting 6 beats with a max rate of 197 bpm (avg 176 bpm); the run with the fastest interval was also the longest.  148 Supraventricular Tachycardia runs occurred, the run with the fastest interval lasting 8.0 secs with a max rate of 226 bpm, the longest lasting 55.8 secs with an avg rate of 153 bpm. Isolated SVEs were rare (<1.0%), SVE Couplets were rare (<1.0%), and SVE Triplets were rare (<1.0%). Isolated VEs were occasional (1.0%, 15761), VE Couplets were rare (<1.0%, 349), and VE Triplets were rare (<1.0%, 6). Ventricular Bigeminy and Trigeminy were present.  Triggered events associated with sinus rhythm  Continue current meds   TRANSTHORACIC ECHOCARDIOGRAM performed on 08/06/2021 Left ventricular ejection fraction, by estimation, is 45 to 50%. The left ventricle has mildly decreased function. The left ventricle has no regional wall motion abnormalities. Left ventricular diastolic parameters are consistent with Grade II diastolic  dysfunction (pseudonormalization). The  average left ventricular global longitudinal strain is -16.5 %.  Right ventricular systolic function is normal. The right ventricular size is normal. Tricuspid regurgitation signal is inadequate for assessing PA pressure.  The mitral valve is normal in structure. Mild mitral valve regurgitation. No evidence of mitral stenosis.  The aortic valve is tricuspid. Aortic valve regurgitation is not visualized. No aortic stenosis is present.  There is borderline dilatation of the aortic root and of the ascending aorta, measuring 37 mm.  The inferior vena cava is normal in size with greater than 50% respiratory variability, suggesting right atrial pressure of 3 mmHg.   CT CORONARY MORPH W/CTA COR W/SCORE W/CA W/CM &/OR WO/CM performed on 11/16/2020 Coronary calcium score of 108. This was 66th percentile for age and sex matched control. Normal coronary origin with right dominance. Mild proximal LAD disease (25%), minimal distal RCA disease (<25%). CAD-RADS 2. Mild non-obstructive CAD (25-49%). Consider non-atherosclerotic causes of chest pain. Consider preventive therapy and risk factor modification.  MYOCARDIAL PERFUSION IMAGING STUDY (LEXISCAN) performed on 10/07/2020 Normal left ventricular systolic function with a low normal LVEF of 50% Normal myocardial thickening and wall motion Left ventricular cavity size normal SPECT images demonstrate homogenous tracer distribution throughout the myocardium No evidence of stress-induced myocardial ischemia or arrhythmia Normal low risk study  Impression and Plan:  BAILY SERPE has been referred for pre-anesthesia review and clearance prior to him undergoing the planned anesthetic and procedural courses. Available labs, pertinent testing, and imaging results were personally reviewed by me. This patient has been appropriately cleared by cardiology with an overall ACCEPTABLE risk of significant perioperative cardiovascular  complications.  Based on clinical review performed today (01/07/22), barring any significant acute changes in the patient's overall condition, it is anticipated that he will be able to proceed with the planned surgical intervention. Any acute changes in clinical condition may necessitate his procedure being postponed and/or cancelled. Patient will meet with anesthesia team (MD and/or CRNA) on the day of his procedure for preoperative evaluation/assessment. Questions regarding anesthetic course will be fielded at that time.   Pre-surgical instructions were reviewed with the patient during his PAT appointment and questions were fielded by PAT clinical staff. Patient was advised that if any questions or concerns arise prior to his procedure then he should return a call to PAT and/or his surgeon's office to discuss.  Honor Loh, MSN, APRN, FNP-C, CEN [facility]  Peri-operative Services Nurse Practitioner Phone: [phone] Fax: [phone] 01/07/22 4:20 PM  NOTE: This note has been prepared using Dragon dictation software. Despite my best ability to proofread, there is always the potential that unintentional transcriptional errors may still occur from this process.

## 2022-01-06 NOTE — Telephone Encounter (Signed)
Attempted phone call to pt.  Left voicemail message explaining CMA has attempted to contact pt multiple times and has sent a MyChart message regarding appointment.  Advised typically we contact pt 15 minutes prior to appointment to update information and then Dr [name] would connect by video visit.

## 2022-01-07 ENCOUNTER — Telehealth: Payer: Self-pay

## 2022-01-07 ENCOUNTER — Telehealth: Payer: BC Managed Care – PPO | Admitting: Internal Medicine

## 2022-01-07 ENCOUNTER — Encounter: Payer: Self-pay | Admitting: Urology

## 2022-01-07 LAB — CBC WITH DIFFERENTIAL/PLATELET
Basophils Absolute: 0 10*3/uL (ref 0.0–0.2)
Basos: 1 %
EOS (ABSOLUTE): 0.2 10*3/uL (ref 0.0–0.4)
Eos: 3 %
Hematocrit: 48.1 % (ref 37.5–51.0)
Hemoglobin: 16.4 g/dL (ref 13.0–17.7)
Immature Grans (Abs): 0 10*3/uL (ref 0.0–0.1)
Immature Granulocytes: 0 %
Lymphocytes Absolute: 1.3 10*3/uL (ref 0.7–3.1)
Lymphs: 22 %
MCH: 30.7 pg (ref 26.6–33.0)
MCHC: 34.1 g/dL (ref 31.5–35.7)
MCV: 90 fL (ref 79–97)
Monocytes Absolute: 0.7 10*3/uL (ref 0.1–0.9)
Monocytes: 12 %
Neutrophils Absolute: 3.9 10*3/uL (ref 1.4–7.0)
Neutrophils: 62 %
Platelets: 252 10*3/uL (ref 150–450)
RBC: 5.34 x10E6/uL (ref 4.14–5.80)
RDW: 13.2 % (ref 11.6–15.4)
WBC: 6.2 10*3/uL (ref 3.4–10.8)

## 2022-01-07 LAB — COMPREHENSIVE METABOLIC PANEL
ALT: 10 IU/L (ref 0–44)
AST: 14 IU/L (ref 0–40)
Albumin/Globulin Ratio: 1.6 (ref 1.2–2.2)
Albumin: 3.9 g/dL (ref 3.9–4.9)
Alkaline Phosphatase: 104 IU/L (ref 44–121)
BUN/Creatinine Ratio: 20 (ref 10–24)
BUN: 16 mg/dL (ref 8–27)
Bilirubin Total: 0.5 mg/dL (ref 0.0–1.2)
CO2: 23 mmol/L (ref 20–29)
Calcium: 8.9 mg/dL (ref 8.6–10.2)
Chloride: 105 mmol/L (ref 96–106)
Creatinine, Ser: 0.82 mg/dL (ref 0.76–1.27)
Globulin, Total: 2.5 g/dL (ref 1.5–4.5)
Glucose: 88 mg/dL (ref 70–99)
Potassium: 4.4 mmol/L (ref 3.5–5.2)
Sodium: 140 mmol/L (ref 134–144)
Total Protein: 6.4 g/dL (ref 6.0–8.5)
eGFR: 99 mL/min/{1.73_m2} (ref >59)

## 2022-01-07 LAB — LIPID PANEL
Chol/HDL Ratio: 3.4 ratio (ref 0.0–5.0)
Cholesterol, Total: 148 mg/dL (ref 100–199)
HDL: 44 mg/dL (ref >39)
LDL Chol Calc (NIH): 90 mg/dL (ref 0–99)
Triglycerides: 73 mg/dL (ref 0–149)
VLDL Cholesterol Cal: 14 mg/dL (ref 5–40)

## 2022-01-07 LAB — HEMOGLOBIN A1C
Est. average glucose Bld gHb Est-mCnc: 114 mg/dL
Hgb A1c MFr Bld: 5.6 % (ref 4.8–5.6)

## 2022-01-07 NOTE — Telephone Encounter (Signed)
Called the patient and left a message that I was calling the patient to get him ready for his phone visit. I left a message for him to call the office back.

## 2022-01-07 NOTE — Progress Notes (Unsigned)
Electrophysiology TeleHealth Note      Date:  01/07/2022   ID:  [name], DOB Sep 08, 1960, MRN [id number]  Location: patient's home  Provider location: [address] [place]  Evaluation Performed: Follow-up visit  PCP:  Loura Pardon, MD (Inactive)  Cardiologist:   *** Electrophysiologist:  SK   Chief Complaint:  ***  History of Present Illness:    [name] is a 61 y.o. male who presents via audio/video conferencing for a telehealth visit today.  Since last being seen in our clinic for ischemic cardiomyopathy and syncope for which he underwent loop recorder implantation 2018.  Intercurrently has had COVID-pneumonia the patient reports need for TURP --urologist thought nodule preventing urination.   Dyspnea improved; some tachypalps.  Improved on metoprolol No chest pains   DATE TEST EF    7/15 Cath   Normal CAs  7/16 ECHO  NORMAL    7/16 ETT NO atrial tachycardia    6/22 Echo  45%     6/22 Myoview  50% No perfusion defects  8/22 CTA   CaScore 108 NonObstructive LAD/RCA  4/23 Echo  45-50%      Date Cr K Hgb  10/21 0.8 3.2 15.9  5/22  0.6 3.9 16.4  8/22   4.2    4/23 0.9 4.0          The patient denies symptoms of fevers, chills, cough, or new SOB worrisome for COVID 19.   Past Medical History:  Diagnosis Date   Anginal pain (HCC)    Aortic atherosclerosis (HCC)    Ascending aorta dilatation (HCC)    a.) TTE 09/29/2020: Ao root 37 mm, asc Ao 39 mm, Ao arch 33 mm; b.) TTE 08/06/2021: Ao root 37 mm   CAD (coronary artery disease) 10/17/2013   a.) LHC 10/17/2013: EF 65%. 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) (HCC)    a.) TTE 09/29/2020:  EF 45-50%, sev bas-mid inferior HK, GLS -15.5%, mild MR, G1DD; b.) TTE 08/06/2021: EF 45-50%, no RWMAs, GLS -16.5%, mild MR, G2DD   History of 2019 novel coronavirus disease (COVID-19) 01/2020   Hypertension    Implantable loop recorder 12/09/2013   a.) Medtronic ILR device   NICM (nonischemic cardiomyopathy) (HCC)    a.) TTE 10/08/2014: EF 50-55%; b.) TTE 09/29/2020: EF 45-50%; c.) TTE 08/06/2021: EF 45-50%   Nonsustained ventricular tachycardia (HCC) 05/2014   a.) noted on ILR in 05/2014; b.) holter 10/2021: NSVT x 2 episodes; longest lasted 6 beats at a rate of 197 bpm   Pneumonia due to COVID-19 virus 01/2020   a.) hospitalized and Tx'd with MAB infusions   Prostatism    PSVT (paroxysmal supraventricular tachycardia) (HCC)    a.) holter 10/2021: 148 SVT episodes with max HR 226 bpm   Sinus tachycardia    Syncope 10/16/2013    Past Surgical History:  Procedure Laterality Date   COLONOSCOPY WITH PROPOFOL N/A 11/24/2020   Procedure: COLONOSCOPY WITH PROPOFOL;  Surgeon: [name], MD;  Location: [facility] ENDOSCOPY;  Service: Endoscopy;  Laterality: N/A;   LEFT HEART CATHETERIZATION WITH CORONARY ANGIOGRAM N/A 10/17/2013   Procedure: LEFT HEART CATHETERIZATION WITH CORONARY  ANGIOGRAM;  Surgeon: Burnell Blanks, MD;  Location: [facility] CATH LAB;  Service: Cardiovascular;  Laterality: N/A;   LOOP RECORDER IMPLANT N/A 12/09/2013   Procedure: LOOP RECORDER IMPLANT;  Surgeon: Deboraha Sprang, MD;  Location: [facility] CATH LAB;  Service: Cardiovascular;  Laterality: N/A;    Current Outpatient Medications  Medication Sig Dispense Refill   aspirin 81 MG chewable tablet Chew 81 mg by mouth daily.     atorvastatin (LIPITOR) 40 MG tablet TAKE 1 TABLET BY MOUTH ONCE DAILY 90 tablet 2   ENTRESTO 24-26 MG TAKE 1 TABLET BY MOUTH TWICE DAILY 180 tablet 3   famotidine (PEPCID) 20 MG tablet One an hour before bedtime 30 tablet 11   JARDIANCE 10 MG TABS tablet TAKE 1 TABLET BY MOUTH ONCE EVERY MORNING WITH  BREAKFAST 30 tablet 0   metoprolol tartrate (LOPRESSOR) 25 MG tablet Take 1 tablet (25 mg total) by mouth 2 (two) times daily. 60 tablet 6   omeprazole (PRILOSEC) 20 MG capsule Take 2  x 30-60   min before first meal     spironolactone (ALDACTONE) 25 MG tablet Take 0.5 tablets (12.5 mg total) by mouth daily. 45 tablet 3   tamsulosin (FLOMAX) 0.4 MG CAPS capsule Take 1 capsule (0.4 mg total) by mouth daily. 90 capsule 3   No current facility-administered medications for this visit.    Allergies:   Amoxicillin   Social History:  The patient  reports that he has never smoked. He has never used smokeless tobacco. He reports that he does not drink alcohol and does not use drugs.   Family History:  The patient's   family history includes ADD / ADHD in his paternal aunt; Alzheimer's disease in his mother; Arrhythmia in his mother; Colon cancer in his father; Deep vein thrombosis in his father, paternal aunt, and paternal uncle; Heart disease in his mother; Hypertension in his mother; Parkinson's disease in his mother; Sudden death (age of onset: 8) in his maternal grandfather.   ROS:  Please see the history of present illness.   All other systems are personally reviewed and negative.    Exam:    Vital Signs:  There were no vitals taken for this visit.        Labs/Other Tests and Data Reviewed:    Recent Labs: 01/06/2022: ALT 10; BUN 16; Creatinine, Ser 0.82; Hemoglobin 16.4; Platelets 252; Potassium 4.4; Sodium 140   Wt Readings from Last 3 Encounters:  01/06/22 226 lb 12.8 oz (102.9 kg)  12/07/21 225 lb (102.1 kg)  11/23/21 222 lb (100.7 kg)     Other studies personally reviewed: Additional studies/ records that were reviewed today include     ASSESSMENT & PLAN:   Preop for TURP   Lightheadedness   Dyspnea on exertion improved  Tachypalpitations  better   COVID  Pneumonia in the past   HFmrEF 45-50    Hyperlipidemia   Should be at acceptable risk for surgery with  stable cardiovascular symptoms At level of >4 METS    Follow-up:  As Scheduled     Current medicines are reviewed at length with the patient today.   The patient  concerns regarding his medicines.  The following changes were made today:    Labs/ tests ordered today include:   No orders of the defined types were placed in this encounter.     Today, I have spent 12 minutes with the patient with telehealth technology discussing the above.  Signed, Virl Axe, MD  01/07/2022 3:29 PM     [facility] [address] [place] [address] [phone] (office) [phone] (fax)

## 2022-01-07 NOTE — Telephone Encounter (Signed)
Pt given lab results per notes of Erin Mecum, PA-C on 01/07/22. Pt verbalized understanding.      Dani Gobble Mecum, PA-C  01/07/2022  3:24 PM EDT     Labs are normal/stable.  A1c (Alc is a number that reflects the 3 month average of your blood glucose and is used to determine if someone has prediabetes or diabetes) is on the upper limit of normal. Please reduce sugar and carbohydrate intake, increase water consumption and incorporate exercise into your daily regimen as tolerated to help keep this from progressing to prediabetes.

## 2022-01-07 NOTE — Telephone Encounter (Signed)
I left the pt a message to call the office back for his appointment.

## 2022-01-07 NOTE — Patient Instructions (Signed)
Medication Instructions:  Your physician recommends that you continue on your current medications as directed. Please refer to the Current Medication list given to you today.  *If you need a refill on your cardiac medications before your next appointment, please call your pharmacy*   Lab Work: None ordered.  If you have labs (blood work) drawn today and your tests are completely normal, you will receive your results only by: MyChart Message (if you have MyChart) OR A paper copy in the mail If you have any lab test that is abnormal or we need to change your treatment, we will call you to review the results.   Testing/Procedures: None ordered.    Follow-Up: At [facility], you and your health needs are our priority.  As part of our continuing mission to provide you with exceptional heart care, we have created designated Provider Care Teams.  These Care Teams include your primary Cardiologist (physician) and Advanced Practice Providers (APPs -  Physician Assistants and Nurse Practitioners) who all work together to provide you with the care you need, when you need it.  We recommend signing up for the patient portal called "MyChart".  Sign up information is provided on this After Visit Summary.  MyChart is used to connect with patients for Virtual Visits (Telemedicine).  Patients are able to view lab/test results, encounter notes, upcoming appointments, etc.  Non-urgent messages can be sent to your provider as well.   To learn more about what you can do with MyChart, go to [email or web address].    Your next appointment:   Follow up as scheduled.  Important Information About Sugar       

## 2022-01-09 DIAGNOSIS — N138 Other obstructive and reflux uropathy: Secondary | ICD-10-CM

## 2022-01-09 HISTORY — DX: Benign prostatic hyperplasia with lower urinary tract symptoms: N13.8

## 2022-01-09 MED ORDER — LACTATED RINGERS IV SOLN: INTRAVENOUS | Status: DC

## 2022-01-09 MED ORDER — CEFAZOLIN SODIUM-DEXTROSE 2-4 GM/100ML-% IV SOLN
2.0000 g | INTRAVENOUS | Status: AC
  Administered 2022-01-10: 2 g via INTRAVENOUS

## 2022-01-09 MED ORDER — CHLORHEXIDINE GLUCONATE 0.12 % MT SOLN: 15.0000 mL | Freq: Once | OROMUCOSAL | Status: AC

## 2022-01-09 MED ORDER — ORAL CARE MOUTH RINSE: 15.0000 mL | Freq: Once | OROMUCOSAL | Status: AC

## 2022-01-10 ENCOUNTER — Ambulatory Visit: Payer: BC Managed Care – PPO | Admitting: Urgent Care

## 2022-01-10 ENCOUNTER — Encounter
Admission: RE | Disposition: A | Discharge status: Home / Self Care | Payer: Self-pay | Source: Home / Self Care | Attending: Urology

## 2022-01-10 ENCOUNTER — Other Ambulatory Visit: Payer: Self-pay

## 2022-01-10 ENCOUNTER — Ambulatory Visit
Admission: RE | Admit: 2022-01-10 | Discharge: 2022-01-10 | Disposition: A | Discharge status: Home / Self Care | Payer: BC Managed Care – PPO | Attending: Urology | Admitting: Urology

## 2022-01-10 ENCOUNTER — Encounter: Payer: Self-pay | Admitting: Urology

## 2022-01-10 DIAGNOSIS — N138 Other obstructive and reflux uropathy: Secondary | ICD-10-CM | POA: Insufficient documentation

## 2022-01-10 DIAGNOSIS — Z79899 Other long term (current) drug therapy: Secondary | ICD-10-CM | POA: Insufficient documentation

## 2022-01-10 DIAGNOSIS — N401 Enlarged prostate with lower urinary tract symptoms: Secondary | ICD-10-CM | POA: Diagnosis not present

## 2022-01-10 DIAGNOSIS — I509 Heart failure, unspecified: Secondary | ICD-10-CM | POA: Insufficient documentation

## 2022-01-10 DIAGNOSIS — I2583 Coronary atherosclerosis due to lipid rich plaque: Secondary | ICD-10-CM | POA: Diagnosis not present

## 2022-01-10 DIAGNOSIS — I110 Hypertensive heart disease with heart failure: Secondary | ICD-10-CM | POA: Insufficient documentation

## 2022-01-10 DIAGNOSIS — I2510 Atherosclerotic heart disease of native coronary artery without angina pectoris: Secondary | ICD-10-CM | POA: Insufficient documentation

## 2022-01-10 HISTORY — PX: TRANSURETHRAL RESECTION OF PROSTATE: SHX73

## 2022-01-10 HISTORY — DX: Atherosclerosis of aorta: I70.0

## 2022-01-10 HISTORY — DX: Family history of ischemic heart disease and other diseases of the circulatory system: Z82.49

## 2022-01-10 HISTORY — DX: Chronic systolic (congestive) heart failure: I50.22

## 2022-01-10 HISTORY — DX: Thoracic aortic ectasia: I77.810

## 2022-01-10 HISTORY — DX: Other ill-defined heart diseases: I51.89

## 2022-01-10 HISTORY — DX: Other cardiomyopathies: I42.8

## 2022-01-10 HISTORY — DX: Supraventricular tachycardia: I47.1

## 2022-01-10 HISTORY — DX: Supraventricular tachycardia, unspecified: I47.10

## 2022-01-10 SURGERY — TURP (TRANSURETHRAL RESECTION OF PROSTATE)
Anesthesia: General | Site: Prostate

## 2022-01-10 MED ORDER — FENTANYL CITRATE (PF) 100 MCG/2ML IJ SOLN
INTRAMUSCULAR | Status: AC
  Filled 2022-01-10: qty 2

## 2022-01-10 MED ORDER — SODIUM CHLORIDE 0.9 % IR SOLN
Status: DC | PRN
  Administered 2022-01-10 (×2): 3000 mL

## 2022-01-10 MED ORDER — MIDAZOLAM HCL 2 MG/2ML IJ SOLN
INTRAMUSCULAR | Status: AC
  Filled 2022-01-10: qty 2

## 2022-01-10 MED ORDER — GLYCOPYRROLATE 0.2 MG/ML IJ SOLN
INTRAMUSCULAR | Status: DC | PRN
  Administered 2022-01-10: .2 mg via INTRAVENOUS

## 2022-01-10 MED ORDER — PHENYLEPHRINE HCL (PRESSORS) 10 MG/ML IV SOLN
INTRAVENOUS | Status: AC
  Filled 2022-01-10: qty 1

## 2022-01-10 MED ORDER — CEFAZOLIN SODIUM-DEXTROSE 2-4 GM/100ML-% IV SOLN
INTRAVENOUS | Status: AC
  Filled 2022-01-10: qty 100

## 2022-01-10 MED ORDER — CHLORHEXIDINE GLUCONATE 0.12 % MT SOLN
OROMUCOSAL | Status: AC
  Administered 2022-01-10: 15 mL via OROMUCOSAL
  Filled 2022-01-10: qty 15

## 2022-01-10 MED ORDER — HYDROCODONE-ACETAMINOPHEN 5-325 MG PO TABS: 1.0000 | ORAL_TABLET | Freq: Four times a day (QID) | ORAL | 0 refills | Status: DC | PRN

## 2022-01-10 MED ORDER — MIDAZOLAM HCL 2 MG/2ML IJ SOLN
INTRAMUSCULAR | Status: DC | PRN
  Administered 2022-01-10: 2 mg via INTRAVENOUS

## 2022-01-10 MED ORDER — SUCCINYLCHOLINE CHLORIDE 200 MG/10ML IV SOSY
PREFILLED_SYRINGE | INTRAVENOUS | Status: DC | PRN
  Administered 2022-01-10: 100 mg via INTRAVENOUS

## 2022-01-10 MED ORDER — ACETAMINOPHEN 10 MG/ML IV SOLN
INTRAVENOUS | Status: DC | PRN
  Administered 2022-01-10: 1000 mg via INTRAVENOUS

## 2022-01-10 MED ORDER — SUGAMMADEX SODIUM 500 MG/5ML IV SOLN
INTRAVENOUS | Status: DC | PRN
  Administered 2022-01-10: 400 mg via INTRAVENOUS

## 2022-01-10 MED ORDER — OXYCODONE HCL 5 MG PO TABS
ORAL_TABLET | ORAL | Status: AC
  Filled 2022-01-10: qty 1

## 2022-01-10 MED ORDER — DEXAMETHASONE SODIUM PHOSPHATE 10 MG/ML IJ SOLN
INTRAMUSCULAR | Status: DC | PRN
  Administered 2022-01-10: 10 mg via INTRAVENOUS

## 2022-01-10 MED ORDER — ROCURONIUM BROMIDE 100 MG/10ML IV SOLN
INTRAVENOUS | Status: DC | PRN
  Administered 2022-01-10: 10 mg via INTRAVENOUS
  Administered 2022-01-10: 40 mg via INTRAVENOUS

## 2022-01-10 MED ORDER — FENTANYL CITRATE (PF) 100 MCG/2ML IJ SOLN
INTRAMUSCULAR | Status: DC | PRN
  Administered 2022-01-10: 25 ug via INTRAVENOUS

## 2022-01-10 MED ORDER — OXYCODONE HCL 5 MG/5ML PO SOLN: 5.0000 mg | Freq: Once | ORAL | Status: AC | PRN

## 2022-01-10 MED ORDER — ONDANSETRON HCL 4 MG/2ML IJ SOLN
INTRAMUSCULAR | Status: DC | PRN
  Administered 2022-01-10 (×2): 4 mg via INTRAVENOUS

## 2022-01-10 MED ORDER — LIDOCAINE HCL (CARDIAC) PF 100 MG/5ML IV SOSY
PREFILLED_SYRINGE | INTRAVENOUS | Status: DC | PRN
  Administered 2022-01-10: 100 mg via INTRAVENOUS

## 2022-01-10 MED ORDER — PROPOFOL 10 MG/ML IV BOLUS
INTRAVENOUS | Status: DC | PRN
  Administered 2022-01-10: 130 mg via INTRAVENOUS

## 2022-01-10 MED ORDER — FENTANYL CITRATE (PF) 100 MCG/2ML IJ SOLN: 25.0000 ug | INTRAMUSCULAR | Status: DC | PRN

## 2022-01-10 MED ORDER — OXYCODONE HCL 5 MG PO TABS
5.0000 mg | ORAL_TABLET | Freq: Once | ORAL | Status: AC | PRN
  Administered 2022-01-10: 5 mg via ORAL

## 2022-01-10 MED ORDER — PROPOFOL 1000 MG/100ML IV EMUL
INTRAVENOUS | Status: AC
  Filled 2022-01-10: qty 200

## 2022-01-10 MED ORDER — OXYBUTYNIN CHLORIDE 5 MG PO TABS: 5.0000 mg | ORAL_TABLET | Freq: Three times a day (TID) | ORAL | 0 refills | Status: DC | PRN

## 2022-01-10 SURGICAL SUPPLY — 25 items
ADAPTER IRRIG TUBE 2 SPIKE SOL (ADAPTER) ×2 IMPLANT
BAG DRAIN SIEMENS DORNER NS (MISCELLANEOUS) ×1 IMPLANT
BAG URO DRAIN 4000ML (MISCELLANEOUS) ×1 IMPLANT
CATH FOL 2WAY LX 20X30 (CATHETERS) IMPLANT
CATH FOL 2WAY LX 24X30 (CATHETERS) IMPLANT
DRAPE UTILITY 15X26 TOWEL STRL (DRAPES) ×1 IMPLANT
ELECT LOOP 22F BIPOLAR SML (ELECTROSURGICAL)
ELECTRODE LOOP 22F BIPOLAR SML (ELECTROSURGICAL) IMPLANT
GAUZE 4X4 16PLY ~~LOC~~+RFID DBL (SPONGE) ×2 IMPLANT
GLOVE BIO SURGEON STRL SZ 6.5 (GLOVE) ×1 IMPLANT
GOWN STRL REUS W/ TWL LRG LVL3 (GOWN DISPOSABLE) ×2 IMPLANT
GOWN STRL REUS W/TWL LRG LVL3 (GOWN DISPOSABLE) ×2
HOLDER FOLEY CATH W/STRAP (MISCELLANEOUS) ×1 IMPLANT
IV NS IRRIG 3000ML ARTHROMATIC (IV SOLUTION) ×6 IMPLANT
KIT TURNOVER CYSTO (KITS) ×1 IMPLANT
LOOP CUT BIPOLAR 24F LRG (ELECTROSURGICAL) IMPLANT
MANIFOLD NEPTUNE II (INSTRUMENTS) ×1 IMPLANT
PACK CYSTO AR (MISCELLANEOUS) ×1 IMPLANT
SET IRRIG Y TYPE TUR BLADDER L (SET/KITS/TRAYS/PACK) ×1 IMPLANT
SURGILUBE 2OZ TUBE FLIPTOP (MISCELLANEOUS) ×1 IMPLANT
SYR TOOMEY IRRIG 70ML (MISCELLANEOUS) ×1
SYRINGE TOOMEY IRRIG 70ML (MISCELLANEOUS) ×1 IMPLANT
TRAP FLUID SMOKE EVACUATOR (MISCELLANEOUS) ×1 IMPLANT
WATER STERILE IRR 1000ML POUR (IV SOLUTION) ×1 IMPLANT
WATER STERILE IRR 500ML POUR (IV SOLUTION) ×1 IMPLANT

## 2022-01-10 NOTE — Anesthesia Preprocedure Evaluation (Signed)
Anesthesia Evaluation  Patient identified by MRN, date of birth, ID band Patient awake    Reviewed: Allergy & Precautions, NPO status , Patient's Chart, lab work & pertinent test results  History of Anesthesia Complications Negative for: history of anesthetic complications  Airway Mallampati: III  TM Distance: >3 FB Neck ROM: full    Dental  (+) Dental Advidsory Given, Teeth Intact   Pulmonary neg pulmonary ROS, neg shortness of breath,    Pulmonary exam normal        Cardiovascular hypertension, + CAD and +CHF  (-) Past MI, (-) CABG and (-) DOE Normal cardiovascular exam     Neuro/Psych negative neurological ROS  negative psych ROS   GI/Hepatic negative GI ROS, Neg liver ROS,   Endo/Other  negative endocrine ROS  Renal/GU      Musculoskeletal   Abdominal   Peds  Hematology negative hematology ROS (+)   Anesthesia Other Findings Past Medical History: No date: Anginal pain ([place]) No date: Aortic atherosclerosis (HCC) No date: Ascending aorta dilatation (HCC)     Comment:  a.) TTE 09/29/2020: Ao root 37 mm, asc Ao 39 mm, Ao arch              33 mm; b.) TTE 08/06/2021: Ao root 37 mm 10/17/2013: CAD (coronary artery disease)     Comment:  a.) LHC 10/17/2013: EF 65%. 20% mLAD - med mgmt No date: Family history of blood clots     Comment:  a.) hypercoagulable work-up 09/2021 was negative; PT               gene mutation, Factor V leiden mutation, Protein C, S,               and AT III all normal/negative 10/16/2013: Fatigue due to sleep pattern disturbance No date: GERD (gastroesophageal reflux disease) No date: Heart failure with mid-range ejection fraction (HFmEF) ([place])     Comment:  a.) TTE 09/29/2020: EF 45-50%, sev bas-mid inferior HK,               GLS -15.5%, mild MR, G1DD; b.) TTE 08/06/2021: EF 45-50%,              no RWMAs, GLS -16.5%, mild MR, G2DD 01/2020: History of 2019 novel coronavirus disease  (COVID-19) No date: Hypertension 12/09/2013: Implantable loop recorder     Comment:  a.) Medtronic ILR device No date: NICM (nonischemic cardiomyopathy) ([place])     Comment:  a.) TTE 10/08/2014: EF 50-55%; b.) TTE 09/29/2020: EF               45-50%; c.) TTE 08/06/2021: EF 45-50% 05/2014: Nonsustained ventricular tachycardia ([place])     Comment:  a.) noted on ILR in 05/2014; b.) holter 10/2021: NSVT x               2 episodes; longest lasted 6 beats at a rate of 197 bpm 01/2020: Pneumonia due to COVID-19 virus     Comment:  a.) hospitalized and Tx'd with MAB infusions No date: Prostatism No date: PSVT (paroxysmal supraventricular tachycardia)     Comment:  a.) holter 10/2021: 148 SVT episodes with max HR 226 bpm No date: Sinus tachycardia 10/16/2013: Syncope  Past Surgical History: 11/24/2020: COLONOSCOPY WITH PROPOFOL; N/A     Comment:  Procedure: COLONOSCOPY WITH PROPOFOL;  Surgeon: Lucilla Lame, MD;  Location: [facility] ENDOSCOPY;  Service:  Endoscopy;  Laterality: N/A; 10/17/2013: LEFT HEART CATHETERIZATION WITH CORONARY ANGIOGRAM; N/A     Comment:  Procedure: LEFT HEART CATHETERIZATION WITH CORONARY               ANGIOGRAM;  Surgeon: Kathleene Hazel, MD;                Location: [facility] CATH LAB;  Service: Cardiovascular;                Laterality: N/A; 12/09/2013: LOOP RECORDER IMPLANT; N/A     Comment:  Procedure: LOOP RECORDER IMPLANT;  Surgeon: Duke Salvia, MD;  Location: [facility] CATH LAB;  Service:               Cardiovascular;  Laterality: N/A;  BMI    Body Mass Index: 28.00 kg/m      Reproductive/Obstetrics negative OB ROS                             Anesthesia Physical Anesthesia Plan  ASA: 3  Anesthesia Plan: General ETT   Post-op Pain Management:    Induction: Intravenous  PONV Risk Score and Plan: 3 and Ondansetron, Dexamethasone, Midazolam and Treatment may vary due to age or medical  condition  Airway Management Planned: Oral ETT  Additional Equipment:   Intra-op Plan:   Post-operative Plan: Extubation in OR  Informed Consent: I have reviewed the patients History and Physical, chart, labs and discussed the procedure including the risks, benefits and alternatives for the proposed anesthesia with the patient or authorized representative who has indicated his/her understanding and acceptance.     Dental Advisory Given  Plan Discussed with: Anesthesiologist, CRNA and Surgeon  Anesthesia Plan Comments: (Patient consented for risks of anesthesia including but not limited to:  - adverse reactions to medications - damage to eyes, teeth, lips or other oral mucosa - nerve damage due to positioning  - sore throat or hoarseness - Damage to heart, brain, nerves, lungs, other parts of body or loss of life  Patient voiced understanding.)        Anesthesia Quick Evaluation

## 2022-01-10 NOTE — Transfer of Care (Signed)
Immediate Anesthesia Transfer of Care Note  Patient: [name]  Procedure(s) Performed: TRANSURETHRAL RESECTION OF THE PROSTATE (TURP) (Prostate)  Patient Location: PACU  Anesthesia Type:General  Level of Consciousness: awake, drowsy and patient cooperative  Airway & Oxygen Therapy: Patient Spontanous Breathing and Patient connected to face mask oxygen  Post-op Assessment: Report given to RN and Post -op Vital signs reviewed and stable  Post vital signs: Reviewed and stable  Last Vitals:  Vitals Value Taken Time  BP    Temp    Pulse    Resp    SpO2      Last Pain:  Vitals:   01/10/22 0624  TempSrc: Temporal  PainSc: 0-No pain         Complications: No notable events documented.

## 2022-01-10 NOTE — Op Note (Signed)
Date of procedure: 01/10/22  Preoperative diagnosis:  BPH with urinary obstruction  Postoperative diagnosis:  Same  Procedure: transurethral resection of the prostate  Surgeon: Hollice Espy, MD  Anesthesia: General  Complications: None  Intraoperative findings: Bilobar coaptation with mildly elevated bladder neck.  Excellent hemostasis.  EBL: Minimal  Specimens: Prostate chips  Drains: French Foley catheter  Indication: [name] is a 61 y.o. patient with mixed urinary symptoms secondary to BPH.  After reviewing the management options for treatment, he elected to proceed with the above surgical procedure(s). We have discussed the potential benefits and risks of the procedure, side effects of the proposed treatment, the likelihood of the patient achieving the goals of the procedure, and any potential problems that might occur during the procedure or recuperation. Informed consent has been obtained.  Description of procedure:  The patient was taken to the operating room and general anesthesia was induced.  The patient was placed in the dorsal lithotomy position, prepped and draped in the usual sterile fashion, and preoperative antibiotics were administered. A preoperative time-out was performed.   A 26 French resectoscope was introduced using a blunt angled obturator.  A working element was then brought in.  Careful inspection revealed a mildly elevated bladder neck with bilobar coaptation of the prostate and a small intravesical median component.  Using bipolar energy and saline as the medium, I began resecting the median lobe component down to the bladder neck fibers.  Then worked on each lateral lobe with care taken to avoid any resection beyond the verumontanum.  Prostate chips were pushed into the bladder.  These were then irrigated using a Toomey syringe and removed.  After a large wide open channel was created, careful and adequate hemostasis was achieved.  At the end, there  was a widely patent channel and minimal bleeding.  All chips have been evacuated.  The scope was removed.  A 20 French two-way Foley catheter was placed with 30 cc in the balloon.  It was irrigated and irrigated easily to light pink.  Patient was then cleaned and dried, repositioned in supine position, reversed of anesthesia, and taken to the PACU in stable condition.  Plan: He will return Wednesday for voiding trial.  Reassess urinary symptoms in 6 to 8 weeks with IPSS/PVR.  Hollice Espy, M.D.

## 2022-01-10 NOTE — Anesthesia Procedure Notes (Signed)
Procedure Name: Intubation Date/Time: 01/10/2022 7:54 AM  Performed by: Kelton Pillar, CRNAPre-anesthesia Checklist: Patient identified, Emergency Drugs available, Suction available and Patient being monitored Patient Re-evaluated:Patient Re-evaluated prior to induction Oxygen Delivery Method: Circle system utilized Preoxygenation: Pre-oxygenation with 100% oxygen Induction Type: IV induction Ventilation: Mask ventilation without difficulty Laryngoscope Size: McGraph and 4 Grade View: Grade I Tube type: Oral Tube size: 7.0 mm Number of attempts: 1 Airway Equipment and Method: Stylet and Oral airway Placement Confirmation: ETT inserted through vocal cords under direct vision, positive ETCO2, breath sounds checked- equal and bilateral and CO2 detector Secured at: 21 cm Tube secured with: Tape Dental Injury: Teeth and Oropharynx as per pre-operative assessment

## 2022-01-10 NOTE — Discharge Instructions (Addendum)
AMBULATORY SURGERY  DISCHARGE INSTRUCTIONS   The drugs that you were given will stay in your system until tomorrow so for the next 24 hours you should not:  Drive an automobile Make any legal decisions Drink any alcoholic beverage   You may resume regular meals tomorrow.  Today it is better to start with liquids and gradually work up to solid foods.  You may eat anything you prefer, but it is better to start with liquids, then soup and crackers, and gradually work up to solid foods.   Please notify your doctor immediately if you have any unusual bleeding, trouble breathing, redness and pain at the surgery site, drainage, fever, or pain not relieved by medication.    Additional Instructions:        Please contact your physician with any problems or Same Day Surgery at [phone], Monday through Friday 6 am to 4 pm, or [place] at [facility] number at [phone].      [facility] [address] [place], [place]  [address] Phone:  [phone]   January 10, 2022  Patient: [name]  Date of Birth: 1961/03/24  Date of Visit: January 10, 2022    To Whom It May Concern:  [name] was seen and treated on January 10, 2022 and will follow up with the surgeon on Wednesday 10/4. Please excuse from work until released by Psychologist, sport and exercise.    .           If you have any questions or concerns, please don't hesitate to call.   Sincerely,       Treatment Team:  Attending Provider: Hollice Espy, MD

## 2022-01-10 NOTE — H&P (Signed)
01/10/22 RRR CTAB  CC:      Chief Complaint  Patient presents with   Cysto      TRUS        HPI: [name] is a 61 y.o. male with a personal history of BPH with LUTS and glucosuria who presents today for diagnostic cystoscopy.      He has a personal history of BPH managed on Flomax since 2015.  He believes he may have been seen by Dr. Eliberto Ivory at that time and told his prostate was "swollen".   He was seen by his PCP, Dr Elyn Aquas on 09/28/2021. He was noted to have chronic problems with BPH.    His most recent PSA on 09/28/2021 was 1.4.       Vitals:    11/23/21 0907  BP: 119/79  Pulse: 65    NED. A&Ox3.   No respiratory distress   Abd soft, NT, ND Normal phallus with bilateral descended testicles   Cystoscopy Procedure Note   Patient identification was confirmed, informed consent was obtained, and patient was prepped using Betadine solution.  Lidocaine jelly was administered per urethral meatus.       Pre-Procedure: - Inspection reveals a normal caliber ureteral meatus.   Procedure: The flexible cystoscope was introduced without difficulty - No urethral strictures/lesions are present. - Normal prostate median lobe  - Normal bladder neck - Bilateral ureteral orifices identified - Bladder mucosa  reveals no ulcers, tumors, or lesions - No bladder stones - No trabeculation   Retroflexion shows moderate size well circumscribed median lobe      Post-Procedure: - Patient tolerated the procedure well     Prostate transrectal ultrasound sizing   Informed consent was obtained after discussing risks/benefits of the procedure.  A time out was performed to ensure correct patient identity.   Pre-Procedure: -Transrectal probe was placed without difficulty -Transrectal Ultrasound performed revealing a 41.8  gm prostate measuring 5.2 x 3.24 x 4.74 cm (length) -No significant hypoechoic   Past Medical History:  Diagnosis Date   Anginal pain ([place])    Aortic  atherosclerosis (HCC)    Ascending aorta dilatation ([place])    a.) TTE 09/29/2020: Ao root 37 mm, asc Ao 39 mm, Ao arch 33 mm; b.) TTE 08/06/2021: Ao root 37 mm   CAD (coronary artery disease) 10/17/2013   a.) LHC 10/17/2013: EF 65%. 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) ([place])    a.) TTE 09/29/2020: EF 45-50%, sev bas-mid inferior HK, GLS -15.5%, mild MR, G1DD; b.) TTE 08/06/2021: EF 45-50%, no RWMAs, GLS -16.5%, mild MR, G2DD   History of 2019 novel coronavirus disease (COVID-19) 01/2020   Hypertension    Implantable loop recorder 12/09/2013   a.) Medtronic ILR device   NICM (nonischemic cardiomyopathy) ([place])    a.) TTE 10/08/2014: EF 50-55%; b.) TTE 09/29/2020: EF 45-50%; c.) TTE 08/06/2021: EF 45-50%   Nonsustained ventricular tachycardia ([place]) 05/2014   a.) noted on ILR in 05/2014; b.) holter 10/2021: NSVT x 2 episodes; longest lasted 6 beats at a rate of 197 bpm   Pneumonia due to COVID-19 virus 01/2020   a.) hospitalized and Tx'd with MAB infusions   Prostatism  PSVT (paroxysmal supraventricular tachycardia)    a.) holter 10/2021: 148 SVT episodes with max HR 226 bpm   Sinus tachycardia    Syncope 10/16/2013   Current Meds  Medication Sig   atorvastatin (LIPITOR) 40 MG tablet TAKE 1 TABLET BY MOUTH ONCE DAILY   ENTRESTO 24-26 MG TAKE 1 TABLET BY MOUTH TWICE DAILY   famotidine (PEPCID) 20 MG tablet One an hour before bedtime   metoprolol tartrate (LOPRESSOR) 25 MG tablet Take 1 tablet (25 mg total) by mouth 2 (two) times daily.   omeprazole (PRILOSEC) 20 MG capsule Take 2  x 30-60   min before first meal   spironolactone (ALDACTONE) 25 MG tablet Take 0.5 tablets (12.5 mg  total) by mouth daily.   Allergies  Allergen Reactions   Amoxicillin Rash     Assessment/ Plan:   BPH with LUTS  - Cystoscopy revealed median lobe but otherwise unremarkable  - We discussed outlet procedure such as a TURP as HoLEP - Risk include infection, damage to bladder or surrounding structures, retrograde ejaculation, increased irritative urinary symptoms, hematuria amongst others.  All questions answered. - He would like to proceed with TURP    Conley Rolls as a scribe for Hollice Espy, MD.,have documented all relevant documentation on the behalf of Hollice Espy, MD,as directed by  Hollice Espy, MD while in the presence of Hollice Espy, MD.   I have reviewed the above documentation for accuracy and completeness, and I agree with the above.    Hollice Espy, MD

## 2022-01-11 ENCOUNTER — Encounter: Payer: Self-pay | Admitting: Urology

## 2022-01-11 NOTE — Anesthesia Postprocedure Evaluation (Signed)
Anesthesia Post Note  Patient: [name]  Procedure(s) Performed: TRANSURETHRAL RESECTION OF THE PROSTATE (TURP) (Prostate)  Patient location during evaluation: PACU Anesthesia Type: General Level of consciousness: awake and alert Pain management: pain level controlled Vital Signs Assessment: post-procedure vital signs reviewed and stable Respiratory status: spontaneous breathing, nonlabored ventilation, respiratory function stable and patient connected to nasal cannula oxygen Cardiovascular status: blood pressure returned to baseline and stable Postop Assessment: no apparent nausea or vomiting Anesthetic complications: no   No notable events documented.   Last Vitals:  Vitals:   01/10/22 0915 01/10/22 0931  BP: (!) 127/90 (!) 127/91  Pulse: 61 (!) 54  Resp: 12 14  Temp: (!) 36.4 C (!) 36.4 C  SpO2: 100% 99%    Last Pain:  Vitals:   01/10/22 0931  TempSrc: Temporal  PainSc: 0-No pain                 Dimas Millin

## 2022-01-12 ENCOUNTER — Encounter: Payer: Self-pay | Admitting: Physician Assistant

## 2022-01-12 ENCOUNTER — Ambulatory Visit: Payer: BC Managed Care – PPO | Admitting: Physician Assistant

## 2022-01-12 VITALS — Ht 75.0 in | Wt 224.0 lb

## 2022-01-12 DIAGNOSIS — N138 Other obstructive and reflux uropathy: Secondary | ICD-10-CM

## 2022-01-12 DIAGNOSIS — N401 Enlarged prostate with lower urinary tract symptoms: Secondary | ICD-10-CM | POA: Diagnosis not present

## 2022-01-12 LAB — SURGICAL PATHOLOGY

## 2022-01-12 LAB — BLADDER SCAN AMB NON-IMAGING

## 2022-01-12 NOTE — Patient Instructions (Signed)
Congratulations on your recent TURP procedure! As discussed in clinic today, these are the three normal postoperative findings after this surgery: Burning or pain with urination: This typically resolves within 1 week of surgery. If you are still having significant pain with urination 10 days after surgery, please call our clinic. We may need to check you for a urinary tract infection at that point, though this is rare. Blood in the urine: This may either come and go or steadily improve before going away, but typically resolves completely within 3 weeks of surgery. If you are on blood thinners, it may take longer for the bleeding to resolve. Please note that you may find that you pass clumps of tissue or debris around 10-14 days after surgery associated with some new bleeding. This is due to sloughing of your postoperative scab and is also normal. If at any point you start to pass dark red urine; thick, ketchup-like urine; or large blood clots around the size of your palm, please call our office immediately. Urinary leakage or urgency: This tends to improve with time, with most patients becoming dry within around 3 months of surgery. You may wear absorbant underwear or liners for security during this time. To help you get dry faster, please make sure you are completing your Kegel exercises as instructed, with a set of 10 exercises completed up to three times daily. Further information on Kegel exercises can be found in the back of this packet.  

## 2022-01-12 NOTE — Progress Notes (Signed)
Catheter Removal  Patient is present today for a catheter removal.  9ml of water was drained from the balloon. A 16FR foley cath was removed from the bladder, no complications were noted. Patient tolerated well.  Performed by: Breck Coons

## 2022-01-12 NOTE — Progress Notes (Signed)
Afternoon follow-up  Patient returned to clinic this afternoon for repeat PVR. He has been able to urinate. He has not had urinary leakage. PVR 64mL.  Results for orders placed or performed in visit on 01/12/22  BLADDER SCAN AMB NON-IMAGING  Result Value Ref Range   Scan Result 54ml     Voiding trial passed. Counseled patient on normal postoperative findings including dysuria, gross hematuria, and urinary urgency/leakage. Counseled patient to begin Kegel exercises 3x10 sets daily to increase urinary control and wear absorbent products as needed for security. Written and verbal resources provided today. Surgical pathology negative; results shared with the patient.   Follow up: 6 week postop f/u with Dr. Erlene Quan

## 2022-01-13 ENCOUNTER — Telehealth: Payer: BC Managed Care – PPO | Admitting: Internal Medicine

## 2022-01-20 ENCOUNTER — Ambulatory Visit: Payer: Self-pay

## 2022-01-20 NOTE — Telephone Encounter (Signed)
Reason for Disposition  [1] MODERATE pain (e.g., interferes with normal activities) AND [2] high-risk adult (e.g., age > 67 years, osteoporosis, chronic steroid use)  Answer Assessment - Initial Assessment Questions 1. MECHANISM: "How did the injury happen?"     Bending over in the yard 2. ONSET: "When did the injury happen?" (Minutes or hours ago)     01/17/2022 3. LOCATION: "Where on the chest is the injury located?"     Right side 4. APPEARANCE: "What does the injury look like?"     No discoloration 5. BLEEDING: "Is there any bleeding now? If Yes, ask: How long has it been bleeding?"     no 6. SEVERITY: "Any difficulty with breathing?"     no 7. SIZE: For cuts, bruises, or swelling, ask: "How large is it?" (e.g., inches or centimeters)     no 8. PAIN: "Is there pain?" If Yes, ask: "How bad is the pain?"   (e.g., Scale 1-10; or mild, moderate, severe)     Yes. All the time. Worse with movement. 9. TETANUS: For any breaks in the skin, ask: "When was the last tetanus booster?"      10. PREGNANCY: "Is there any chance you are pregnant?" "When was your last menstrual period?"  Protocols used: Chest Injury-A-AH

## 2022-01-20 NOTE — Progress Notes (Signed)
BP 108/69   Pulse 60   Temp 97.6 F (36.4 C) (Oral)   Wt 222 lb 9.6 oz (101 kg)   SpO2 96%   BMI 27.82 kg/m    Subjective:    Patient ID: [name], male    DOB: July 21, 1960, 61 y.o.   MRN: [id number]  HPI: [name] is a 61 y.o. male  Chief Complaint  Patient presents with   Rib Injury    Pt reports ?injury to R ribs , pt c/o pain. Patient also reports issues with L hand after IV, pt reports hand looks a lot better this morning     RIB INJURY Patient states about a year ago he bent over in his yard and felt something crack and ended up with a Rib fracture of the 10th rib.  States on Monday, he went to pick up his dog and felt the same sound.  He isn't able to sleep.  States can't sleep on either side.  Very uncomfortable for him to even sit to drive.  Lidocaine patch is not helping with his pain.     Relevant past medical, surgical, family and social history reviewed and updated as indicated. Interim medical history since our last visit reviewed. Allergies and medications reviewed and updated.  Review of Systems  Musculoskeletal:        Rib pain    Per HPI unless specifically indicated above     Objective:    BP 108/69   Pulse 60   Temp 97.6 F (36.4 C) (Oral)   Wt 222 lb 9.6 oz (101 kg)   SpO2 96%   BMI 27.82 kg/m   Wt Readings from Last 3 Encounters:  01/21/22 222 lb 9.6 oz (101 kg)  01/12/22 224 lb (101.6 kg)  01/10/22 224 lb (101.6 kg)    Physical Exam Vitals and nursing note reviewed.  Constitutional:      General: He is not in acute distress.    Appearance: Normal appearance. He is not ill-appearing, toxic-appearing or diaphoretic.  HENT:     Head: Normocephalic.     Right Ear: External ear normal.     Left Ear: External ear normal.     Nose: Nose normal. No congestion or rhinorrhea.     Mouth/Throat:     Mouth: Mucous membranes are moist.  Eyes:     General:        Right eye: No discharge.        Left eye: No discharge.      Extraocular Movements: Extraocular movements intact.     Conjunctiva/sclera: Conjunctivae normal.     Pupils: Pupils are equal, round, and reactive to light.  Cardiovascular:     Rate and Rhythm: Normal rate and regular rhythm.     Heart sounds: No murmur heard. Pulmonary:     Effort: Pulmonary effort is normal. No respiratory distress.     Breath sounds: Normal breath sounds. No wheezing, rhonchi or rales.  Abdominal:     General: Abdomen is flat. Bowel sounds are normal.  Musculoskeletal:     Cervical back: Normal range of motion and neck supple.       Back:  Skin:    General: Skin is warm and dry.     Capillary Refill: Capillary refill takes less than 2 seconds.  Neurological:     General: No focal deficit present.     Mental Status: He is alert and oriented to person, place, and time.  Psychiatric:  Mood and Affect: Mood normal.        Behavior: Behavior normal.        Thought Content: Thought content normal.        Judgment: Judgment normal.     Results for orders placed or performed in visit on 01/12/22  BLADDER SCAN AMB NON-IMAGING  Result Value Ref Range   Scan Result 36ml       Assessment & Plan:   Problem List Items Addressed This Visit   None Visit Diagnoses     Rib pain    -  Primary   Will order xray of ribs to evaluate for fracture. Will make recommendaitons based on imaging. Toradol given in office. Follow up if not improved.   Relevant Medications   ketorolac (TORADOL) injection 60 mg   Other Relevant Orders   DG Ribs Unilateral Right   Fracture       Relevant Orders   DG Bone Density        Follow up plan: No follow-ups on file.

## 2022-01-20 NOTE — Telephone Encounter (Signed)
  Chief Complaint: Rib pain - possible fracture Symptoms: Pain Frequency: 01/17/2022 Pertinent Negatives: Patient denies Broken skin, difficulty breathing Disposition: [] ED /[] Urgent Care (no appt availability in office) / [x] Appointment(In office/virtual)/ []  [facility]/ [] Home Care/ [] Refused Recommended Disposition /[] [facility]/ []  Follow-up with PCP Additional Notes: Pt broke a rib on his left side last year. On Monday pt was bending over and heard a "crack" on his right side, followed by rib pain. Pt also has a hematoma from IV attempt on hand. Pt states that Dr. Neomia Dear talked about testing for osteoporosis, however there was no testing/follow up.

## 2022-01-21 ENCOUNTER — Ambulatory Visit
Admission: RE | Admit: 2022-01-21 | Discharge: 2022-01-21 | Disposition: A | Discharge status: Home / Self Care | Payer: BC Managed Care – PPO | Source: Ambulatory Visit | Attending: Nurse Practitioner | Admitting: Nurse Practitioner

## 2022-01-21 ENCOUNTER — Ambulatory Visit: Payer: BC Managed Care – PPO | Admitting: Nurse Practitioner

## 2022-01-21 ENCOUNTER — Encounter: Payer: Self-pay | Admitting: Nurse Practitioner

## 2022-01-21 ENCOUNTER — Ambulatory Visit
Admission: RE | Admit: 2022-01-21 | Discharge: 2022-01-21 | Disposition: A | Discharge status: Home / Self Care | Payer: BC Managed Care – PPO | Attending: Nurse Practitioner | Admitting: Nurse Practitioner

## 2022-01-21 VITALS — BP 108/69 | HR 60 | Temp 97.6°F | Wt 222.6 lb

## 2022-01-21 DIAGNOSIS — T148XXA Other injury of unspecified body region, initial encounter: Secondary | ICD-10-CM

## 2022-01-21 DIAGNOSIS — R0781 Pleurodynia: Secondary | ICD-10-CM | POA: Insufficient documentation

## 2022-01-21 MED ORDER — IBUPROFEN 800 MG PO TABS: 800.0000 mg | ORAL_TABLET | Freq: Three times a day (TID) | ORAL | 0 refills | Status: DC | PRN

## 2022-01-21 MED ORDER — CYCLOBENZAPRINE HCL 10 MG PO TABS: 10.0000 mg | ORAL_TABLET | Freq: Every day | ORAL | 0 refills | Status: DC

## 2022-01-21 MED ORDER — KETOROLAC TROMETHAMINE 60 MG/2ML IM SOLN
60.0000 mg | Freq: Once | INTRAMUSCULAR | Status: AC
  Administered 2022-01-21: 60 mg via INTRAMUSCULAR

## 2022-01-21 NOTE — Progress Notes (Signed)
Please let patient know that there is no evidence of rib fracture.  He likely strained a muscle.  I have sent in a prescription for flexeril (a muscle relaxer) to help with the pain.  This should be taken at bedtime as it can cause drowsiness.  I have also sent in a prescription for Ibuprofen 800mg  that he can take three times a day to help with the pain.

## 2022-01-21 NOTE — Addendum Note (Signed)
Addended by: Jon Billings on: 01/21/2022 12:43 PM   Modules accepted: Orders

## 2022-01-21 NOTE — Patient Instructions (Signed)
Please call to schedule your mammogram and/or bone density: [facility]  Address: [address], [place], [place] [address] Phone: [phone]  [facility] [place] [address] [place],  [place]  [address] Phone: [phone]   

## 2022-01-31 ENCOUNTER — Encounter: Payer: Self-pay | Admitting: Urology

## 2022-02-01 ENCOUNTER — Ambulatory Visit
Admission: RE | Admit: 2022-02-01 | Discharge: 2022-02-01 | Disposition: A | Discharge status: Home / Self Care | Payer: BC Managed Care – PPO | Source: Ambulatory Visit | Attending: Nurse Practitioner | Admitting: Nurse Practitioner

## 2022-02-01 DIAGNOSIS — T148XXA Other injury of unspecified body region, initial encounter: Secondary | ICD-10-CM | POA: Insufficient documentation

## 2022-02-01 DIAGNOSIS — M810 Age-related osteoporosis without current pathological fracture: Secondary | ICD-10-CM | POA: Diagnosis not present

## 2022-02-01 MED ORDER — ALENDRONATE SODIUM 70 MG PO TABS: 70.0000 mg | ORAL_TABLET | ORAL | 3 refills | Status: DC

## 2022-02-01 NOTE — Progress Notes (Signed)
Please let patient know that his Bone Density scan does show that he has osteoporosis.  I recommend he start Fosamax once weekly to help with this.  If he agrees to the medication he needs to take it on an empty stomach with only water and sit up for at least 30 minutes after taking the medication.  He will have a repeat Dexa scan in 2 years.

## 2022-02-02 ENCOUNTER — Other Ambulatory Visit: Payer: Self-pay | Admitting: Internal Medicine

## 2022-02-21 ENCOUNTER — Other Ambulatory Visit (HOSPITAL_COMMUNITY): Payer: Self-pay | Admitting: Internal Medicine

## 2022-02-21 ENCOUNTER — Other Ambulatory Visit: Payer: Self-pay | Admitting: Internal Medicine

## 2022-02-22 ENCOUNTER — Other Ambulatory Visit: Payer: Self-pay

## 2022-02-22 MED ORDER — OMEPRAZOLE 20 MG PO CPDR: 20.0000 mg | DELAYED_RELEASE_CAPSULE | Freq: Two times a day (BID) | ORAL | 0 refills | Status: DC

## 2022-02-25 ENCOUNTER — Ambulatory Visit: Payer: BC Managed Care – PPO | Admitting: Internal Medicine

## 2022-03-01 ENCOUNTER — Encounter: Payer: Self-pay | Admitting: Urology

## 2022-03-01 ENCOUNTER — Telehealth: Payer: Self-pay | Admitting: Urology

## 2022-03-01 ENCOUNTER — Ambulatory Visit (INDEPENDENT_AMBULATORY_CARE_PROVIDER_SITE_OTHER): Payer: BC Managed Care – PPO | Admitting: Urology

## 2022-03-01 VITALS — BP 116/74 | HR 64 | Ht 75.0 in | Wt 220.0 lb

## 2022-03-01 DIAGNOSIS — R350 Frequency of micturition: Secondary | ICD-10-CM

## 2022-03-01 DIAGNOSIS — N401 Enlarged prostate with lower urinary tract symptoms: Secondary | ICD-10-CM

## 2022-03-01 DIAGNOSIS — N3941 Urge incontinence: Secondary | ICD-10-CM

## 2022-03-01 DIAGNOSIS — N138 Other obstructive and reflux uropathy: Secondary | ICD-10-CM

## 2022-03-01 DIAGNOSIS — R3915 Urgency of urination: Secondary | ICD-10-CM

## 2022-03-01 DIAGNOSIS — R351 Nocturia: Secondary | ICD-10-CM

## 2022-03-01 DIAGNOSIS — N3000 Acute cystitis without hematuria: Secondary | ICD-10-CM

## 2022-03-01 LAB — URINALYSIS, COMPLETE
Bilirubin, UA: NEGATIVE
Ketones, UA: NEGATIVE
Nitrite, UA: NEGATIVE
Specific Gravity, UA: 1.02 (ref 1.005–1.030)
Urobilinogen, Ur: 0.2 mg/dL (ref 0.2–1.0)
pH, UA: 7 (ref 5.0–7.5)

## 2022-03-01 LAB — MICROSCOPIC EXAMINATION: RBC, Urine: >30 /hpf — AB (ref 0–2)

## 2022-03-01 LAB — BLADDER SCAN AMB NON-IMAGING: Scan Result: 0

## 2022-03-01 MED ORDER — SULFAMETHOXAZOLE-TRIMETHOPRIM 800-160 MG PO TABS: 1.0000 | ORAL_TABLET | Freq: Two times a day (BID) | ORAL | 0 refills | Status: AC

## 2022-03-01 NOTE — Telephone Encounter (Signed)
This patient left right after his urine sample.  Please let him know that I did in fact look infected.  Organ to treat him with Bactrim DS twice daily for 7 days.  Please call this in.  Will let him know if we need to change the antibiotic based on his urine culture today.  I would like him to return in about 6 weeks to see Sam or Carollee Herter for symptom recheck.  Will also recheck a urine on that day as well with IPSS/PVR/UA.  Vanna Scotland, MD

## 2022-03-01 NOTE — Telephone Encounter (Signed)
Spoke with patient and advised results rx sent to pharmacy by e-script Appt scheduled 

## 2022-03-01 NOTE — Progress Notes (Signed)
03/01/2022 11:51 AM   [name] 03-Jun-1960 [id number]  Referring provider: No referring provider defined for this encounter.  Chief Complaint  Patient presents with   Abdominal Pain    8 week follow up     HPI: 61 year old male with a personal history of BPH status post TURP on 01/10/2022 who returns today for postop follow-up.  Notably, he was previously managed on Flomax with worsening urinary symptoms.  He continues to have refractory issues and noted to have a bulbous circumscribes median lobe with a relatively small prostate, 41.8 g.  He elected for TURP.  He reports today over the first 2 weeks after having the catheter removed, he had a few days of burning, urgency frequency but this resolved.  He had a few good weeks but then about 2 weeks ago, child having sudden onset worsening urinary urgency, frequency, nocturia and episodes of near urge incontinence which is extremely bothersome to him.  He denied any overt burning.  He did pass a few scab-like materials last week.  Surgical pathology consistent with benign glandular and stromal hyperplasia.  PVR today 0.   IPSS     Row Name 03/01/22 0800         International Prostate Symptom Score   How often have you had the sensation of not emptying your bladder? Almost always     How often have you had to urinate less than every two hours? Almost always     How often have you found you stopped and started again several times when you urinated? Almost always     How often have you found it difficult to postpone urination? Almost always     How often have you had a weak urinary stream? More than half the time     How often have you had to strain to start urination? About half the time     Total IPSS Score 27       Quality of Life due to urinary symptoms   If you were to spend the rest of your life with your urinary condition just the way it is now how would you feel about that? Unhappy              Score:  1-7  Mild 8-19 Moderate 20-35 Severe    PMH: Past Medical History:  Diagnosis Date   Anginal pain (HCC)    Aortic atherosclerosis (HCC)    Ascending aorta dilatation (HCC)    a.) TTE 09/29/2020: Ao root 37 mm, asc Ao 39 mm, Ao arch 33 mm; b.) TTE 08/06/2021: Ao root 37 mm   CAD (coronary artery disease) 10/17/2013   a.) LHC 10/17/2013: EF 65%. 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) (HCC)    a.) TTE 09/29/2020: EF 45-50%, sev bas-mid inferior HK, GLS -15.5%, mild MR, G1DD; b.) TTE 08/06/2021: EF 45-50%, no RWMAs, GLS -16.5%, mild MR, G2DD   History of 2019 novel coronavirus disease (COVID-19) 01/2020   Hypertension    Implantable loop recorder 12/09/2013   a.) [employer]tronic ILR device   NICM (nonischemic cardiomyopathy) (HCC)    a.) TTE 10/08/2014: EF 50-55%; b.) TTE 09/29/2020: EF 45-50%; c.) TTE  08/06/2021: EF 45-50%   Nonsustained ventricular tachycardia (HCC) 05/2014   a.) noted on ILR in 05/2014; b.) holter 10/2021: NSVT x 2 episodes; longest lasted 6 beats at a rate of 197 bpm   Pneumonia due to COVID-19 virus 01/2020   a.) hospitalized and Tx'd with MAB infusions   Prostatism    PSVT (paroxysmal supraventricular tachycardia)    a.) holter 10/2021: 148 SVT episodes with max HR 226 bpm   Sinus tachycardia    Syncope 10/16/2013    Surgical History: Past Surgical History:  Procedure Laterality Date   COLONOSCOPY WITH PROPOFOL N/A 11/24/2020   Procedure: COLONOSCOPY WITH PROPOFOL;  Surgeon: [name], MD;  Location: [facility] ENDOSCOPY;  Service: Endoscopy;  Laterality: N/A;   LEFT HEART CATHETERIZATION WITH CORONARY ANGIOGRAM N/A 10/17/2013   Procedure: LEFT HEART CATHETERIZATION  WITH CORONARY ANGIOGRAM;  Surgeon: Kathleene Hazel, MD;  Location: [facility] CATH LAB;  Service: Cardiovascular;  Laterality: N/A;   LOOP RECORDER IMPLANT N/A 12/09/2013   Procedure: LOOP RECORDER IMPLANT;  Surgeon: Duke Salvia, MD;  Location: [facility] CATH LAB;  Service: Cardiovascular;  Laterality: N/A;   TRANSURETHRAL RESECTION OF PROSTATE N/A 01/10/2022   Procedure: TRANSURETHRAL RESECTION OF THE PROSTATE (TURP);  Surgeon: Vanna Scotland, MD;  Location: [facility] ORS;  Service: Urology;  Laterality: N/A;    Home Medications:  Allergies as of 03/01/2022       Reactions   Amoxicillin Rash        Medication List        Accurate as of March 01, 2022 11:51 AM. If you have any questions, ask your nurse or doctor.          STOP taking these medications    cyclobenzaprine 10 MG tablet Commonly known as: FLEXERIL Stopped by: Vanna Scotland, MD   HYDROcodone-acetaminophen 5-325 MG tablet Commonly known as: NORCO/VICODIN Stopped by: Vanna Scotland, MD   ibuprofen 800 MG tablet Commonly known as: ADVIL Stopped by: Vanna Scotland, MD   oxybutynin 5 MG tablet Commonly known as: DITROPAN Stopped by: Vanna Scotland, MD       TAKE these medications    alendronate 70 MG tablet Commonly known as: FOSAMAX Take 1 tablet (70 mg total) by mouth every 7 (seven) days. Take with a full glass of water on an empty stomach.   aspirin 81 MG chewable tablet Chew 81 mg by mouth daily.   atorvastatin 40 MG tablet Commonly known as: LIPITOR Take 1 tablet (40 mg total) by mouth daily. Please call [phone] to schedule an overdue appointment with Dr. Sherryl Manges for future refills. Thank you. 1st attempt.   Entresto 24-26 MG Generic drug: sacubitril-valsartan TAKE 1 TABLET BY MOUTH TWICE DAILY   famotidine 20 MG tablet Commonly known as: PEPCID TAKE 1 TABLET BY MOUTH 1 HOUR BEFORE BEDTIME   Jardiance 10 MG Tabs tablet Generic drug: empagliflozin TAKE 1 TABLET BY MOUTH ONCE EVERY  MORNING WITH BREAKFAST   metoprolol tartrate 25 MG tablet Commonly known as: LOPRESSOR Take 1 tablet (25 mg total) by mouth 2 (two) times daily.   omeprazole 20 MG capsule Commonly known as: PRILOSEC Take 1 capsule (20 mg total) by mouth 2 (two) times daily before a meal.   spironolactone 25 MG tablet Commonly known as: ALDACTONE Take 0.5 tablets (12.5 mg total) by mouth daily.   tamsulosin 0.4 MG Caps capsule Commonly known as: FLOMAX Take 1 capsule (0.4 mg total) by mouth daily.        Allergies:  Allergies  Allergen Reactions   Amoxicillin Rash    Family History: Family History  Problem Relation Age of Onset   Hypertension Mother    Arrhythmia Mother    Heart disease Mother    Parkinson's disease Mother    Alzheimer's disease Mother    Deep vein thrombosis Father    Colon cancer Father    Sudden death Maternal Grandfather 5   ADD / ADHD Paternal Aunt    Deep vein thrombosis Paternal Aunt    Deep vein thrombosis Paternal Uncle     Social History:  reports that he has never smoked. He has never been exposed to tobacco smoke. He has never used smokeless tobacco. He reports that he does not drink alcohol and does not use drugs.   Physical Exam: BP 116/74   Pulse 64   Ht 6\' 3"  (1.905 m)   Wt 220 lb (99.8 kg)   BMI 27.50 kg/m   Constitutional:  Alert and oriented, No acute distress. HEENT: [place] AT, moist mucus membranes.  Trachea midline, no masses. Cardiovascular: No clubbing, cyanosis, or edema. Respiratory: Normal respiratory effort, no increased work of breathing.. Neurologic: Grossly intact, no focal deficits, moving all 4 extremities. Psychiatric: Normal mood and affect.  Laboratory Data: Lab Results  Component Value Date   WBC 6.2 01/06/2022   HGB 16.4 01/06/2022   HCT 48.1 01/06/2022   MCV 90 01/06/2022   PLT 252 01/06/2022    Lab Results  Component Value Date   CREATININE 0.82 01/06/2022    Lab Results  Component Value Date   HGBA1C  5.6 01/06/2022    Urinalysis UA today with 11-30 WBCs, greater than 30 RBCs, few bacteria, nitrate negative, 3+ glucose.   Assessment & Plan:    1. Benign prostatic hyperplasia with urinary obstruction Status post TURP primarily to address a median lobe component.  Initially some improvement with setback as below  Will have him continue his Flomax for the time being, reassess to wean up follow-up visit if he is improving.  Jardiance and 3+ glucose may be playing a role in his urinary symptoms - Urinalysis, Complete - Bladder Scan (Post Void Residual) in office - CULTURE, URINE COMPREHENSIVE  2. Acute cystitis without hematuria In light of fairly recent onset of worsening urinary symptoms and suspicion appearing urinalysis, will go ahead and treat him for presumed cystitis.  Urine culture sent and pending.  Bactrim DS twice daily for 7 days.  Plan to have him return in 6 weeks with PA for IPSS/PVR to reassess urinary symptoms.    01/08/2022, MD  [facility] [address] [place], [place] [phone]

## 2022-03-04 LAB — CULTURE, URINE COMPREHENSIVE

## 2022-03-08 ENCOUNTER — Encounter: Payer: Self-pay | Admitting: Urology

## 2022-03-08 MED ORDER — OXYBUTYNIN CHLORIDE 5 MG PO TABS: 5.0000 mg | ORAL_TABLET | Freq: Three times a day (TID) | ORAL | 0 refills | Status: DC | PRN

## 2022-03-08 NOTE — Telephone Encounter (Signed)
Should I schedule him to be seen?

## 2022-03-23 ENCOUNTER — Other Ambulatory Visit: Payer: Self-pay | Admitting: Internal Medicine

## 2022-03-23 NOTE — Telephone Encounter (Signed)
Please schedule overdue F/U appointment (8-10 week(s) per last AVS). Did not show for last scheduled telehealth visit. Thank you!

## 2022-04-12 ENCOUNTER — Encounter: Payer: Self-pay | Admitting: Physician Assistant

## 2022-04-12 ENCOUNTER — Ambulatory Visit: Payer: BC Managed Care – PPO | Admitting: Physician Assistant

## 2022-04-12 VITALS — BP 117/79 | HR 60 | Ht 75.0 in | Wt 220.0 lb

## 2022-04-12 DIAGNOSIS — N138 Other obstructive and reflux uropathy: Secondary | ICD-10-CM | POA: Diagnosis not present

## 2022-04-12 DIAGNOSIS — N401 Enlarged prostate with lower urinary tract symptoms: Secondary | ICD-10-CM

## 2022-04-12 LAB — BLADDER SCAN AMB NON-IMAGING

## 2022-04-12 LAB — URINALYSIS, COMPLETE
Bilirubin, UA: NEGATIVE
Ketones, UA: NEGATIVE
Leukocytes,UA: NEGATIVE
Nitrite, UA: NEGATIVE
Protein,UA: NEGATIVE
RBC, UA: NEGATIVE
Specific Gravity, UA: 1.02 (ref 1.005–1.030)
Urobilinogen, Ur: 1 mg/dL (ref 0.2–1.0)
pH, UA: 6.5 (ref 5.0–7.5)

## 2022-04-12 LAB — MICROSCOPIC EXAMINATION

## 2022-04-12 MED ORDER — OXYBUTYNIN CHLORIDE ER 10 MG PO TB24: 10.0000 mg | ORAL_TABLET | Freq: Every day | ORAL | 2 refills | Status: DC

## 2022-04-12 NOTE — Progress Notes (Signed)
04/12/2022 9:45 AM   [name] [birth year] [id number]  CC: Chief Complaint  Patient presents with   Follow-up    6 week follow-up   HPI: [name] is a 62 y.o. male with PMH BPH s/p TURP with Dr. Erlene Quan on 01/10/2022 and heart failure on Jardiance who presents today for symptom recheck after being treated for acute cystitis with Dr. Erlene Quan 6 weeks ago.   Today he reports urinary symptoms have progressively improved since last being seen in clinic.  He still has occasional urinary urgency and urge incontinence which responded well to oxybutynin.  He reports these episodes are less frequent than prior and he is no longer wearing any absorbent products for security.  He denies dysuria.  He denies constipation, dry mouth, and dry eye on oxybutynin.  He stopped Flomax about 2 weeks ago and did not notice a change in his urinary symptoms.  IPSS 12/mostly dissatisfied as below.  PVR 3 mL. In-office UA today positive for 3+ glucose; urine microscopy with moderate bacteria.    IPSS     Row Name 04/12/22 0900         International Prostate Symptom Score   How often have you had the sensation of not emptying your bladder? Less than 1 in 5     How often have you had to urinate less than every two hours? More than half the time     How often have you found you stopped and started again several times when you urinated? Less than 1 in 5 times     How often have you found it difficult to postpone urination? More than half the time     How often have you had a weak urinary stream? Not at All     How often have you had to strain to start urination? Not at All     How many times did you typically get up at night to urinate? 2 Times     Total IPSS Score 12       Quality of Life due to urinary symptoms   If you were to spend the rest of your life with your urinary condition just the way it is now how would you feel about that? Mostly Disatisfied               PMH: Past Medical  History:  Diagnosis Date   Anginal pain ([place])    Aortic atherosclerosis ([place])    Ascending aorta dilatation ([place])    a.) TTE 09/29/2020: Ao root 37 mm, asc Ao 39 mm, Ao arch 33 mm; b.) TTE 08/06/2021: Ao root 37 mm   CAD (coronary artery disease) 10/17/2013   a.) LHC 10/17/2013: EF 65%. 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) ([place])    a.) TTE 09/29/2020: EF 45-50%, sev bas-mid inferior HK, GLS -15.5%, mild MR, G1DD; b.) TTE 08/06/2021: EF 45-50%, no RWMAs, GLS -16.5%, mild MR, G2DD   History of 2019 novel coronavirus disease (COVID-19) 01/2020   Hypertension    Implantable loop recorder 12/09/2013   a.) Medtronic ILR device   NICM (nonischemic cardiomyopathy) ([place])    a.) TTE 10/08/2014: EF 50-55%; b.)  TTE 09/29/2020: EF 45-50%; c.) TTE 08/06/2021: EF 45-50%   Nonsustained ventricular tachycardia (HCC) 05/2014   a.) noted on ILR in 05/2014; b.) holter 10/2021: NSVT x 2 episodes; longest lasted 6 beats at a rate of 197 bpm   Pneumonia due to COVID-19 virus 01/2020   a.) hospitalized and Tx'd with MAB infusions   Prostatism    PSVT (paroxysmal supraventricular tachycardia)    a.) holter 10/2021: 148 SVT episodes with max HR 226 bpm   Sinus tachycardia    Syncope 10/16/2013    Surgical History: Past Surgical History:  Procedure Laterality Date   COLONOSCOPY WITH PROPOFOL N/A 11/24/2020   Procedure: COLONOSCOPY WITH PROPOFOL;  Surgeon: [name], MD;  Location[facility] ENDOSCOPY;  Service: Endoscopy;  Laterality: N/A;   LEFT HEART CATHETERIZATION WITH CORONARY ANGIOGRAM N/A 10/17/2013   Procedure: LEFT HEART CATHETERIZATION WITH CORONARY ANGIOGRAM;  Surgeon: Kathleene Hazel,  MD;  Location: [facility] CATH LAB;  Service: Cardiovascular;  Laterality: N/A;   LOOP RECORDER IMPLANT N/A 12/09/2013   Procedure: LOOP RECORDER IMPLANT;  Surgeon: Duke Salvia, MD;  Location: [facility] CATH LAB;  Service: Cardiovascular;  Laterality: N/A;   TRANSURETHRAL RESECTION OF PROSTATE N/A 01/10/2022   Procedure: TRANSURETHRAL RESECTION OF THE PROSTATE (TURP);  Surgeon: Vanna Scotland, MD;  Location: [facility] ORS;  Service: Urology;  Laterality: N/A;    Home Medications:  Allergies as of 04/12/2022       Reactions   Amoxicillin Rash        Medication List        Accurate as of April 12, 2022  9:45 AM. If you have any questions, ask your nurse or doctor.          STOP taking these medications    oxybutynin 5 MG tablet Commonly known as: DITROPAN Replaced by: oxybutynin 10 MG 24 hr tablet Stopped by: Carman Ching, PA-C   tamsulosin 0.4 MG Caps capsule Commonly known as: FLOMAX Stopped by: Carman Ching, PA-C       TAKE these medications    alendronate 70 MG tablet Commonly known as: FOSAMAX Take 1 tablet (70 mg total) by mouth every 7 (seven) days. Take with a full glass of water on an empty stomach.   aspirin 81 MG chewable tablet Chew 81 mg by mouth daily.   atorvastatin 40 MG tablet Commonly known as: LIPITOR TAKE 1 TABLET BY MOUTH ONCE DAILY   Entresto 24-26 MG Generic drug: sacubitril-valsartan TAKE 1 TABLET BY MOUTH TWICE DAILY   famotidine 20 MG tablet Commonly known as: PEPCID TAKE 1 TABLET BY MOUTH 1 HOUR BEFORE BEDTIME   Jardiance 10 MG Tabs tablet Generic drug: empagliflozin TAKE 1 TABLET BY MOUTH ONCE EVERY MORNING WITH BREAKFAST   metoprolol tartrate 25 MG tablet Commonly known as: LOPRESSOR Take 1 tablet (25 mg total) by mouth 2 (two) times daily.   omeprazole 20 MG capsule Commonly known as: PRILOSEC Take 1 capsule (20 mg total) by mouth 2 (two) times daily before a meal.   oxybutynin 10 MG 24 hr tablet Commonly known as:  DITROPAN-XL Take 1 tablet (10 mg total) by mouth daily. Replaces: oxybutynin 5 MG tablet Started by: Carman Ching, PA-C   spironolactone 25 MG tablet Commonly known as: ALDACTONE Take 0.5 tablets (12.5 mg total) by mouth daily.        Allergies:  Allergies  Allergen Reactions   Amoxicillin Rash    Family History: Family History  Problem Relation Age of Onset   Hypertension Mother  Arrhythmia Mother    Heart disease Mother    Parkinson's disease Mother    Alzheimer's disease Mother    Deep vein thrombosis Father    Colon cancer Father    Sudden death Maternal Grandfather 21   ADD / ADHD Paternal Aunt    Deep vein thrombosis Paternal Aunt    Deep vein thrombosis Paternal Uncle     Social History:   reports that he has never smoked. He has never been exposed to tobacco smoke. He has never used smokeless tobacco. He reports that he does not drink alcohol and does not use drugs.  Physical Exam: BP 117/79   Pulse 60   Ht 6\' 3"  (1.905 m)   Wt 220 lb (99.8 kg)   BMI 27.50 kg/m   Constitutional:  Alert and oriented, no acute distress, nontoxic appearing HEENT: [place], AT Cardiovascular: No clubbing, cyanosis, or edema Respiratory: Normal respiratory effort, no increased work of breathing Skin: No rashes, bruises or suspicious lesions Neurologic: Grossly intact, no focal deficits, moving all 4 extremities Psychiatric: Normal mood and affect  Laboratory Data: Results for orders placed or performed in visit on 04/12/22  Microscopic Examination   Urine  Result Value Ref Range   WBC, UA 0-5 0 - 5 /hpf   RBC, Urine 0-2 0 - 2 /hpf   Epithelial Cells (non renal) 0-10 0 - 10 /hpf   Bacteria, UA Moderate (A) None seen/Few  Urinalysis, Complete  Result Value Ref Range   Specific Gravity, UA 1.020 1.005 - 1.030   pH, UA 6.5 5.0 - 7.5   Color, UA Yellow Yellow   Appearance Ur Clear Clear   Leukocytes,UA Negative Negative   Protein,UA Negative Negative/Trace    Glucose, UA 3+ (A) Negative   Ketones, UA Negative Negative   RBC, UA Negative Negative   Bilirubin, UA Negative Negative   Urobilinogen, Ur 1.0 0.2 - 1.0 mg/dL   Nitrite, UA Negative Negative   Microscopic Examination See below:   BLADDER SCAN AMB NON-IMAGING  Result Value Ref Range   Scan Result 88ml    Assessment & Plan:   1. Benign prostatic hyperplasia with urinary obstruction Progressive improvement in urgency and urge incontinence since undergoing TURP with Dr. Erlene Quan 3 months ago.  His UA today has glucose urea consistent with Jardiance use and bacteriuria without pyuria, low suspicion for UTI at this time.  With progressive symptomatic improvement, I think his urinary symptoms are largely typical postop TURP symptoms and will continue to improve with time.  Okay to stay off Flomax.  We discussed continuing oxybutynin XL 10 mg daily for now with consideration of stopping this in several months to see if he still needs it.  He is in agreement with this plan. - Urinalysis, Complete - BLADDER SCAN AMB NON-IMAGING - CULTURE, URINE COMPREHENSIVE - oxybutynin (DITROPAN-XL) 10 MG 24 hr tablet; Take 1 tablet (10 mg total) by mouth daily.  Dispense: 30 tablet; Refill: 2  Return in 3 months (on 07/12/2022) for 84mth symptom recheck and PVR.  Debroah Loop, PA-C  [facility] [address], [place], [place] [address] [phone]

## 2022-04-15 LAB — CULTURE, URINE COMPREHENSIVE

## 2022-04-21 ENCOUNTER — Ambulatory Visit: Payer: BC Managed Care – PPO | Admitting: Internal Medicine

## 2022-05-11 ENCOUNTER — Other Ambulatory Visit: Payer: Self-pay | Admitting: Nurse Practitioner

## 2022-05-12 NOTE — Telephone Encounter (Signed)
Requested Prescriptions  Pending Prescriptions Disp Refills   omeprazole (PRILOSEC) 20 MG capsule [Pharmacy Med Name: OMEPRAZOLE DR 20 MG CAP] 180 capsule 1    Sig: TAKE 1 CAPSULE BY MOUTH TWICE DAILY BEFORE A MEAL     Gastroenterology: Proton Pump Inhibitors Passed - 05/11/2022  3:53 PM      Passed - Valid encounter within last 12 months    Recent Outpatient Visits           3 months ago Rib pain   [place], NP   4 months ago Encounter for annual physical exam   [facility] Mecum, Dani Gobble, PA-C   5 months ago Posterior pain of left hip   [place] [facility] Mecum, Erin E, PA-C   7 months ago Family history of blood coagulation disorder   [place] [facility] Vigg, Avanti, MD   1 year ago Need for influenza vaccination   [place] Vigg, Loman [place], MD       Future Appointments             In 2 weeks End, Harrell Gave, MD [place] [facility] [place]   In 2 months Debroah Loop, [place]

## 2022-05-24 ENCOUNTER — Encounter: Payer: Self-pay | Admitting: *Deleted

## 2022-05-24 DIAGNOSIS — Z006 Encounter for examination for normal comparison and control in clinical research program: Secondary | ICD-10-CM

## 2022-05-24 NOTE — Research (Signed)
Message left with some info about V1P research study. Encouraged him to call back with any questions.

## 2022-05-26 ENCOUNTER — Ambulatory Visit: Payer: BC Managed Care – PPO | Admitting: Internal Medicine

## 2022-06-06 ENCOUNTER — Telehealth: Payer: Self-pay | Admitting: Internal Medicine

## 2022-06-06 NOTE — Telephone Encounter (Signed)
Spoke with Sam at [facility] to authorize a 30 day supply of atorvastatin 40 mg. Advised pharmacist that patient will need to schedule a F/U appt with Dr. [name] for further refills.

## 2022-06-06 NOTE — Telephone Encounter (Signed)
*  STAT* If patient is at the pharmacy, call can be transferred to refill team.   1. Which medications need to be refilled? (please list name of each medication and dose if known)   atorvastatin (LIPITOR) 40 MG tablet   2. Which pharmacy/location (including street and city if local pharmacy) is medication to be sent to?  [place] they need a 30 day or 90 day supply?   30 day  Patient stated he is completely out of this medication.  Patient requested refill prescription be faxed to [facility] as they told him their e-script had been hacked.

## 2022-06-06 NOTE — Telephone Encounter (Signed)
Please schedule overdue F/U appt with Dr. Klein. Thank you! 

## 2022-07-04 ENCOUNTER — Other Ambulatory Visit: Payer: Self-pay | Admitting: Physician Assistant

## 2022-07-04 ENCOUNTER — Other Ambulatory Visit: Payer: Self-pay | Admitting: Internal Medicine

## 2022-07-04 DIAGNOSIS — N401 Enlarged prostate with lower urinary tract symptoms: Secondary | ICD-10-CM

## 2022-07-04 DIAGNOSIS — N138 Other obstructive and reflux uropathy: Secondary | ICD-10-CM

## 2022-07-12 ENCOUNTER — Ambulatory Visit: Payer: BC Managed Care – PPO | Admitting: Physician Assistant

## 2022-07-12 ENCOUNTER — Encounter: Payer: Self-pay | Admitting: Physician Assistant

## 2022-07-12 VITALS — BP 123/78 | HR 64 | Ht 75.0 in | Wt 201.0 lb

## 2022-07-12 DIAGNOSIS — N401 Enlarged prostate with lower urinary tract symptoms: Secondary | ICD-10-CM

## 2022-07-12 DIAGNOSIS — N138 Other obstructive and reflux uropathy: Secondary | ICD-10-CM | POA: Diagnosis not present

## 2022-07-12 DIAGNOSIS — R300 Dysuria: Secondary | ICD-10-CM | POA: Diagnosis not present

## 2022-07-12 LAB — URINALYSIS, COMPLETE
Bilirubin, UA: NEGATIVE
Ketones, UA: NEGATIVE
Leukocytes,UA: NEGATIVE
Nitrite, UA: NEGATIVE
RBC, UA: NEGATIVE
Specific Gravity, UA: 1.015 (ref 1.005–1.030)
Urobilinogen, Ur: 0.2 mg/dL (ref 0.2–1.0)
pH, UA: 6.5 (ref 5.0–7.5)

## 2022-07-12 LAB — MICROSCOPIC EXAMINATION

## 2022-07-12 LAB — BLADDER SCAN AMB NON-IMAGING: Scan Result: 0

## 2022-07-12 MED ORDER — OXYBUTYNIN CHLORIDE ER 10 MG PO TB24: 10.0000 mg | ORAL_TABLET | Freq: Every day | ORAL | 2 refills | Status: DC

## 2022-07-12 NOTE — Progress Notes (Signed)
07/12/2022 10:03 AM   [name] 1960-11-10 [id number]  CC: Chief Complaint  Patient presents with   Benign Prostatic Hypertrophy   HPI: [name] is a 62 y.o. male with PMH BPH s/p TURP with Dr. Erlene Quan in October 2023 and heart failure on Jardiance who presents today for symptom recheck on oxybutynin XL 10 mg daily.   Today he reports his urgency is well-managed on oxybutynin and he wishes to continue this.  He denies constipation, dry mouth, or dry eye.  Additionally, he reports a 1 week history of dysuria without fever, chills, nausea, or vomiting.  PVR 0 mL.  PMH: Past Medical History:  Diagnosis Date   Anginal pain    Aortic atherosclerosis    Ascending aorta dilatation    a.) TTE 09/29/2020: Ao root 37 mm, asc Ao 39 mm, Ao arch 33 mm; b.) TTE 08/06/2021: Ao root 37 mm   CAD (coronary artery disease) 10/17/2013   a.) LHC 10/17/2013: EF 65%. 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)    a.) TTE 09/29/2020: EF 45-50%, sev bas-mid inferior HK, GLS -15.5%, mild MR, G1DD; b.) TTE 08/06/2021: EF 45-50%, no RWMAs, GLS -16.5%, mild MR, G2DD   History of 2019 novel coronavirus disease (COVID-19) 01/2020   Hypertension    Implantable loop recorder 12/09/2013   a.) Medtronic ILR device   NICM (nonischemic cardiomyopathy)    a.) TTE 10/08/2014: EF 50-55%; b.) TTE 09/29/2020: EF 45-50%; c.) TTE 08/06/2021: EF 45-50%   Nonsustained ventricular tachycardia 05/2014   a.) noted on ILR in 05/2014; b.) holter 10/2021: NSVT x 2 episodes; longest lasted 6 beats at a rate of 197 bpm   Pneumonia due to COVID-19 virus 01/2020   a.) hospitalized and Tx'd with MAB infusions   Prostatism     PSVT (paroxysmal supraventricular tachycardia)    a.) holter 10/2021: 148 SVT episodes with max HR 226 bpm   Sinus tachycardia    Syncope 10/16/2013    Surgical History: Past Surgical History:  Procedure Laterality Date   COLONOSCOPY WITH PROPOFOL N/A 11/24/2020   Procedure: COLONOSCOPY WITH PROPOFOL;  Surgeon: Lucilla Lame, MD;  Location[facility] ENDOSCOPY;  Service: Endoscopy;  Laterality: N/A;   LEFT HEART CATHETERIZATION WITH CORONARY ANGIOGRAM N/A 10/17/2013   Procedure: LEFT HEART CATHETERIZATION WITH CORONARY ANGIOGRAM;  Surgeon: Burnell Blanks, MD;  Location: [facility] CATH LAB;  Service: Cardiovascular;  Laterality: N/A;   LOOP RECORDER IMPLANT N/A 12/09/2013   Procedure: LOOP RECORDER IMPLANT;  Surgeon: Deboraha Sprang, MD;  Location: [facility] CATH LAB;  Service: Cardiovascular;  Laterality: N/A;   TRANSURETHRAL RESECTION OF PROSTATE N/A 01/10/2022   Procedure: TRANSURETHRAL RESECTION OF THE PROSTATE (TURP);  Surgeon: Hollice Espy, MD;  Location: [facility] ORS;  Service: Urology;  Laterality: N/A;    Home Medications:  Allergies as of 07/12/2022       Reactions   Amoxicillin Rash        Medication List        Accurate as of July 12, 2022 10:03 AM. If you have any questions, ask your nurse or doctor.          alendronate 70 MG tablet Commonly known  as: FOSAMAX Take 1 tablet (70 mg total) by mouth every 7 (seven) days. Take with a full glass of water on an empty stomach.   aspirin 81 MG chewable tablet Chew 81 mg by mouth daily.   atorvastatin 40 MG tablet Commonly known as: LIPITOR Take 1 tablet (40 mg total) by mouth daily.   Entresto 24-26 MG Generic drug: sacubitril-valsartan TAKE 1 TABLET BY MOUTH TWICE DAILY   famotidine 20 MG tablet Commonly known as: PEPCID TAKE 1 TABLET BY MOUTH 1 HOUR BEFORE BEDTIME   Jardiance 10 MG Tabs tablet Generic drug: empagliflozin TAKE 1 TABLET BY MOUTH ONCE EVERY MORNING WITH BREAKFAST   metoprolol tartrate 25 MG  tablet Commonly known as: LOPRESSOR Take 1 tablet (25 mg total) by mouth 2 (two) times daily.   omeprazole 20 MG capsule Commonly known as: PRILOSEC TAKE 1 CAPSULE BY MOUTH TWICE DAILY BEFORE A MEAL   oxybutynin 10 MG 24 hr tablet Commonly known as: DITROPAN-XL Take 1 tablet (10 mg total) by mouth daily.   spironolactone 25 MG tablet Commonly known as: ALDACTONE Take 0.5 tablets (12.5 mg total) by mouth daily.        Allergies:  Allergies  Allergen Reactions   Amoxicillin Rash    Family History: Family History  Problem Relation Age of Onset   Hypertension Mother    Arrhythmia Mother    Heart disease Mother    Parkinson's disease Mother    Alzheimer's disease Mother    Deep vein thrombosis Father    Colon cancer Father    Sudden death Maternal Grandfather 25   ADD / ADHD Paternal Aunt    Deep vein thrombosis Paternal Aunt    Deep vein thrombosis Paternal Uncle     Social History:   reports that he has never smoked. He has never been exposed to tobacco smoke. He has never used smokeless tobacco. He reports that he does not drink alcohol and does not use drugs.  Physical Exam: BP 123/78   Pulse 64   Ht 6\' 3"  (1.905 m)   Wt 201 lb (91.2 kg)   BMI 25.12 kg/m   Constitutional:  Alert and oriented, no acute distress, nontoxic appearing HEENT: [place], AT Cardiovascular: No clubbing, cyanosis, or edema Respiratory: Normal respiratory effort, no increased work of breathing Skin: No rashes, bruises or suspicious lesions Neurologic: Grossly intact, no focal deficits, moving all 4 extremities Psychiatric: Normal mood and affect  Laboratory Data: Results for orders placed or performed in visit on 07/12/22  Bladder Scan (Post Void Residual) in office  Result Value Ref Range   Scan Result 0    Assessment & Plan:   1. Benign prostatic hyperplasia with urinary obstruction Postop urinary urgency well-managed on oxybutynin, will plan to continue this for a while longer per  patient preference.  We discussed that ultimately I recommend a drug holiday, as his symptoms may have naturally resolved in the postoperative period such that he no longer requires this medication.  Will reconsider this on future follow-up. - Bladder Scan (Post Void Residual) in office - oxybutynin (DITROPAN-XL) 10 MG 24 hr tablet; Take 1 tablet (10 mg total) by mouth daily.  Dispense: 30 tablet; Refill: 2  2. Dysuria 1 week of dysuria and this patient on Jardiance.  He will provide a urine sample today prior to departing clinic and we will call him with his results. - Urinalysis, Complete  Return in about 6 months (around 01/11/2023) for Follow up with IPSS, UA, PVR, Will  call with results.  Debroah Loop, PA-C  [facility] [place] [address] [place], [place] [address] [phone]

## 2022-07-19 ENCOUNTER — Encounter: Payer: Self-pay | Admitting: *Deleted

## 2022-07-19 DIAGNOSIS — Z006 Encounter for examination for normal comparison and control in clinical research program: Secondary | ICD-10-CM

## 2022-07-19 NOTE — Research (Signed)
Message left with info about V1P. Encouraged him to call with any questions, or if interested.

## 2022-07-20 ENCOUNTER — Encounter: Payer: Self-pay | Admitting: *Deleted

## 2022-07-20 DIAGNOSIS — Z006 Encounter for examination for normal comparison and control in clinical research program: Secondary | ICD-10-CM

## 2022-07-20 NOTE — Research (Signed)
Mr Consolo called back and requested more information about V1P research study. Emailed a copy of the consent to read over and encouraged him to call with any  questions.

## 2022-07-20 NOTE — Research (Signed)
Spoke with [name] about V1P Research.  He is interested in coming in for screening. Scheduled him for April 23 at 0800.

## 2022-07-28 ENCOUNTER — Ambulatory Visit: Payer: BC Managed Care – PPO | Admitting: Internal Medicine

## 2022-08-02 ENCOUNTER — Encounter: Payer: BC Managed Care – PPO | Admitting: *Deleted

## 2022-08-02 VITALS — BP 93/70 | HR 55 | Resp 14 | Ht 75.0 in | Wt 200.0 lb

## 2022-08-02 DIAGNOSIS — Z006 Encounter for examination for normal comparison and control in clinical research program: Secondary | ICD-10-CM

## 2022-08-02 NOTE — Research (Signed)
V1P Informed Consent   Subject Name: [name]  Subject met inclusion and exclusion criteria.  The informed consent form, study requirements and expectations were reviewed with the subject and questions and concerns were addressed prior to the signing of the consent form.  The subject verbalized understanding of the trial requirements.  The subject agreed to participate in the V1P trial and signed the informed consent on 08/02/2022.  The informed consent was obtained prior to performance of any protocol-specific procedures for the subject.  A copy of the signed informed consent was given to the subject and a copy was placed in the subject's medical record.   [place] [facility]   Inclusion  YES NO   1-Written informed consent must be obtained before any assessment is performed       2-Male or male ?40 but <64 years of age       3-At an increased risk for a first MACE (i.e., no prior major ASCVD event), defined as any one of the following       a. Evidence of atherosclerotic coronary artery disease (CAD) on computer tomography (CT) or invasive coronary angiogram defined as a coronary artery stenosis ?20% but <50% in the left main coronary artery or stenosis ?20% but <70% in any other major epicardial coronary artery, or       b. Coronary artery calcium (CAC) score obtained by CT-scan ?100 Agatston units, or    _108_ 08/09/2024_    c. High 10-year ASCVD risk ?20%, or       d. Intermediate 10-year ASCVD risk 7.5% - <20% with at least 2 risk-enhancing factors       If on a background LLT, the dose should be stable for at least 4 weeks prior to the screening visit and the participant should be willing to remain on this background therapy for the entire duration of the study.       LDL-C ?70 mg/dL (?1.61 mmol/L) but <096 mg/dL (<0.45 mmol/L) at the screening visit.        Exclusion     History of major ASCVD event defined as any one of the  following       a. Acute coronary syndrome (ACS) in the 12 months prior to randomization, or       b. Prior myocardial infarction at any time prior to randomization       c. Prior ischemic stroke at any time prior to randomization       d. Symptomatic peripheral artery disease (PAD) as evidenced by either intermittent claudication, previous revascularization, or amputation due to atherosclerotic disease at any time prior to randomization.       History of, or planned, ischemia-driven revascularization in a coronary or extracoronary arterial bed prior to randomization       Absence of coronary atherosclerosis on a CT angiogram or an invasive coronary angiogram in the 2 years prior to randomization       Coronary artery calcium (CAC) score of 0 obtained in the 2 years prior to randomization       Active liver disease or hepatic dysfunction       Previous, current, or planned treatment with a monoclonal antibody (mAb) directed toward proprotein convertase subtilisin/kexin type 9 (PCSK9) (e.g., evolocumab, alirocumab)       Pregnant or nursing (lactating) women       Women of childbearing potential unless they are using effective methods of contraception while taking study treatment which includes for 6 months after  last study drug administration        VP1 Screening Visit  Subject number: [id number] Date: 08/02/2022 Informed consent signed   ICF signed on 08/02/2022  Inclusion and exclusion criteria reviewed  Patient demography reviewed  Smoking history reviewed   Never  Former  Current Every Day  Current Some Days   Past and current medical history reviewed  Surgical history reviewed  Prior or concomitant medications reviewed  Lipid lowering medications reviewed   Physical Exam performed by PI or SUB-I   Dr Riley Kill   Vital signs taken at : Vitals:   08/02/22 0758  08/02/22 0801 08/02/22 0805  BP: 104/76 107/66 93/70  Pulse: (!) 55 (!) 55 (!) 55  Resp: 14    Height:  (1.905 m)    Weight:   200 lb (90.7 kg)  BMI (Calculated):   25    Waist circumference:  94.5 cm   Mean BP: 101/71   Mean Pulse: 55  Labs collected at:  include: Serum pregnancy  Fasting Lipid Profile  Broad Clinical Chemistry  Hematology panel  Urinalysis   Genetic Consent obtained   AE/SAE assessment completed    Current Outpatient Medications:    alendronate (FOSAMAX) 70 MG tablet, Take 1 tablet (70 mg total) by mouth every 7 (seven) days. Take with a full glass of water on an empty stomach., Disp: 12 tablet, Rfl: 3   aspirin 81 MG chewable tablet, Chew 81 mg by mouth daily., Disp: , Rfl:    atorvastatin (LIPITOR) 40 MG tablet, Take 1 tablet (40 mg total) by mouth daily., Disp: 30 tablet, Rfl: 0   empagliflozin (JARDIANCE) 10 MG TABS tablet, TAKE 1 TABLET BY MOUTH ONCE EVERY MORNING WITH BREAKFAST, Disp: 30 tablet, Rfl: 11   ENTRESTO 24-26 MG, TAKE 1 TABLET BY MOUTH TWICE DAILY, Disp: 180 tablet, Rfl: 3   famotidine (PEPCID) 20 MG tablet, TAKE 1 TABLET BY MOUTH 1 HOUR BEFORE BEDTIME, Disp: 30 tablet, Rfl: 11   metoprolol tartrate (LOPRESSOR) 25 MG tablet, Take 1 tablet (25 mg total) by mouth 2 (two) times daily., Disp: 60 tablet, Rfl: 6   omeprazole (PRILOSEC) 20 MG capsule, TAKE 1 CAPSULE BY MOUTH TWICE DAILY BEFORE A MEAL, Disp: 180 capsule, Rfl: 1   oxybutynin (DITROPAN-XL) 10 MG 24 hr tablet, Take 1 tablet (10 mg total) by mouth daily., Disp: 30 tablet, Rfl: 2   spironolactone (ALDACTONE) 25 MG tablet, Take 0.5 tablets (12.5 mg total) by mouth daily., Disp: 45 tablet, Rfl: 3

## 2022-08-02 NOTE — Progress Notes (Signed)
Patient seen today in screening for V1-P trial.  He is a very pleasant gentleman followed by Dr. Odessa Fleming and followed in our [place].  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  He has had a calcium score of just over 100, and non obstructive LMCA plaque.  He is on high intensity statin therapy with LDL100mg /dl on atorvastatin  dating back to fall 2023.  He is in for V1-P screening.  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   Denies chest pain.   Alert, oriented male in NAD BP 93/70  P55 R 16 unlabored T afebrile  No JVD No carotid bruits Lungs clear to auscultation and percussion Ext no edema.  Abd -benign No focal obvious neuro deficits   Imp   Non ischemic CM with mid range EF  Hyperlipidemia  Elevated calcium score  Plan   Screening labs for V1-P   Arturo Morton. Riley Kill, MD, [facility] Medical Director, [facility]

## 2022-08-04 ENCOUNTER — Other Ambulatory Visit: Payer: Self-pay | Admitting: Internal Medicine

## 2022-08-04 ENCOUNTER — Telehealth: Payer: Self-pay | Admitting: Internal Medicine

## 2022-08-04 MED ORDER — METOPROLOL TARTRATE 25 MG PO TABS: 25.0000 mg | ORAL_TABLET | Freq: Two times a day (BID) | ORAL | 11 refills | Status: DC

## 2022-08-04 NOTE — Telephone Encounter (Signed)
Returned call to patient.  Patient is requesting clarification on metoprolol dosage. His Rx bottle direction reads metoprolol tartrate  take 0.5 tablet by mouth twice daily. He has been running out of his Rx early because he thought he was supposed to be taking 1 whole tablet twice daily.  Per last OV note with Dr. Graciela Husbands on 11/04/21: Event recorder demonstrated multiple episodes of SVT which are an atrial tachycardia, also some nonsustained VT.  Symptoms are associated with tachycardia and we could presume with some trepidation that it is SVT that is triggering it.  For right now we will proceed on that hypothesis, he was started on metoprolol which will increase from 12.5--25 twice daily.   Patient is to take metoprolol tartrate --1 tablet twice daily. Patient verbalized understanding.  Called and spoke with pharmacist at [facility] to verify Rx on file: confirmed they have patient to take 0.5 tablet BID. Resent Rx with updated dosage to [facility].

## 2022-08-04 NOTE — Telephone Encounter (Signed)
Pt c/o medication issue:  1. Name of Medication: metoprolol tartrate (LOPRESSOR) 25 MG tablet   2. How are you currently taking this medication (dosage and times per day)? As prescribed   3. Are you having a reaction (difficulty breathing--STAT)? No  4. What is your medication issue? Patient states that after looking at bottle, he noticed that the instructions were different than he was told to take and he would like a call back for clarification. Please advise.

## 2022-08-04 NOTE — Research (Addendum)
Are there any labs that are clinically significant?  Yes []  OR No[x]   Is the patient eligible to continue enrollment in the study after screening visit?  Yes [x]   OR No[]    ACCESSION NO. [id number]                                             Page 1 of 1                        [id number]                   INVESTIGATOR: ([id number])                          PROTOCOL   [id number]                     Shawnie Pons M.D.                            INVESTIGATOR NO.: [id number]                     c/o Blair Promise                           PATIENT NUMBER: [id number]                     [facility]                  SUBJECT INITIALS NOT COLLECTED:                     [address]                          VISIT: SCRN                     [place],  [place] [address] Screening                   SPONSOR REPORT TO:                 COLLECTION TIME:08:40 DATE:02-Aug-2022                     Catrin Deck                      DATE RECEIVED IN LABORATORY: 03-Aug-2022                     c/o Theodosia Blender              DATE REPORTED BY LABORATORY: 03-Aug-2022                     [employer]                         SEX: M  BIRTHDATE:  11-Apr-1960    AGE: Marin Comment                     [facility]  [address], [place] [address]                                                                                        Ref. Ranges               Clinical    Comments                                                                          Significance                                                                            Yes*  No                    IS SUBJECT FASTING?                      Fasting?       Yes                                                    IS SUBJECT OF BLACK RACE?                      black?         No                                                      FASTING PLASMA GLUCOSE                      Gluc. Fast     102     H    70-100 mg/dL                       EGFR BY MDRD, ENZYMATIC                      GFR MDRD       91           mL/min/1.56m2  No Ref Rng               CHEMISTRY PANEL                      Total Bili     0.4          0.2-1.2 mg/dL                                Alk Phos       77           40-129 U/L                                   ALT (SGPT)     6            5-48 U/L                                    AST (SGOT)     12           8-40 U/L                                    CK             52           39-308 U/L                                   Sodium         138          135-145 mEq/L                                Potassium      4.2          3.5-5.2 mEq/L                                Chloride       106          94-112 mEq/L                               HEMATOLOGY&DIFFERENTIAL PANEL                      HGB            15.1         12.5-17.0 g/dL                              HCT            46           37-51 %  RBC            4.9          4.0-5.8 x106/uL                              MCH            31           26-34 pg                                    MCHC           33           31-38 g/dL                                   MCV            94           80-100 fL                                    WBC            5.87         3.80-10.70 x103/uL                           Neutrophil     72.0         40.5-75.0 %                                 Lymphocyte     19.4         15.4-48.5%                                   Monocytes      6.4          2.6-10.1 %                                   Eosinophil     1.7          0.0-6.8 %                                    Basophils      0.5          0.0-2.0 %                                    Platelets      243          130-394 x103/u         URINE MACRO PANEL                      Spec Grav      1.039   H    1.006-1.030  pH             6.0           5.0-8.0                                      Protein        Negative      Ref ZOX:WRUEAVWU                           Glucose        +4      H    Ref JWJ:XBJYNW                              Ketones        Negative      Ref GNF:AOZHYQMV                           Bilirubin      Negative      Ref HQI:ONGEXBMW                           Urobilin       Normal       Ref UXL:KGMWNU                              Blood          Negative      Ref UVO:ZDGUYQIH                           Nitrite        Negative      Ref KVQ:QVZDGLOV                           Leuk Est       Negative      Ref FIE:PPIRJJOA                         LIPID PANEL                      HDLC4          46           40-59 mg/dL                                 Triglycer      57      L    58-260 mg/dL                                 Cholest        165     L    175-298 mg/dL                                LDL Calc       108  83-210 mg/dL                                 VLDL-C         11           7-34 mg/dL                                 NON HDL CHOLESTEROL                      Non HDL ch     119          mg/dL No Ref Rng                           CREATININE                      Enz Creat      0.85         0.74-1.35 mg/dL          ZOX0R                      Fast HbA1c     5.3          <6.5%

## 2022-08-05 ENCOUNTER — Encounter: Payer: Self-pay | Admitting: *Deleted

## 2022-08-10 ENCOUNTER — Encounter: Payer: Self-pay | Admitting: *Deleted

## 2022-08-10 DIAGNOSIS — Z006 Encounter for examination for normal comparison and control in clinical research program: Secondary | ICD-10-CM

## 2022-08-10 NOTE — Research (Signed)
Message left to remind [name] of his appointment tomorrow, reminded to be NPO and gave him the parking code.

## 2022-08-11 ENCOUNTER — Other Ambulatory Visit: Payer: Self-pay

## 2022-08-11 ENCOUNTER — Encounter: Payer: BC Managed Care – PPO | Admitting: *Deleted

## 2022-08-11 VITALS — BP 100/71 | HR 58 | Resp 18

## 2022-08-11 DIAGNOSIS — Z006 Encounter for examination for normal comparison and control in clinical research program: Secondary | ICD-10-CM

## 2022-08-11 MED ORDER — STUDY - VICTORION-1 PREVENT - INCLISIRAN 300 MG/1.5 ML OR PLACEBO SQ INJECTION (PI-STUCKEY)
300.0000 mg | INJECTION | SUBCUTANEOUS | Status: DC
  Administered 2022-08-11: 300 mg via SUBCUTANEOUS
  Filled 2022-08-11: qty 1.5

## 2022-08-11 NOTE — Research (Addendum)
[name] [name]  28-Apr-1960 [id number]  Date: 11-Aug-2022  Subject ID: [id number] Kit #: [id number] Administration Site:  right lower abd. At 0955 Tol well   Inclusion and exclusion criteria reviewed [x]   Prior or concomitant medications reviewed [x]   Lipid lowering medications reviewed [x]   Surgical history reviewed [x]    TIME: BP: PULSE:  0929 98/61 57  0933 98/60 57  0936 100/71 58   Mean BP:98/64    Mean Pulse: 57 Weight: 198 bs./ 89.8kg  Lifestyle instructions reviewed [x]   Labs collected at: 0942   Fasting Lipid Profile [x]  Fasting Lp(a) [x]  Liver function tests [x]  hsCRP [x]  Genetic testing [x]  Exploratory biomarkers []   AE/SAE assessment completed [x]   Randomization completed [x]   Study drug administered [x]        Patient seen in clinic for V-1 Prevent study [name] . Patient denies abdominal pain, nausea/vomiting. Denies any ED or urgent care visits since last seen in research clinic. Denies signs/symptoms of pancreatitis. Denies other pain. Vital signs obtained at 0929, 0933, and 0936 while patient was in seated position. Mean seated BP 98/64. Mean seated pulse rate 57. Labs drawn at [id number] Past and current medical history reviewed. Surgical history reviewed. Medications reviewed with patient during [name], denies changes. Scheduled next [name] for July 30 at 0930

## 2022-08-15 DIAGNOSIS — R42 Dizziness and giddiness: Secondary | ICD-10-CM | POA: Insufficient documentation

## 2022-08-15 NOTE — Research (Addendum)
Are there any labs that are clinically significant?  Yes []  OR No[x]   ACCESSION NO. [id number]                                             Page 1 of 1                        [id number]                   INVESTIGATOR: ([id number])                          PROTOCOL   [id number]                     Shawnie Pons M.D.                            INVESTIGATOR NO.: [id number]                     c/o Blair Promise                           PATIENT NUMBER: [id number]                     [facility]                  SUBJECT INITIALS NOT COLLECTED:                     [address]                          VISIT: V1                     [place],  [place] [address] Baseline V1                   SPONSOR REPORT TO:                 COLLECTION TIME:09:42 DATE:11-Aug-2022                     Catrin Deck                      DATE RECEIVED IN LABORATORY: 12-Aug-2022                     c/o Theodosia Blender              DATE REPORTED BY LABORATORY: 12-Aug-2022                     [employer]                         SEX: M  BIRTHDATE:  11-Apr-1960    AGE: 62y                     [facility] [address], [place] [address]  Ref. Ranges               Clinical    Comments                                                                          Significance                                                                            Yes*  No                    IS SUBJECT FASTING?                      Fasting?       Yes                                 HS-CRP                      CRPHS          0.07         0.00-0.50 mg/dL  LIPID PANEL                      HDLC4          46           40-59 mg/dL                                 Triglycer        60           58-260 mg/dL                                 Cholest        147     L    175-298 mg/dL                                LDL Calc        89           83-210 mg/dL                                 VLDL-C         12           7-34 mg/dL                                 NON HDL CHOLESTEROL  Non HDL ch     101          mg/dL No Ref Rng

## 2022-08-18 ENCOUNTER — Ambulatory Visit: Payer: BC Managed Care – PPO | Admitting: Internal Medicine

## 2022-08-18 DIAGNOSIS — R42 Dizziness and giddiness: Secondary | ICD-10-CM

## 2022-08-18 DIAGNOSIS — I471 Supraventricular tachycardia, unspecified: Secondary | ICD-10-CM

## 2022-08-18 DIAGNOSIS — I5022 Chronic systolic (congestive) heart failure: Secondary | ICD-10-CM

## 2022-09-06 ENCOUNTER — Other Ambulatory Visit: Payer: Self-pay | Admitting: Medical

## 2022-11-02 ENCOUNTER — Ambulatory Visit: Payer: BC Managed Care – PPO | Attending: Internal Medicine | Admitting: Internal Medicine

## 2022-11-02 NOTE — Progress Notes (Deleted)
  Cardiology Office Note:  .   Date:  11/02/2022  ID:  [name], DOB 1960-11-13, MRN [id number] PCP: Larae Grooms, NP  [facility] Providers Cardiologist:  Berton Mount, MD  History of Present Illness: .   [name] is a 62 y.o. male with history of nonobstructive coronary artery disease, cardiomyopathy, hyperlipidemia, and syncope status post implantable loop recorder (has shown atrial tachycardia).  He has previously been followed in our practice by Dr. Graciela Husbands.  I am seeing him today for the first time.  He was last seen in our office a year ago by Dr. Graciela Husbands and Cadence Fransico Michael, [place], at which time metoprolol was increased by Dr. Graciela Husbands.  Follow-up in 2 to 4 months was recommended, though he was a no-show for his virtual visit with Dr. Graciela Husbands in 12/2021  ROS: See HPI  Studies Reviewed: .        *** Risk Assessment/Calculations:   {Does this patient have ATRIAL FIBRILLATION?:[phone]} No BP recorded.  {Refresh Note OR Click here to enter BP  :1}***       Physical Exam:   VS:  There were no vitals taken for this visit.   Wt Readings from Last 3 Encounters:  08/02/22 200 lb (90.7 kg)  07/12/22 201 lb (91.2 kg)  04/12/22 220 lb (99.8 kg)    General:  NAD. Neck: No JVD or HJR. Lungs: Clear to auscultation bilaterally without wheezes or crackles. Heart: Regular rate and rhythm without murmurs, rubs, or gallops. Abdomen: Soft, nontender, nondistended. Extremities: No lower extremity edema.  ASSESSMENT AND PLAN: .    ***    {Are you ordering a CV Procedure (e.g. stress test, cath, DCCV, TEE, etc)?   Press F2        :[id number]}  Dispo: ***  Signed, [name], MD

## 2022-11-03 ENCOUNTER — Encounter: Payer: Self-pay | Admitting: Internal Medicine

## 2022-11-08 ENCOUNTER — Other Ambulatory Visit: Payer: Self-pay

## 2022-11-08 ENCOUNTER — Other Ambulatory Visit: Payer: Self-pay | Admitting: Internal Medicine

## 2022-11-08 ENCOUNTER — Encounter: Payer: BC Managed Care – PPO | Admitting: *Deleted

## 2022-11-08 DIAGNOSIS — Z006 Encounter for examination for normal comparison and control in clinical research program: Secondary | ICD-10-CM

## 2022-11-08 MED ORDER — STUDY - VICTORION-1 PREVENT - INCLISIRAN 300 MG/1.5 ML OR PLACEBO SQ INJECTION (PI-STUCKEY)
300.0000 mg | INJECTION | SUBCUTANEOUS | Status: DC
  Administered 2022-11-08: 300 mg via SUBCUTANEOUS
  Filled 2022-11-08: qty 1.5

## 2022-11-08 NOTE — Research (Addendum)
V1P Informed Consent Re consent   Protocol No. [id number] ICF Version number: 01.01.04     Subject met inclusion and exclusion criteria.  The informed consent form, study requirements and expectations were reviewed with the subject and questions and concerns were addressed prior to the signing of the consent form.  The subject verbalized understanding of the trial requirements.  The subject agreed to participate in the Victorion 1 Prevent trial and signed the informed consent on 11-08-22 at 0947 .  Informed consent was obtained prior to performance of any protocol-specific procedures. A copy of the signed informed consent was given to the subject and original copy was placed in the subject's medical record.    DATE: 08-November-2022  SUBJECT ID: [id number]   Visit:2         Day: 90          Prior or concomitant medications reviewed [x]   Lipid lowering medications reviewed [x]   Surgical history reviewed [x]   TIME: BP: PULSE:  0935 95/57 59  0941 94/54 57  0945 90/57 57  Mean BP: 93/56   Mean Pulse: 57  Labs collected at: 0953 Fasting Lipid Profile [x]  Fasting Lp(a) [x]  Liver function tests []  Exploratory biomarkers []   AE/SAE assessment completed [x]   Study drug administered [x]   Endpoint assessment completed [x]   [name] is here for V2 of V1P research. He reports no A/e's, no changes in his meds,, and no visits to the Ed or urgent care since seen last. Wt was 185 lbs. Scheduled next visit for Jan 27 at 0800. Injection was given in right lower abd at 0959 Kit number [id number] tol well.   Current Outpatient Medications:    alendronate (FOSAMAX) 70 MG tablet, Take 1 tablet (70 mg total) by mouth every 7 (seven) days. Take with a full glass of water on an empty stomach., Disp: 12 tablet, Rfl: 3   aspirin 81 MG chewable tablet, Chew 81 mg by mouth daily., Disp: , Rfl:    atorvastatin (LIPITOR) 40 MG tablet, Take 1 tablet (40 mg total) by mouth daily. Please keep upcoming  appointment in May for future refills. Thank you., Disp: 30 tablet, Rfl: 0   empagliflozin (JARDIANCE) 10 MG TABS tablet, TAKE 1 TABLET BY MOUTH ONCE EVERY MORNING WITH BREAKFAST, Disp: 30 tablet, Rfl: 11   ENTRESTO 24-26 MG, TAKE 1 TABLET BY MOUTH TWICE DAILY, Disp: 180 tablet, Rfl: 3   famotidine (PEPCID) 20 MG tablet, TAKE 1 TABLET BY MOUTH 1 HOUR BEFORE BEDTIME, Disp: 30 tablet, Rfl: 11   metoprolol tartrate (LOPRESSOR) 25 MG tablet, Take 1 tablet (25 mg total) by mouth 2 (two) times daily., Disp: 60 tablet, Rfl: 11   omeprazole (PRILOSEC) 20 MG capsule, TAKE 1 CAPSULE BY MOUTH TWICE DAILY BEFORE A MEAL, Disp: 180 capsule, Rfl: 1   oxybutynin (DITROPAN-XL) 10 MG 24 hr tablet, Take 1 tablet (10 mg total) by mouth daily., Disp: 30 tablet, Rfl: 2   spironolactone (ALDACTONE) 25 MG tablet, Take 0.5 tablets (12.5 mg total) by mouth daily. KEEP OV., Disp: 45 tablet, Rfl: 0  Current Facility-Administered Medications:    Study - VICTORION-1 PREVENT - inclisiran 300 mg/1.84mL or placebo SQ injection (PI-Stuckey), 300 mg, Subcutaneous, Q6 months,

## 2022-11-08 NOTE — Research (Incomplete Revision)
V1P Informed Consent Re consent   Protocol No. [id number] ICF Version number: 01.01.04     Subject met inclusion and exclusion criteria.  The informed consent form, study requirements and expectations were reviewed with the subject and questions and concerns were addressed prior to the signing of the consent form.  The subject verbalized understanding of the trial requirements.  The subject agreed to participate in the Victorion 1 Prevent trial and signed the informed consent on 11-08-22 at 0947 .  Informed consent was obtained prior to performance of any protocol-specific procedures. A copy of the signed informed consent was given to the subject and original copy was placed in the subject's medical record.    DATE: 08-November-2022  SUBJECT ID: [id number]   Visit:2         Day: 90          Prior or concomitant medications reviewed [x]   Lipid lowering medications reviewed [x]   Surgical history reviewed [x]   TIME: BP: PULSE:  0935 95/57 59  0941 94/54 57  0945 90/57 57  Mean BP: 93/56   Mean Pulse: 57  Labs collected at: 0953 Fasting Lipid Profile [x]  Fasting Lp(a) [x]  Liver function tests []  Exploratory biomarkers []   AE/SAE assessment completed [x]   Study drug administered [x]   Endpoint assessment completed [x]   [name] is here for V2 of V1P research. He reports no A/e's, no changes in his meds,, and no visits to the Ed or urgent care since seen last. Wt was 185 lbs. Scheduled next visit for Jan 27 at 0800. Injection was given in right lower abd at 0959 Kit number [id number] tol well.   Current Outpatient Medications:    alendronate (FOSAMAX) 70 MG tablet, Take 1 tablet (70 mg total) by mouth every 7 (seven) days. Take with a full glass of water on an empty stomach., Disp: 12 tablet, Rfl: 3   aspirin 81 MG chewable tablet, Chew 81 mg by mouth daily., Disp: , Rfl:    atorvastatin (LIPITOR) 40 MG tablet, Take 1 tablet (40 mg total) by mouth daily. Please keep upcoming  appointment in May for future refills. Thank you., Disp: 30 tablet, Rfl: 0   empagliflozin (JARDIANCE) 10 MG TABS tablet, TAKE 1 TABLET BY MOUTH ONCE EVERY MORNING WITH BREAKFAST, Disp: 30 tablet, Rfl: 11   ENTRESTO 24-26 MG, TAKE 1 TABLET BY MOUTH TWICE DAILY, Disp: 180 tablet, Rfl: 3   famotidine (PEPCID) 20 MG tablet, TAKE 1 TABLET BY MOUTH 1 HOUR BEFORE BEDTIME, Disp: 30 tablet, Rfl: 11   metoprolol tartrate (LOPRESSOR) 25 MG tablet, Take 1 tablet (25 mg total) by mouth 2 (two) times daily., Disp: 60 tablet, Rfl: 11   omeprazole (PRILOSEC) 20 MG capsule, TAKE 1 CAPSULE BY MOUTH TWICE DAILY BEFORE A MEAL, Disp: 180 capsule, Rfl: 1   oxybutynin (DITROPAN-XL) 10 MG 24 hr tablet, Take 1 tablet (10 mg total) by mouth daily., Disp: 30 tablet, Rfl: 2   spironolactone (ALDACTONE) 25 MG tablet, Take 0.5 tablets (12.5 mg total) by mouth daily. KEEP OV., Disp: 45 tablet, Rfl: 0  Current Facility-Administered Medications:    Study - VICTORION-1 PREVENT - inclisiran 300 mg/1.84mL or placebo SQ injection (PI-Stuckey), 300 mg, Subcutaneous, Q6 months,

## 2022-11-10 ENCOUNTER — Ambulatory Visit: Payer: BC Managed Care – PPO | Admitting: Internal Medicine

## 2022-11-24 ENCOUNTER — Telehealth: Payer: Self-pay

## 2022-11-24 ENCOUNTER — Other Ambulatory Visit: Payer: Self-pay

## 2022-11-24 ENCOUNTER — Ambulatory Visit: Payer: BC Managed Care – PPO | Attending: Internal Medicine | Admitting: Internal Medicine

## 2022-11-24 ENCOUNTER — Encounter: Payer: Self-pay | Admitting: Internal Medicine

## 2022-11-24 VITALS — BP 100/62 | HR 54 | Ht 75.0 in | Wt 186.4 lb

## 2022-11-24 DIAGNOSIS — R1319 Other dysphagia: Secondary | ICD-10-CM

## 2022-11-24 DIAGNOSIS — R634 Abnormal weight loss: Secondary | ICD-10-CM

## 2022-11-24 DIAGNOSIS — I471 Supraventricular tachycardia, unspecified: Secondary | ICD-10-CM | POA: Diagnosis not present

## 2022-11-24 MED ORDER — ATORVASTATIN CALCIUM 40 MG PO TABS: 40.0000 mg | ORAL_TABLET | Freq: Every day | ORAL | 3 refills | Status: DC

## 2022-11-24 MED ORDER — ENTRESTO 24-26 MG PO TABS: 1.0000 | ORAL_TABLET | Freq: Two times a day (BID) | ORAL | 3 refills | Status: DC

## 2022-11-24 MED ORDER — SPIRONOLACTONE 25 MG PO TABS: 12.5000 mg | ORAL_TABLET | Freq: Every day | ORAL | 3 refills | Status: AC

## 2022-11-24 NOTE — Telephone Encounter (Signed)
Pt has been scheduled for 8/20 EGD... PPW released to pt and he is aware of instructions and hold of Jardiance 3 days prior

## 2022-11-24 NOTE — Telephone Encounter (Signed)
good morning  [name] from cardiology  I was wondering if you might be able to help with this guy-- 70 lb weight loss over last 3 months and most specific complaint is dysphagia, only soup goes down easily.  he  needs somebody to start the evaluation--thoughts

## 2022-11-24 NOTE — Telephone Encounter (Signed)
will arrange for EGD.  Also having some back pain.  So the EGD is negative, he will need further evaluation   From a cardiac perspective would be cleared for his endoscopy          11/24/2022 11:00 AM   Office Visit  Provider:   Sherryl Manges, MD

## 2022-11-24 NOTE — Progress Notes (Signed)
ELECTROPHYSIOLOGY OFFICE NOTE  Patient ID: [name], MRN: [id number], DOB/AGE: 1960/05/21 62 y.o. Admit date: (Not on file) Date of Consult: 11/24/2022  Primary Physician: Larae Grooms, NP Primary Cardiologist:          HPI [name] is a 62 y.o. male is seen in follow-up for nonischemic cardiomyopathy and a prior history of syncope   initially seen for episode of chest pain associated with a "heart rate over 280". 7/15 catheterization demonstrated no obstructive disease; left ventricular function was normal. He had a loop recorder implanted--7/18 he had a "5-second pause" at 2048 hrs.  Efforts to contact him were unsuccessful.  11/18 his device reached RRT abandoned  10/21 COVID-pneumonia.  Hospitalized and treated with monoclonal antibodies.  Improved but has continued to struggle with dyspnea on exertion. EVal included CXR >> atelectasis   Cardiac evaluation was as below, mild cardiomyopathy with a negative Myoview.  Continues however to have progressively worsening shortness of breath with exertion.  Also recurrent atypical chest pains.  Reviewed imaging studies, no comments made on calcification.  Being seen and evaluated by pulmonary; concern of vocal cord dysfunction.  Chest x-ray ordered.  Cardiopulmonary stress test recommended is a consideration.  Ongoing issues with dyspnea.  Cardiopulmonary evaluation as below.  CPX has been discussed but never consummated  Intermittent tachycardia associate with chest pain; event recorder 7/23 demonstrated 148 episodes of SVT up to 1 minute in duration.    DATE TEST EF   7/15 Cath  Normal CAs  7/16 ECHO  NORMAL   7/16 ETT NO atrial tachycardia   6/22 Echo  45%    6/22 Myoview  50% No perfusion defects  8/22 CTA  CaScore 108 NonObstructive LAD/RCA  4/23 Echo  45-50%    Date Cr K Hgb  10/21 0.8 3.2 15.9  5/22  0.6 3.9 16.4  8/22  4.2   4/23 0.9 4.0   9/23 0.82 4.4 16.4      Past Medical History:  Diagnosis  Date   Anginal pain (HCC) 10/16/2013   Aortic atherosclerosis (HCC) 11/16/2020   Ascending aorta dilatation (HCC) 09/29/2020   a.) TTE 09/29/2020: Ao root 37 mm, asc Ao 39 mm, Ao arch 33 mm; b.) TTE 08/06/2021: Ao root 37 mm   CAD (coronary artery disease) 10/17/2013   a.) LHC 10/17/2013: EF 65%. 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) 09/29/2020   a.) TTE 09/29/2020: EF 45-50%, sev bas-mid inferior HK, GLS -15.5%, mild MR, G1DD; b.) TTE 08/06/2021: EF 45-50%, no RWMAs, GLS -16.5%, mild MR, G2DD   History of 2019 novel coronavirus disease (COVID-19) 01/2020   Hypertension 1976   only on meds for a year   Implantable loop recorder 12/09/2013   a.) Medtronic ILR device   NICM (nonischemic cardiomyopathy) (HCC)    a.) TTE 10/08/2014: EF 50-55%; b.) TTE 09/29/2020: EF 45-50%; c.) TTE 08/06/2021: EF 45-50%   Nonsustained ventricular tachycardia (HCC) 05/2014   a.) noted on ILR in 05/2014; b.) holter 10/2021: NSVT x 2 episodes; longest lasted 6 beats at a rate of 197 bpm   Pneumonia due to COVID-19 virus 01/2020   a.) hospitalized  and Tx'd with MAB infusions   Prostatism 01/2022   surgery - benign   PSVT (paroxysmal supraventricular tachycardia) 10/2021   a.) holter 10/2021: 148 SVT episodes with max HR 226 bpm   Sinus tachycardia    Syncope 10/16/2013      Surgical History:  Past Surgical History:  Procedure Laterality Date   COLONOSCOPY WITH PROPOFOL N/A 11/24/2020   Procedure: COLONOSCOPY WITH PROPOFOL;  Surgeon: [name], MD;  Location[facility] ENDOSCOPY;  Service: Endoscopy;  Laterality: N/A;   LEFT HEART CATHETERIZATION WITH CORONARY ANGIOGRAM N/A 10/17/2013    Procedure: LEFT HEART CATHETERIZATION WITH CORONARY ANGIOGRAM;  Surgeon: Kathleene Hazel, MD;  Location: [facility] CATH LAB;  Service: Cardiovascular;  Laterality: N/A;   LOOP RECORDER IMPLANT N/A 12/09/2013   Procedure: LOOP RECORDER IMPLANT;  Surgeon: Duke Salvia, MD;  Location: [facility] CATH LAB;  Service: Cardiovascular;  Laterality: N/A;   TRANSURETHRAL RESECTION OF PROSTATE N/A 01/10/2022   Procedure: TRANSURETHRAL RESECTION OF THE PROSTATE (TURP);  Surgeon: Vanna Scotland, MD;  Location: [facility] ORS;  Service: Urology;  Laterality: N/A;     Home Meds: Current Meds  Medication Sig   alendronate (FOSAMAX) 70 MG tablet Take 1 tablet (70 mg total) by mouth every 7 (seven) days. Take with a full glass of water on an empty stomach.   aspirin 81 MG chewable tablet Chew 81 mg by mouth daily.   atorvastatin (LIPITOR) 40 MG tablet Take 1 tablet (40 mg total) by mouth daily. Please keep upcoming appointment for future refills. Thank you.   empagliflozin (JARDIANCE) 10 MG TABS tablet TAKE 1 TABLET BY MOUTH ONCE EVERY MORNING WITH BREAKFAST   famotidine (PEPCID) 20 MG tablet TAKE 1 TABLET BY MOUTH 1 HOUR BEFORE BEDTIME   metoprolol tartrate (LOPRESSOR) 25 MG tablet Take 1 tablet (25 mg total) by mouth 2 (two) times daily.   omeprazole (PRILOSEC) 20 MG capsule TAKE 1 CAPSULE BY MOUTH TWICE DAILY BEFORE A MEAL   oxybutynin (DITROPAN-XL) 10 MG 24 hr tablet Take 1 tablet (10 mg total) by mouth daily.   sacubitril-valsartan (ENTRESTO) 24-26 MG Take 1 tablet by mouth 2 (two) times daily. Please keep scheduled appointment for future refills. Thank you.   spironolactone (ALDACTONE) 25 MG tablet Take 0.5 tablets (12.5 mg total) by mouth daily. KEEP OV.   Current Facility-Administered Medications for the 11/24/22 encounter (Office Visit) with Duke Salvia, MD  Medication   Study - VICTORION-1 PREVENT - inclisiran 300 mg/1.57mL or placebo SQ injection (PI-Stuckey)    Allergies:  Allergies  Allergen Reactions    Amoxicillin Rash        ROS:  Please see the history of present illness.     All other systems reviewed and negative.   Physical Examination  BP 100/62   Pulse (!) 54   Ht 6\' 3"  (1.905 m)   Wt 186 lb 6.4 oz (84.6 kg)   SpO2 99%   BMI 23.30 kg/m  Well developed and well nourished in no acute distress HENT normal Neck supple with JVP-flat Clear Device pocket well healed; without hematoma or erythema.  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  With his HFmrEF will continue Farxiga and Aldactone.  Also Entresto and metoprolol.  Bradycardia is not limiting blood pressure also surprisingly not.  Continue his current meds  Has had a 70 pound weight loss with dysphagia over the last 3 months.  Some anorexia.  Have reached out to his GI guy and he will arrange for EGD.  Also having some back pain.  So the EGD is negative, he will need further evaluation  From a cardiac perspective would be cleared for his endoscopy       Sherryl Manges

## 2022-11-24 NOTE — Patient Instructions (Signed)
 Medication Instructions:  The current medical regimen is effective;  continue present plan and medications.  *If you need a refill on your cardiac medications before your next appointment, please call your pharmacy*   Follow-Up: At [facility], you and your health needs are our priority.  As part of our continuing mission to provide you with exceptional heart care, we have created designated Provider Care Teams.  These Care Teams include your primary Cardiologist (physician) and Advanced Practice Providers (APPs -  Physician Assistants and Nurse Practitioners) who all work together to provide you with the care you need, when you need it.  We recommend signing up for the patient portal called "MyChart".  Sign up information is provided on this After Visit Summary.  MyChart is used to connect with patients for Virtual Visits (Telemedicine).  Patients are able to view lab/test results, encounter notes, upcoming appointments, etc.  Non-urgent messages can be sent to your provider as well.   To learn more about what you can do with MyChart, go to [email or web address].    Your next appointment:   12 month(s)  Provider:   Sherryl Manges, MD

## 2022-11-29 ENCOUNTER — Encounter
Admission: RE | Disposition: A | Discharge status: Home / Self Care | Payer: Self-pay | Source: Home / Self Care | Attending: Gastroenterology

## 2022-11-29 ENCOUNTER — Other Ambulatory Visit: Payer: Self-pay

## 2022-11-29 ENCOUNTER — Ambulatory Visit: Payer: BC Managed Care – PPO | Admitting: Anesthesiology

## 2022-11-29 ENCOUNTER — Encounter: Payer: Self-pay | Admitting: Gastroenterology

## 2022-11-29 ENCOUNTER — Ambulatory Visit
Admission: RE | Admit: 2022-11-29 | Discharge: 2022-11-29 | Disposition: A | Discharge status: Home / Self Care | Payer: BC Managed Care – PPO | Attending: Gastroenterology | Admitting: Gastroenterology

## 2022-11-29 DIAGNOSIS — K219 Gastro-esophageal reflux disease without esophagitis: Secondary | ICD-10-CM | POA: Insufficient documentation

## 2022-11-29 DIAGNOSIS — Z8616 Personal history of COVID-19: Secondary | ICD-10-CM | POA: Diagnosis not present

## 2022-11-29 DIAGNOSIS — K222 Esophageal obstruction: Secondary | ICD-10-CM

## 2022-11-29 DIAGNOSIS — R1310 Dysphagia, unspecified: Secondary | ICD-10-CM | POA: Insufficient documentation

## 2022-11-29 DIAGNOSIS — I5022 Chronic systolic (congestive) heart failure: Secondary | ICD-10-CM | POA: Insufficient documentation

## 2022-11-29 DIAGNOSIS — I2583 Coronary atherosclerosis due to lipid rich plaque: Secondary | ICD-10-CM | POA: Diagnosis not present

## 2022-11-29 DIAGNOSIS — I2510 Atherosclerotic heart disease of native coronary artery without angina pectoris: Secondary | ICD-10-CM | POA: Diagnosis not present

## 2022-11-29 DIAGNOSIS — I110 Hypertensive heart disease with heart failure: Secondary | ICD-10-CM | POA: Diagnosis not present

## 2022-11-29 DIAGNOSIS — I428 Other cardiomyopathies: Secondary | ICD-10-CM | POA: Diagnosis not present

## 2022-11-29 DIAGNOSIS — I509 Heart failure, unspecified: Secondary | ICD-10-CM | POA: Diagnosis not present

## 2022-11-29 DIAGNOSIS — I4719 Other supraventricular tachycardia: Secondary | ICD-10-CM | POA: Insufficient documentation

## 2022-11-29 DIAGNOSIS — R1319 Other dysphagia: Secondary | ICD-10-CM

## 2022-11-29 DIAGNOSIS — R634 Abnormal weight loss: Secondary | ICD-10-CM

## 2022-11-29 HISTORY — PX: ESOPHAGEAL DILATION: SHX303

## 2022-11-29 HISTORY — PX: ESOPHAGOGASTRODUODENOSCOPY (EGD) WITH PROPOFOL: SHX5813

## 2022-11-29 SURGERY — ESOPHAGOGASTRODUODENOSCOPY (EGD) WITH PROPOFOL
Anesthesia: General

## 2022-11-29 MED ORDER — LIDOCAINE HCL (CARDIAC) PF 100 MG/5ML IV SOSY
PREFILLED_SYRINGE | INTRAVENOUS | Status: DC | PRN
  Administered 2022-11-29: 100 mg via INTRAVENOUS

## 2022-11-29 MED ORDER — GLYCOPYRROLATE 0.2 MG/ML IJ SOLN
INTRAMUSCULAR | Status: DC | PRN
  Administered 2022-11-29: .2 mg via INTRAVENOUS

## 2022-11-29 MED ORDER — PROPOFOL 500 MG/50ML IV EMUL
INTRAVENOUS | Status: DC | PRN
  Administered 2022-11-29: 145 ug/kg/min via INTRAVENOUS

## 2022-11-29 MED ORDER — SODIUM CHLORIDE 0.9 % IV SOLN: INTRAVENOUS | Status: DC

## 2022-11-29 MED ORDER — PROPOFOL 10 MG/ML IV BOLUS
INTRAVENOUS | Status: DC | PRN
  Administered 2022-11-29: 70 mg via INTRAVENOUS
  Administered 2022-11-29: 30 mg via INTRAVENOUS

## 2022-11-29 NOTE — Anesthesia Preprocedure Evaluation (Signed)
Anesthesia Evaluation  Patient identified by MRN, date of birth, ID band Patient awake    Reviewed: Allergy & Precautions, NPO status , Patient's Chart, lab work & pertinent test results  History of Anesthesia Complications Negative for: history of anesthetic complications  Airway Mallampati: III  TM Distance: <3 FB Neck ROM: full    Dental  (+) Poor Dentition, Missing   Pulmonary neg pulmonary ROS, neg shortness of breath   Pulmonary exam normal        Cardiovascular Exercise Tolerance: Good hypertension, (-) angina + CAD and + DOE  Normal cardiovascular exam     Neuro/Psych negative neurological ROS  negative psych ROS   GI/Hepatic Neg liver ROS,GERD  Controlled,,  Endo/Other  negative endocrine ROS    Renal/GU negative Renal ROS  negative genitourinary   Musculoskeletal   Abdominal   Peds  Hematology negative hematology ROS (+)   Anesthesia Other Findings Patient reports that they do not think that any food or pills are stuck in their throat at this time.  Past Medical History: 10/16/2013: Anginal pain (HCC) 11/16/2020: Aortic atherosclerosis (HCC) 09/29/2020: Ascending aorta dilatation (HCC)     Comment:  a.) TTE 09/29/2020: Ao root 37 mm, asc Ao 39 mm, Ao arch              33 mm; b.) TTE 08/06/2021: Ao root 37 mm 10/17/2013: CAD (coronary artery disease)     Comment:  a.) LHC 10/17/2013: EF 65%. 20% mLAD - med mgmt No date: Family history of blood clots     Comment:  a.) hypercoagulable work-up 09/2021 was negative; PT               gene mutation, Factor V leiden mutation, Protein C, S,               and AT III all normal/negative 10/16/2013: Fatigue due to sleep pattern disturbance 2023: GERD (gastroesophageal reflux disease) 09/29/2020: Heart failure with mid-range ejection fraction (HFmEF)  (HCC)     Comment:  a.) TTE 09/29/2020: EF 45-50%, sev bas-mid inferior HK,               GLS -15.5%, mild  MR, G1DD; b.) TTE 08/06/2021: EF 45-50%,              no RWMAs, GLS -16.5%, mild MR, G2DD 01/2020: History of 2019 novel coronavirus disease (COVID-19) 1976: Hypertension     Comment:  only on meds for a year 12/09/2013: Implantable loop recorder     Comment:  a.) Medtronic ILR device No date: NICM (nonischemic cardiomyopathy) (HCC)     Comment:  a.) TTE 10/08/2014: EF 50-55%; b.) TTE 09/29/2020: EF               45-50%; c.) TTE 08/06/2021: EF 45-50% 05/2014: Nonsustained ventricular tachycardia (HCC)     Comment:  a.) noted on ILR in 05/2014; b.) holter 10/2021: NSVT x               2 episodes; longest lasted 6 beats at a rate of 197 bpm 01/2020: Pneumonia due to COVID-19 virus     Comment:  a.) hospitalized and Tx'd with MAB infusions 01/2022: Prostatism     Comment:  surgery - benign 10/2021: PSVT (paroxysmal supraventricular tachycardia)     Comment:  a.) holter 10/2021: 148 SVT episodes with max HR 226 bpm No date: Sinus tachycardia 10/16/2013: Syncope  Past Surgical History: 11/24/2020: COLONOSCOPY WITH PROPOFOL; N/A     Comment:  Procedure:  COLONOSCOPY WITH PROPOFOL;  Surgeon: [name], MD;  Location: [facility] ENDOSCOPY;  Service:               Endoscopy;  Laterality: N/A; 10/17/2013: LEFT HEART CATHETERIZATION WITH CORONARY ANGIOGRAM; N/A     Comment:  Procedure: LEFT HEART CATHETERIZATION WITH CORONARY               ANGIOGRAM;  Surgeon: Kathleene Hazel, MD;                Location: [facility] CATH LAB;  Service: Cardiovascular;                Laterality: N/A; 12/09/2013: LOOP RECORDER IMPLANT; N/A     Comment:  Procedure: LOOP RECORDER IMPLANT;  Surgeon: Duke Salvia, MD;  Location: [facility] CATH LAB;  Service:               Cardiovascular;  Laterality: N/A; 01/10/2022: TRANSURETHRAL RESECTION OF PROSTATE; N/A     Comment:  Procedure: TRANSURETHRAL RESECTION OF THE PROSTATE               (TURP);  Surgeon: Vanna Scotland, MD;  Location: [facility]                ORS;  Service: Urology;  Laterality: N/A;     Reproductive/Obstetrics negative OB ROS                             Anesthesia Physical Anesthesia Plan  ASA: 3  Anesthesia Plan: General   Post-op Pain Management:    Induction: Intravenous  PONV Risk Score and Plan: Propofol infusion and TIVA  Airway Management Planned: Natural Airway and Nasal Cannula  Additional Equipment:   Intra-op Plan:   Post-operative Plan:   Informed Consent: I have reviewed the patients History and Physical, chart, labs and discussed the procedure including the risks, benefits and alternatives for the proposed anesthesia with the patient or authorized representative who has indicated his/her understanding and acceptance.     Dental Advisory Given  Plan Discussed with: Anesthesiologist, CRNA and Surgeon  Anesthesia Plan Comments: (Patient consented for risks of anesthesia including but not limited to:  - adverse reactions to medications - risk of airway placement if required - damage to eyes, teeth, lips or other oral mucosa - nerve damage due to positioning  - sore throat or hoarseness - Damage to heart, brain, nerves, lungs, other parts of body or loss of life  Patient voiced understanding.)       Anesthesia Quick Evaluation

## 2022-11-29 NOTE — Transfer of Care (Signed)
Immediate Anesthesia Transfer of Care Note  Patient: [name]  Procedure(s) Performed: ESOPHAGOGASTRODUODENOSCOPY (EGD) WITH PROPOFOL ESOPHAGEAL DILATION  Patient Location: Endoscopy Unit  Anesthesia Type:General  Level of Consciousness: drowsy and patient cooperative  Airway & Oxygen Therapy: Patient Spontanous Breathing and Patient connected to face mask oxygen  Post-op Assessment: Report given to RN and Post -op Vital signs reviewed and stable  Post vital signs: Reviewed and stable  Last Vitals:  Vitals Value Taken Time  BP 105/78 11/29/22 1040  Temp    Pulse 71 11/29/22 1042  Resp 18 11/29/22 1042  SpO2 100 % 11/29/22 1042  Vitals shown include unfiled device data.  Last Pain:  Vitals:   11/29/22 1040  TempSrc:   PainSc: Asleep         Complications: No notable events documented.

## 2022-11-29 NOTE — Anesthesia Procedure Notes (Signed)
Procedure Name: General with mask airway Date/Time: 11/29/2022 10:34 AM  Performed by: Mohammed Kindle, CRNAPre-anesthesia Checklist: Patient identified, Emergency Drugs available, Suction available and Patient being monitored Patient Re-evaluated:Patient Re-evaluated prior to induction Oxygen Delivery Method: Simple face mask Induction Type: IV induction Placement Confirmation: positive ETCO2, CO2 detector and breath sounds checked- equal and bilateral Dental Injury: Teeth and Oropharynx as per pre-operative assessment

## 2022-11-29 NOTE — H&P (Signed)
Brent Minium, MD [facility] [address], [address] [place], [place] [address] [phone] Fax : [phone]  Primary Care Physician:  Brent Grooms, NP Primary Gastroenterologist:  Dr. Servando [name]  Pre-Procedure History & Physical: HPI:  [name] is a 62 y.o. male is here for an endoscopy.   Past Medical History:  Diagnosis Date   Anginal pain (HCC) 10/16/2013   Aortic atherosclerosis (HCC) 11/16/2020   Ascending aorta dilatation (HCC) 09/29/2020   a.) TTE 09/29/2020: Ao root 37 mm, asc Ao 39 mm, Ao arch 33 mm; b.) TTE 08/06/2021: Ao root 37 mm   CAD (coronary artery disease) 10/17/2013   a.) LHC 10/17/2013: EF 65%. 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) 09/29/2020   a.) TTE 09/29/2020: EF 45-50%, sev bas-mid inferior HK, GLS -15.5%, mild MR, G1DD; b.) TTE 08/06/2021: EF 45-50%, no RWMAs, GLS -16.5%, mild MR, G2DD   History of 2019 novel coronavirus disease (COVID-19) 01/2020   Hypertension 1976   only on meds for a year   Implantable loop recorder 12/09/2013   a.) Medtronic ILR device   NICM (nonischemic cardiomyopathy) (HCC)    a.) TTE 10/08/2014: EF 50-55%; b.) TTE 09/29/2020: EF 45-50%; c.) TTE 08/06/2021: EF 45-50%   Nonsustained ventricular tachycardia (HCC) 05/2014   a.) noted on ILR in 05/2014; b.) holter 10/2021: NSVT x 2 episodes; longest lasted 6 beats at a rate of 197 bpm   Pneumonia due to COVID-19 virus 01/2020   a.) hospitalized and Tx'd with MAB infusions   Prostatism 01/2022   surgery - benign   PSVT (paroxysmal supraventricular tachycardia) 10/2021   a.) holter 10/2021: 148 SVT episodes with max HR 226 bpm   Sinus tachycardia    Syncope  10/16/2013    Past Surgical History:  Procedure Laterality Date   COLONOSCOPY WITH PROPOFOL N/A 11/24/2020   Procedure: COLONOSCOPY WITH PROPOFOL;  Surgeon: Brent Minium, MD;  Location: [facility] ENDOSCOPY;  Service: Endoscopy;  Laterality: N/A;   LEFT HEART CATHETERIZATION WITH CORONARY ANGIOGRAM N/A 10/17/2013   Procedure: LEFT HEART CATHETERIZATION WITH CORONARY ANGIOGRAM;  Surgeon: Brent Hazel, MD;  Location: [facility] CATH LAB;  Service: Cardiovascular;  Laterality: N/A;   LOOP RECORDER IMPLANT N/A 12/09/2013   Procedure: LOOP RECORDER IMPLANT;  Surgeon: Brent Salvia, MD;  Location: [facility] CATH LAB;  Service: Cardiovascular;  Laterality: N/A;   TRANSURETHRAL RESECTION OF PROSTATE N/A 01/10/2022   Procedure: TRANSURETHRAL RESECTION OF THE PROSTATE (TURP);  Surgeon: Brent Scotland, MD;  Location: [facility] ORS;  Service: Urology;  Laterality: N/A;    Prior to Admission medications   Medication Sig Start Date End Date Taking? Authorizing Provider  aspirin 81 MG chewable tablet Chew 81 mg by mouth daily.   Yes [provider]  atorvastatin (LIPITOR) 40 MG tablet Take 1 tablet (40 mg total) by mouth daily. Please keep upcoming appointment for future refills. Thank you. 11/24/22  Yes Brent Salvia, MD  empagliflozin (JARDIANCE) 10 MG TABS tablet TAKE 1 TABLET BY MOUTH ONCE EVERY MORNING WITH BREAKFAST 02/02/22  Yes Brent Salvia, MD  famotidine (PEPCID) 20 MG tablet TAKE 1 TABLET BY MOUTH 1 HOUR BEFORE BEDTIME 02/21/22  Yes Brent Cowden,  MD  metoprolol tartrate (LOPRESSOR) 25 MG tablet Take 1 tablet (25 mg total) by mouth 2 (two) times daily. 08/04/22  Yes Brent Salvia, MD  omeprazole (PRILOSEC) 20 MG capsule TAKE 1 CAPSULE BY MOUTH TWICE DAILY BEFORE A MEAL 05/12/22  Yes Brent Grooms, NP  oxybutynin (DITROPAN-XL) 10 MG 24 hr tablet Take 1 tablet (10 mg total) by mouth daily. 07/12/22  Yes [name], Brent Mast, PA-C  spironolactone (ALDACTONE) 25 MG tablet Take 0.5 tablets (12.5 mg  total) by mouth daily. KEEP OV. 11/24/22  Yes Brent Salvia, MD  alendronate (FOSAMAX) 70 MG tablet Take 1 tablet (70 mg total) by mouth every 7 (seven) days. Take with a full glass of water on an empty stomach. 02/01/22   Brent Grooms, NP  sacubitril-valsartan (ENTRESTO) 24-26 MG Take 1 tablet by mouth 2 (two) times daily. Please keep scheduled appointment for future refills. Thank you. 11/24/22   Brent Salvia, MD    Allergies as of 11/24/2022 - Review Complete 11/24/2022  Allergen Reaction Noted   Amoxicillin Rash 10/16/2013    Family History  Problem Relation Age of Onset   Hypertension Mother    Arrhythmia Mother    Heart disease Mother    Parkinson's disease Mother    Alzheimer's disease Mother    Deep vein thrombosis Father    Colon cancer Father    Sudden death Maternal Grandfather 47   ADD / ADHD Paternal Aunt    Deep vein thrombosis Paternal Aunt    Deep vein thrombosis Paternal Uncle     Social History   Socioeconomic History   Marital status: Significant Other    Spouse name: JUDY   Number of children: Not on file   Years of education: Not on file   Highest education level: Not on file  Occupational History   Not on file  Tobacco Use   Smoking status: Never    Passive exposure: Never   Smokeless tobacco: Never  Vaping Use   Vaping status: Never Used  Substance and Sexual Activity   Alcohol use: No   Drug use: No   Sexual activity: Yes  Other Topics Concern   Not on file  Social History Narrative   Not on file   Social Determinants of Health   Financial Resource Strain: Not on file  Food Insecurity: Not on file  Transportation Needs: Not on file  Physical Activity: Not on file  Stress: Not on file  Social Connections: Not on file  Intimate Partner Violence: Not on file    Review of Systems: See HPI, otherwise negative ROS  Physical Exam: BP 124/88   Pulse 64   Temp (!) 97.5 F (36.4 C) (Temporal)   Resp 18   Ht 6\' 3"  (1.905 m)    Wt 83 kg   SpO2 99%   BMI 22.87 kg/m  General:   Alert,  pleasant and cooperative in NAD Head:  Normocephalic and atraumatic. Neck:  Supple; no masses or thyromegaly. Lungs:  Clear throughout to auscultation.    Heart:  Regular rate and rhythm. Abdomen:  Soft, nontender and nondistended. Normal bowel sounds, without guarding, and without rebound.   Neurologic:  Alert and  oriented x4;  grossly normal neurologically.  Impression/Plan: DUVID FAVRET is here for an endoscopy to be performed for weight loss and dysphagia  Risks, benefits, limitations, and alternatives regarding  endoscopy have been reviewed with the patient.  Questions have been answered.  All parties agreeable.   Brent Minium,  MD  11/29/2022, 10:19 AM

## 2022-11-29 NOTE — Anesthesia Postprocedure Evaluation (Signed)
Anesthesia Post Note  Patient: [name]  Procedure(s) Performed: ESOPHAGOGASTRODUODENOSCOPY (EGD) WITH PROPOFOL ESOPHAGEAL DILATION  Patient location during evaluation: Endoscopy Anesthesia Type: General Level of consciousness: awake and alert Pain management: pain level controlled Vital Signs Assessment: post-procedure vital signs reviewed and stable Respiratory status: spontaneous breathing, nonlabored ventilation, respiratory function stable and patient connected to nasal cannula oxygen Cardiovascular status: blood pressure returned to baseline and stable Postop Assessment: no apparent nausea or vomiting Anesthetic complications: no   No notable events documented.   Last Vitals:  Vitals:   11/29/22 1050 11/29/22 1100  BP: 108/75 125/88  Pulse: 70 76  Resp: 20 (!) 21  Temp:    SpO2: 97% 100%    Last Pain:  Vitals:   11/29/22 1100  TempSrc:   PainSc: 0-No pain                 Cleda Mccreedy Fielding Mault

## 2022-11-29 NOTE — Op Note (Signed)
[facility] Gastroenterology Patient Name: [name] Procedure Date: 11/29/2022 10:19 AM MRN: [id number] Account #: [id number] Date of Birth: 1960/11/16 Admit Type: Outpatient Age: 62 Room: [facility] ENDO ROOM 4 Gender: Male Note Status: Finalized Instrument Name: Upper Endoscope [id number] Procedure:             Upper GI endoscopy Indications:           Dysphagia Providers:             [name] MD, MD Referring MD:          Larae Grooms (Referring MD) Medicines:             Propofol per Anesthesia Complications:         No immediate complications. Procedure:             Pre-Anesthesia Assessment:                        - Prior to the procedure, a History and Physical was                         performed, and patient medications and allergies were                         reviewed. The patient's tolerance of previous                         anesthesia was also reviewed. The risks and benefits                         of the procedure and the sedation options and risks                         were discussed with the patient. All questions were                         answered, and informed consent was obtained. Prior                         Anticoagulants: The patient has taken no anticoagulant                         or antiplatelet agents. ASA Grade Assessment: II - A                         patient with mild systemic disease. After reviewing                         the risks and benefits, the patient was deemed in                         satisfactory condition to undergo the procedure.                        After obtaining informed consent, the endoscope was                         passed under direct vision. Throughout the procedure,  the patient's blood pressure, pulse, and oxygen                         saturations were monitored continuously. The Endoscope                         was introduced through the mouth, and advanced to the                          second part of duodenum. The upper GI endoscopy was                         accomplished without difficulty. The patient tolerated                         the procedure well. Findings:      One benign-appearing, intrinsic mild stenosis was found at the       gastroesophageal junction. The stenosis was traversed. A TTS dilator was       passed through the scope. Dilation with a 15-16.5-18 mm balloon dilator       was performed to 18 mm. The dilation site was examined following       endoscope reinsertion and showed complete resolution of luminal       narrowing.      The stomach was normal.      The examined duodenum was normal. Impression:            - Benign-appearing esophageal stenosis. Dilated.                        - Normal stomach.                        - Normal examined duodenum.                        - No specimens collected. Recommendation:        - Discharge patient to home.                        - Resume previous diet.                        - Continue present medications.                        - Perform a colonoscopy at appointment to be scheduled. Procedure Code(s):     --- Professional ---                        [phone], Esophagogastroduodenoscopy, flexible,                         transoral; with transendoscopic balloon dilation of                         esophagus (less than 30 mm diameter) Diagnosis Code(s):     --- Professional ---                        R13.10, Dysphagia, unspecified  K22.2, Esophageal obstruction CPT copyright 2022 American Medical Association. All rights reserved. The codes documented in this report are preliminary and upon coder review may  be revised to meet current compliance requirements. [name] MD, MD 11/29/2022 10:38:27 AM This report has been signed electronically. Number of Addenda: 0 Note Initiated On: 11/29/2022 10:19 AM Estimated Blood Loss:  Estimated blood loss: none.      [facility]

## 2022-11-30 ENCOUNTER — Encounter: Payer: Self-pay | Admitting: Nurse Practitioner

## 2022-11-30 ENCOUNTER — Encounter: Payer: Self-pay | Admitting: Gastroenterology

## 2022-12-06 ENCOUNTER — Other Ambulatory Visit: Payer: Self-pay | Admitting: Physician Assistant

## 2022-12-06 ENCOUNTER — Other Ambulatory Visit: Payer: Self-pay | Admitting: Nurse Practitioner

## 2022-12-06 DIAGNOSIS — N138 Other obstructive and reflux uropathy: Secondary | ICD-10-CM

## 2022-12-08 NOTE — Telephone Encounter (Signed)
Requested Prescriptions  Refused Prescriptions Disp Refills   alendronate (FOSAMAX) 70 MG tablet [Pharmacy Med Name: ALENDRONATE SODIUM 70 MG TAB] 12 tablet 3    Sig: TAKE ONE TABLET BY MOUTH EACH WEEK, ON AN EMPTY STOMACH BEFORE BREAKFAST WITH 8oz OF WATER AND REMAIN UPRIGHT FOR :30     Endocrinology:  Bisphosphonates Failed - 12/06/2022  6:24 PM      Failed - Vitamin D in normal range and within 360 days    No results found for: "[id number]", "FA2130QM5", "[id number]", "25OHVITD3", "25OHVITD2", "25OHVITD1", "VD25OH"       Failed - Mg Level in normal range and within 360 days    Magnesium  Date Value Ref Range Status  10/16/2013 2.0 mg/dL Final    Comment:    4.1-3.2 THERAPEUTIC RANGE: 4-7 mg/dL TOXIC: > 10 mg/dL  -----------------------          Failed - Phosphate in normal range and within 360 days    No results found for: "PHOS"       Passed - Ca in normal range and within 360 days    Calcium  Date Value Ref Range Status  01/06/2022 8.9 8.6 - 10.2 mg/dL Final   Calcium, Total  Date Value Ref Range Status  10/16/2013 8.9 8.5 - 10.1 mg/dL Final         Passed - Cr in normal range and within 360 days    Creatinine  Date Value Ref Range Status  10/16/2013 0.81 0.60 - 1.30 mg/dL Final   Creatinine, Ser  Date Value Ref Range Status  01/06/2022 0.82 0.76 - 1.27 mg/dL Final         Passed - eGFR is 30 or above and within 360 days    EGFR (African American)  Date Value Ref Range Status  10/16/2013 >60  Final   GFR calc Af Amer  Date Value Ref Range Status  10/17/2013 >90 >90 mL/min Final    Comment:    (NOTE) The eGFR has been calculated using the CKD EPI equation. This calculation has not been validated in all clinical situations. eGFR's persistently <90 mL/min signify possible Chronic Kidney Disease.   EGFR (Non-African Amer.)  Date Value Ref Range Status  10/16/2013 >60  Final    Comment:    eGFR values <49mL/min/1.73 m2 may be an indication of  chronic kidney disease (CKD). Calculated eGFR is useful in patients with stable renal function. The eGFR calculation will not be reliable in acutely ill patients when serum creatinine is changing rapidly. It is not useful in  patients on dialysis. The eGFR calculation may not be applicable to patients at the low and high extremes of body sizes, pregnant women, and vegetarians.    GFR, Estimated  Date Value Ref Range Status  01/05/2022 >60 >60 mL/min Final    Comment:    (NOTE) Calculated using the CKD-EPI Creatinine Equation (2021)    GFR  Date Value Ref Range Status  12/03/2013 120.46 >60.00 mL/min Final   eGFR  Date Value Ref Range Status  01/06/2022 99 >59 mL/min/1.73 Final         Passed - Valid encounter within last 12 months    Recent Outpatient Visits           10 months ago Rib pain   [facility] Larae Grooms, NP   11 months ago Encounter for annual physical exam   [facility] Mecum, Oswaldo Conroy, PA-C   1 year ago Posterior  pain of left hip   [facility] Mecum, Oswaldo Conroy, PA-C   1 year ago Family history of blood coagulation disorder   [facility] Vigg, Avanti, MD   1 year ago Need for influenza vaccination   [facility] Vigg, Roma Schanz, MD       Future Appointments             In 1 month Vaillancourt, Harland [facility] [place]   In 1 month End, Cristal Deer, MD [facility] Density or Dexa Scan completed in the last 2 years

## 2022-12-19 ENCOUNTER — Encounter: Payer: Self-pay | Admitting: Internal Medicine

## 2022-12-20 ENCOUNTER — Ambulatory Visit: Payer: BC Managed Care – PPO | Admitting: Physician Assistant

## 2022-12-22 ENCOUNTER — Telehealth: Payer: Self-pay

## 2022-12-22 ENCOUNTER — Encounter: Payer: Self-pay | Admitting: Internal Medicine

## 2022-12-22 ENCOUNTER — Ambulatory Visit: Payer: BC Managed Care – PPO | Attending: Internal Medicine | Admitting: Internal Medicine

## 2022-12-22 VITALS — BP 134/70 | HR 54 | Ht 75.0 in | Wt 186.2 lb

## 2022-12-22 DIAGNOSIS — Z959 Presence of cardiac and vascular implant and graft, unspecified: Secondary | ICD-10-CM | POA: Diagnosis not present

## 2022-12-22 DIAGNOSIS — R634 Abnormal weight loss: Secondary | ICD-10-CM

## 2022-12-22 NOTE — Progress Notes (Signed)
ELECTROPHYSIOLOGY OFFICE NOTE  Patient ID: [name], MRN: [id number], DOB/AGE: [birth year] 64 y.o. Admit date: (Not on file) Date of Consult: 12/22/2022  Primary Physician: Larae Grooms, NP Primary Cardiologist:          HPI [name] is a 62 y.o. male is seen in follow-up for nonischemic cardiomyopathy and a prior history of syncope   initially seen for episode of chest pain associated with a "heart rate over 280". 7/15 catheterization demonstrated no obstructive disease; left ventricular function was normal. He had a loop recorder implanted--7/18 he had a "5-second pause" at 2048 hrs.  Efforts to contact him were unsuccessful.  11/18 his device reached RRT abandoned  10/21 COVID-pneumonia.  Hospitalized and treated with monoclonal antibodies.  Improved but has continued to struggle with dyspnea on exertion. EVal included CXR >> atelectasis   Cardiac evaluation was as below, mild cardiomyopathy with a negative Myoview.  Continues however to have progressively worsening shortness of breath with exertion.  Also recurrent atypical chest pains.  Reviewed imaging studies, no comments made on calcification.  Being seen and evaluated by pulmonary; concern of vocal cord dysfunction.  Chest x-ray ordered.  Cardiopulmonary stress test recommended is a consideration.  Ongoing issues with dyspnea.   The bigger issue has been his profound weight loss.  He has been evaluated by GI.  He underwent endoscopy and apparently some stretching.  Dysphagia abated for a couple of days and then recurred.  He comes in today with concerns that his loop recorder is palpable.  DATE TEST EF   7/15 Cath  Normal CAs  7/16 ECHO  NORMAL   7/16 ETT NO atrial tachycardia   6/22 Echo  45%    6/22 Myoview  50% No perfusion defects  8/22 CTA  CaScore 108 NonObstructive LAD/RCA  4/23 Echo  45-50%    Date Cr K Hgb  10/21 0.8 3.2 15.9  5/22  0.6 3.9 16.4  8/22  4.2   4/23 0.9 4.0   9/23 0.82 4.4 16.4       Past Medical History:  Diagnosis Date   Anginal pain (HCC) 10/16/2013   Aortic atherosclerosis (HCC) 11/16/2020   Ascending aorta dilatation (HCC) 09/29/2020   a.) TTE 09/29/2020: Ao root 37 mm, asc Ao 39 mm, Ao arch 33 mm; b.) TTE 08/06/2021: Ao root 37 mm   CAD (coronary artery disease) 10/17/2013   a.) LHC 10/17/2013: EF 65%. 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) 09/29/2020   a.) TTE 09/29/2020: EF 45-50%, sev bas-mid inferior HK, GLS -15.5%, mild MR, G1DD; b.) TTE 08/06/2021: EF 45-50%, no RWMAs, GLS -16.5%, mild MR, G2DD   History of 2019 novel coronavirus disease (COVID-19) 01/2020   Hypertension 1976   only on meds for a year   Implantable loop recorder 12/09/2013   a.) [employer] ILR device   NICM (nonischemic cardiomyopathy) (HCC)    a.) TTE 10/08/2014: EF 50-55%; b.) TTE 09/29/2020: EF 45-50%; c.) TTE 08/06/2021: EF 45-50%   Nonsustained ventricular tachycardia (HCC) 05/2014   a.) noted on ILR in 05/2014; b.) holter 10/2021: NSVT x 2 episodes; longest lasted 6 beats at a rate of  197 bpm   Pneumonia due to COVID-19 virus 01/2020   a.) hospitalized and Tx'd with MAB infusions   Prostatism 01/2022   surgery - benign   PSVT (paroxysmal supraventricular tachycardia) 10/2021   a.) holter 10/2021: 148 SVT episodes with max HR 226 bpm   Sinus tachycardia    Syncope 10/16/2013      Surgical History:  Past Surgical History:  Procedure Laterality Date   COLONOSCOPY WITH PROPOFOL N/A 11/24/2020   Procedure: COLONOSCOPY WITH PROPOFOL;  Surgeon: [name], MD;  Location[facility] ENDOSCOPY;  Service: Endoscopy;  Laterality: N/A;   ESOPHAGEAL DILATION  11/29/2022    Procedure: ESOPHAGEAL DILATION;  Surgeon: [name], MD;  Location: [facility] ENDOSCOPY;  Service: Endoscopy;;   ESOPHAGOGASTRODUODENOSCOPY (EGD) WITH PROPOFOL N/A 11/29/2022   Procedure: ESOPHAGOGASTRODUODENOSCOPY (EGD) WITH PROPOFOL;  Surgeon: [name], MD;  Location: [facility] ENDOSCOPY;  Service: Endoscopy;  Laterality: N/A;   LEFT HEART CATHETERIZATION WITH CORONARY ANGIOGRAM N/A 10/17/2013   Procedure: LEFT HEART CATHETERIZATION WITH CORONARY ANGIOGRAM;  Surgeon: Kathleene Hazel, MD;  Location: [facility] CATH LAB;  Service: Cardiovascular;  Laterality: N/A;   LOOP RECORDER IMPLANT N/A 12/09/2013   Procedure: LOOP RECORDER IMPLANT;  Surgeon: Duke Salvia, MD;  Location: [facility] CATH LAB;  Service: Cardiovascular;  Laterality: N/A;   TRANSURETHRAL RESECTION OF PROSTATE N/A 01/10/2022   Procedure: TRANSURETHRAL RESECTION OF THE PROSTATE (TURP);  Surgeon: Vanna Scotland, MD;  Location: [facility] ORS;  Service: Urology;  Laterality: N/A;     Home Meds: Current Meds  Medication Sig   alendronate (FOSAMAX) 70 MG tablet Take 1 tablet (70 mg total) by mouth every 7 (seven) days. Take with a full glass of water on an empty stomach.   aspirin 81 MG chewable tablet Chew 81 mg by mouth daily.   atorvastatin (LIPITOR) 40 MG tablet Take 1 tablet (40 mg total) by mouth daily. Please keep upcoming appointment for future refills. Thank you.   empagliflozin (JARDIANCE) 10 MG TABS tablet TAKE 1 TABLET BY MOUTH ONCE EVERY MORNING WITH BREAKFAST   famotidine (PEPCID) 20 MG tablet TAKE 1 TABLET BY MOUTH 1 HOUR BEFORE BEDTIME   metoprolol tartrate (LOPRESSOR) 25 MG tablet Take 1 tablet (25 mg total) by mouth 2 (two) times daily.   omeprazole (PRILOSEC) 20 MG capsule TAKE 1 CAPSULE BY MOUTH TWICE DAILY BEFORE A MEAL   oxybutynin (DITROPAN-XL) 10 MG 24 hr tablet TAKE 1 TABLET BY MOUTH ONCE DAILY   sacubitril-valsartan (ENTRESTO) 24-26 MG Take 1 tablet by mouth 2 (two) times daily. Please keep scheduled appointment for  future refills. Thank you.   spironolactone (ALDACTONE) 25 MG tablet Take 0.5 tablets (12.5 mg total) by mouth daily. KEEP OV.   Current Facility-Administered Medications for the 12/22/22 encounter (Office Visit) with Duke Salvia, MD  Medication   Study - VICTORION-1 PREVENT - inclisiran 300 mg/1.46mL or placebo SQ injection (PI-Stuckey)    Allergies:  Allergies  Allergen Reactions   Amoxicillin Rash        ROS:  Please see the history of present illness.     All other systems reviewed and negative.   Physical Examination  BP 134/70 (BP Location: Left Arm, Patient Position: Sitting, Cuff Size: Large)   Pulse (!) 54   Ht 6\' 3"  (1.905 m)   Wt 186 lb 3.2 oz (84.5 kg)   SpO2 97%   BMI 23.27 kg/m  Well developed and well nourished in no acute distress HENT normal Neck supple Clear Device pocket well healed; without  hematoma or erythema.  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  Likely related to the fact that he is lost so much weight.  Wound looks fine.  Still struggling with this difficulty swallowing.  Endoscopy was thankfully clear.  Colonoscopy is pending but I wonder whether this is not a more functional thing.  Have reached out to Dr. Servando Snare to let him know I asked the patient to call back to consider other reasons for dysphagia  His office will reach out to the patient        Sherryl Manges

## 2022-12-22 NOTE — Telephone Encounter (Signed)
Can you set this patient up for a colonoscopy and get an barium swallow since he is not better with dilation.   Weight loss Dysphagia

## 2022-12-22 NOTE — Patient Instructions (Signed)
Medication Instructions:  The current medical regimen is effective;  continue present plan and medications.  *If you need a refill on your cardiac medications before your next appointment, please call your pharmacy*   Follow-Up: At [facility], you and your health needs are our priority.  As part of our continuing mission to provide you with exceptional heart care, we have created designated Provider Care Teams.  These Care Teams include your primary Cardiologist (physician) and Advanced Practice Providers (APPs -  Physician Assistants and Nurse Practitioners) who all work together to provide you with the care you need, when you need it.  We recommend signing up for the patient portal called "MyChart".  Sign up information is provided on this After Visit Summary.  MyChart is used to connect with patients for Virtual Visits (Telemedicine).  Patients are able to view lab/test results, encounter notes, upcoming appointments, etc.  Non-urgent messages can be sent to your provider as well.   To learn more about what you can do with MyChart, go to [email or web address].    Your next appointment:   Keep scheduled appointment with Dr.End.

## 2022-12-26 ENCOUNTER — Encounter: Payer: Self-pay | Admitting: Physician Assistant

## 2022-12-26 ENCOUNTER — Ambulatory Visit: Payer: BC Managed Care – PPO | Admitting: Physician Assistant

## 2022-12-26 VITALS — BP 112/67 | HR 59 | Ht 75.0 in | Wt 188.6 lb

## 2022-12-26 DIAGNOSIS — R634 Abnormal weight loss: Secondary | ICD-10-CM | POA: Diagnosis not present

## 2022-12-26 DIAGNOSIS — R2241 Localized swelling, mass and lump, right lower limb: Secondary | ICD-10-CM | POA: Diagnosis not present

## 2022-12-26 DIAGNOSIS — K222 Esophageal obstruction: Secondary | ICD-10-CM

## 2022-12-26 DIAGNOSIS — R1310 Dysphagia, unspecified: Secondary | ICD-10-CM | POA: Insufficient documentation

## 2022-12-26 NOTE — Assessment & Plan Note (Addendum)
Chronic per patient - reports this has been ongoing for about 4 months and has led to dietary changes and concerning weight loss He was seen by GI and had endoscopy 11/29/22 but reports limited improvement in symptoms Will send order for swallow study- may need to be referred to speech for further evaluation and potential management Will also check CMP, CBC, Lipase, amylase, TSH, T4  for further rule out- Results to dictate further management  May benefit from changes to acid treatments but will wait on test results before adjusting  Recommend continued follow up with GI and PCP office for monitoring

## 2022-12-26 NOTE — Progress Notes (Signed)
Acute Office Visit   Patient: [name]   DOB: 03/09/1961   62 y.o. Male  MRN: [id number] Visit Date: 12/26/2022  Today's healthcare provider: Oswaldo Conroy Jocelyn Lowery, PA-C  Introduced myself to the patient as a Secondary school teacher and provided education on APPs in clinical practice.    Chief Complaint  Patient presents with   Dysphagia    Patient says he recently Endoscopy and he has been having issues and pain in the area since the procedure. Patient says his Cardiologist reached out to Dr Annabell Sabal office. Patient says he did not reach out to the Gastroenterologist office since this issue started. Patient says he stays hoarse all the time and says he has pain when swallowing.    Cyst    Patient has noticed a knot in his R thigh area. Patient says some days, he will have some soreness.    Subjective    HPI HPI     Dysphagia    Additional comments: Patient says he recently Endoscopy and he has been having issues and pain in the area since the procedure. Patient says his Cardiologist reached out to Dr Annabell Sabal office. Patient says he did not reach out to the Gastroenterologist office since this issue started. Patient says he stays hoarse all the time and says he has pain when swallowing.         Cyst    Additional comments: Patient has noticed a knot in his R thigh area. Patient says some days, he will have some soreness.       Last edited by Malen Gauze, CMA on 12/26/2022  9:06 AM.       Dysphagia  He states he has had issues with swallowing - feels like food gets stuck in his throat  He also reports hoarseness with voice  He reports he has lost about 90 lbs (reports after annual physical he got up to 260 lbs?)  Reports he is not able to tolerate much more than liquid intake- states solids have to be accompanied by water   Onset: gradual  Duration: Ongoing for about 4 months  Interventions: He takes Pepcid and Prilosec daily. Has had to adjust diet to prevent symptoms   Per chart  review since his physical in 12/2021: He has lost almost 20 lbs since April. Weight at yearly physical was 226 lbs almost one year ago (has lost almost 40 lbs) - cannot find weight over 226 from then until now It appears he was at 220 lbs on 04/12/22 then 201 lbs on 07/12/22 then down to 186 lbs on 11/24/22   He has had endoscopy and esophageal stretching with GI on 11/29/22 - he has not had any follow up with GI since the procedure  Most recent colonoscopy was 2022- recommended repeat in 5 years   He states he has a pain in the right upper portion of his back - states it feels like something is stabbing him  He also has concerns for a mass on his right upper thigh that he reports is getting larger in size and hurts when he is active/ walking for extended periods    Medications: Outpatient Medications Prior to Visit  Medication Sig   alendronate (FOSAMAX) 70 MG tablet Take 1 tablet (70 mg total) by mouth every 7 (seven) days. Take with a full glass of water on an empty stomach.   aspirin 81 MG chewable tablet Chew 81 mg by mouth daily.  atorvastatin (LIPITOR) 40 MG tablet Take 1 tablet (40 mg total) by mouth daily. Please keep upcoming appointment for future refills. Thank you.   empagliflozin (JARDIANCE) 10 MG TABS tablet TAKE 1 TABLET BY MOUTH ONCE EVERY MORNING WITH BREAKFAST   famotidine (PEPCID) 20 MG tablet TAKE 1 TABLET BY MOUTH 1 HOUR BEFORE BEDTIME   metoprolol tartrate (LOPRESSOR) 25 MG tablet Take 1 tablet (25 mg total) by mouth 2 (two) times daily.   omeprazole (PRILOSEC) 20 MG capsule TAKE 1 CAPSULE BY MOUTH TWICE DAILY BEFORE A MEAL   oxybutynin (DITROPAN-XL) 10 MG 24 hr tablet TAKE 1 TABLET BY MOUTH ONCE DAILY   sacubitril-valsartan (ENTRESTO) 24-26 MG Take 1 tablet by mouth 2 (two) times daily. Please keep scheduled appointment for future refills. Thank you.   spironolactone (ALDACTONE) 25 MG tablet Take 0.5 tablets (12.5 mg total) by mouth daily. KEEP OV.   Facility-Administered  Medications Prior to Visit  Medication Dose Route Frequency Provider   Study - VICTORION-1 PREVENT - inclisiran 300 mg/1.2mL or placebo SQ injection (PI-Stuckey)  300 mg Subcutaneous Q6 months     Review of Systems  Constitutional:  Positive for unexpected weight change. Negative for chills, fatigue and fever.  HENT:  Positive for trouble swallowing.   Musculoskeletal:  Positive for back pain.        Objective    BP 112/67   Pulse (!) 59   Ht 6\' 3"  (1.905 m)   Wt 188 lb 9.6 oz (85.5 kg)   SpO2 98%   BMI 23.57 kg/m     Physical Exam Vitals reviewed.  Constitutional:      General: He is awake.     Appearance: Normal appearance. He is well-developed and well-groomed.  HENT:     Head: Normocephalic and atraumatic.  Cardiovascular:     Rate and Rhythm: Normal rate and regular rhythm.     Heart sounds: Normal heart sounds.  Pulmonary:     Effort: Pulmonary effort is normal.     Breath sounds: Normal breath sounds. No decreased air movement. No decreased breath sounds, wheezing, rhonchi or rales.  Abdominal:     General: Abdomen is flat. Bowel sounds are normal.     Palpations: Abdomen is soft.  Musculoskeletal:       Legs:  Neurological:     Mental Status: He is alert.  Psychiatric:        Behavior: Behavior is cooperative.       No results found for any visits on 12/26/22.  Assessment & Plan      No follow-ups on file.       Problem List Items Addressed This Visit       Digestive   Stricture and stenosis of esophagus    Patient recently underwent endoscopy and esophageal stretching 11/29/22 and reports minimal improvement in dysphagia Recommend further follow up with GI and speech therapy after swallow study is conducted given dietary changes and weight loss Referrals placed for GI and swallow study ordered today- results of testing to further dictate management       Relevant Orders   SLP modified barium swallow   Ambulatory referral to  Gastroenterology   Dysphagia - Primary    Chronic per patient - reports this has been ongoing for about 4 months and has led to dietary changes and concerning weight loss He was seen by GI and had endoscopy 11/29/22 but reports limited improvement in symptoms Will send order for swallow study- may need to be  referred to speech for further evaluation and potential management Will also check CMP, CBC, Lipase, amylase, TSH, T4  for further rule out- Results to dictate further management  May benefit from changes to acid treatments but will wait on test results before adjusting  Recommend continued follow up with GI and PCP office for monitoring        Relevant Orders   SLP modified barium swallow   Comp Met (CMET)   CBC w/Diff   Lipase   Amylase   TSH   T4, free   Ambulatory referral to Gastroenterology     Other   Loss of weight   Relevant Orders   SLP modified barium swallow   Comp Met (CMET)   CBC w/Diff   Lipase   Amylase   TSH   T4, free   Ambulatory referral to Gastroenterology   Mass of right thigh    Chronic, per patient, ongoing 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   I, Labrittany Wechter E Ianna Salmela, PA-C, have reviewed all documentation for this visit. The documentation on 12/26/22 for the exam, diagnosis, procedures, and orders are all accurate and complete.   Jacquelin Hawking, MHS, PA-C [facility]

## 2022-12-26 NOTE — Assessment & Plan Note (Signed)
Patient recently underwent endoscopy and esophageal stretching 11/29/22 and reports minimal improvement in dysphagia Recommend further follow up with GI and speech therapy after swallow study is conducted given dietary changes and weight loss Referrals placed for GI and swallow study ordered today- results of testing to further dictate management

## 2022-12-26 NOTE — Assessment & Plan Note (Signed)
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

## 2022-12-27 ENCOUNTER — Other Ambulatory Visit: Payer: Self-pay

## 2022-12-27 ENCOUNTER — Ambulatory Visit
Admission: RE | Admit: 2022-12-27 | Discharge: 2022-12-27 | Disposition: A | Discharge status: Home / Self Care | Payer: BC Managed Care – PPO | Source: Ambulatory Visit | Attending: Physician Assistant | Admitting: Physician Assistant

## 2022-12-27 DIAGNOSIS — R634 Abnormal weight loss: Secondary | ICD-10-CM

## 2022-12-27 DIAGNOSIS — R2241 Localized swelling, mass and lump, right lower limb: Secondary | ICD-10-CM | POA: Diagnosis not present

## 2022-12-27 DIAGNOSIS — R1319 Other dysphagia: Secondary | ICD-10-CM

## 2022-12-27 LAB — COMPREHENSIVE METABOLIC PANEL
ALT: 7 IU/L (ref 0–44)
AST: 14 IU/L (ref 0–40)
Albumin: 4.1 g/dL (ref 3.9–4.9)
Alkaline Phosphatase: 58 IU/L (ref 44–121)
BUN/Creatinine Ratio: 17 (ref 10–24)
BUN: 14 mg/dL (ref 8–27)
Bilirubin Total: 0.6 mg/dL (ref 0.0–1.2)
CO2: 24 mmol/L (ref 20–29)
Calcium: 9.3 mg/dL (ref 8.6–10.2)
Chloride: 104 mmol/L (ref 96–106)
Creatinine, Ser: 0.82 mg/dL (ref 0.76–1.27)
Globulin, Total: 2 g/dL (ref 1.5–4.5)
Glucose: 88 mg/dL (ref 70–99)
Potassium: 4.2 mmol/L (ref 3.5–5.2)
Sodium: 140 mmol/L (ref 134–144)
Total Protein: 6.1 g/dL (ref 6.0–8.5)
eGFR: 98 mL/min/{1.73_m2} (ref >59)

## 2022-12-27 LAB — AMYLASE: Amylase: 41 U/L (ref 31–110)

## 2022-12-27 LAB — CBC WITH DIFFERENTIAL/PLATELET
Basophils Absolute: 0 10*3/uL (ref 0.0–0.2)
Basos: 1 %
EOS (ABSOLUTE): 0.1 10*3/uL (ref 0.0–0.4)
Eos: 1 %
Hematocrit: 45.8 % (ref 37.5–51.0)
Hemoglobin: 15 g/dL (ref 13.0–17.7)
Immature Grans (Abs): 0 10*3/uL (ref 0.0–0.1)
Immature Granulocytes: 0 %
Lymphocytes Absolute: 1.3 10*3/uL (ref 0.7–3.1)
Lymphs: 22 %
MCH: 30.6 pg (ref 26.6–33.0)
MCHC: 32.8 g/dL (ref 31.5–35.7)
MCV: 94 fL (ref 79–97)
Monocytes Absolute: 0.7 10*3/uL (ref 0.1–0.9)
Monocytes: 12 %
Neutrophils Absolute: 3.8 10*3/uL (ref 1.4–7.0)
Neutrophils: 64 %
Platelets: 263 10*3/uL (ref 150–450)
RBC: 4.9 x10E6/uL (ref 4.14–5.80)
RDW: 12.8 % (ref 11.6–15.4)
WBC: 5.9 10*3/uL (ref 3.4–10.8)

## 2022-12-27 LAB — T4, FREE: Free T4: 1.11 ng/dL (ref 0.82–1.77)

## 2022-12-27 LAB — TSH: TSH: 0.801 u[IU]/mL (ref 0.450–4.500)

## 2022-12-27 LAB — LIPASE: Lipase: 19 U/L (ref 13–78)

## 2022-12-27 MED ORDER — NA SULFATE-K SULFATE-MG SULF 17.5-3.13-1.6 GM/177ML PO SOLN: 1.0000 | Freq: Once | ORAL | 0 refills | Status: AC

## 2022-12-27 NOTE — Telephone Encounter (Signed)
Colonoscopy scheduled.

## 2022-12-28 ENCOUNTER — Telehealth: Payer: Self-pay

## 2022-12-28 NOTE — Telephone Encounter (Signed)
Clearance faxed to Dr Graciela Husbands x 2

## 2022-12-29 ENCOUNTER — Telehealth: Payer: Self-pay

## 2022-12-29 ENCOUNTER — Other Ambulatory Visit: Payer: Self-pay | Admitting: Physician Assistant

## 2022-12-29 DIAGNOSIS — R1310 Dysphagia, unspecified: Secondary | ICD-10-CM

## 2022-12-29 DIAGNOSIS — R634 Abnormal weight loss: Secondary | ICD-10-CM

## 2022-12-29 DIAGNOSIS — K222 Esophageal obstruction: Secondary | ICD-10-CM

## 2022-12-29 NOTE — Telephone Encounter (Signed)
Left voicemail to discuss request for cardiac risk assessment for colonoscopy scheduled 01/18/23.   Regarding ASA therapy, we recommend continuation of ASA throughout the perioperative period. However, if the surgeon feels that cessation of ASA is required in the perioperative period secondary to high risk for bleeding it may be stopped 5-7 days prior to surgery with a plan to resume it as soon as felt to be feasible from a surgical standpoint in the post-operative period.

## 2022-12-29 NOTE — Telephone Encounter (Signed)
[name]...  Pre-operative Risk Assessment    Patient Name: [name]  DOB: Sep 09, 1960 MRN: [id number]    LAST O/V 12/22/22  Request for Surgical Clearance    Procedure:   COLONOSCOPY  Date of Surgery: 01/18/23                                 Surgeon:  Loreli Slot Surgeon's Group or Practice Name:  [facility] Phone number:  [phone] Fax number:  [phone]   Type of Clearance Requested:   - Medical  - Pharmacy:  Hold Aspirin     Type of Anesthesia:  General    Additional requests/questions:    SignedRenee Ramus   12/29/2022, 2:22 PM

## 2022-12-29 NOTE — Telephone Encounter (Signed)
Name: [name]  DOB: 01/10/1961  MRN: [id number]   Primary Cardiologist: None Surgeon's Group or Practice Name:  [facility] Phone number:  [phone] Fax number:  [phone] Procedure:   COLONOSCOPY Date of Surgery: 01/18/23   Chart reviewed as part of pre-operative protocol coverage. [name] was last seen on 12/22/22 by Dr. Graciela Husbands. He was doing well at that time. Patient was called today to discuss cardiac risk assessment, he reports he is doing well. He denies chest pain, palpitations, dyspnea, pnd, orthopnea, dizziness, syncope, or edema. He is able to achieve greater than 4 METs of activity.   Therefore, based on ACC/AHA guidelines, the patient would be at acceptable risk for the planned procedure without further cardiovascular testing.   Regarding ASA therapy, we recommend continuation of ASA throughout the perioperative period. However, if the surgeon feels that cessation of ASA is required in the perioperative period secondary to high risk for bleeding it may be stopped 5-7 days prior to surgery with a plan to resume it as soon as felt to be feasible from a surgical standpoint in the post-operative period.   I will route this recommendation to the requesting party via Epic fax function and remove from pre-op pool. Please call with questions.  Rip Harbour, NP 12/29/2022, 4:57 PM

## 2023-01-02 ENCOUNTER — Other Ambulatory Visit: Payer: Self-pay | Admitting: Nurse Practitioner

## 2023-01-03 ENCOUNTER — Ambulatory Visit
Admission: RE | Admit: 2023-01-03 | Discharge: 2023-01-03 | Disposition: A | Discharge status: Home / Self Care | Payer: BC Managed Care – PPO | Source: Ambulatory Visit | Attending: Gastroenterology | Admitting: Gastroenterology

## 2023-01-03 ENCOUNTER — Other Ambulatory Visit: Payer: Self-pay | Admitting: Internal Medicine

## 2023-01-03 ENCOUNTER — Other Ambulatory Visit: Payer: Self-pay | Admitting: Gastroenterology

## 2023-01-03 DIAGNOSIS — R1310 Dysphagia, unspecified: Secondary | ICD-10-CM | POA: Diagnosis not present

## 2023-01-03 DIAGNOSIS — R1319 Other dysphagia: Secondary | ICD-10-CM | POA: Insufficient documentation

## 2023-01-03 DIAGNOSIS — R634 Abnormal weight loss: Secondary | ICD-10-CM

## 2023-01-03 DIAGNOSIS — K449 Diaphragmatic hernia without obstruction or gangrene: Secondary | ICD-10-CM | POA: Diagnosis not present

## 2023-01-03 NOTE — Telephone Encounter (Signed)
Requested Prescriptions  Pending Prescriptions Disp Refills   alendronate (FOSAMAX) 70 MG tablet [Pharmacy Med Name: ALENDRONATE SODIUM 70 MG TAB] 12 tablet 3    Sig: TAKE ONE TABLET BY MOUTH EACH WEEK, ON AN EMPTY STOMACH BEFORE BREAKFAST WITH 8oz OF WATER AND REMAIN UPRIGHT FOR :30     Endocrinology:  Bisphosphonates Failed - 01/02/2023  4:59 PM      Failed - Vitamin D in normal range and within 360 days    No results found for: "JX9147WG9", "FA2130QM5", "HQ469GE9BMW", "25OHVITD3", "25OHVITD2", "25OHVITD1", "VD25OH"       Failed - Mg Level in normal range and within 360 days    Magnesium  Date Value Ref Range Status  10/16/2013 2.0 mg/dL Final    Comment:    4.1-3.2 THERAPEUTIC RANGE: 4-7 mg/dL TOXIC: > 10 mg/dL  -----------------------          Failed - Phosphate in normal range and within 360 days    No results found for: "PHOS"       Passed - Ca in normal range and within 360 days    Calcium  Date Value Ref Range Status  12/26/2022 9.3 8.6 - 10.2 mg/dL Final   Calcium, Total  Date Value Ref Range Status  10/16/2013 8.9 8.5 - 10.1 mg/dL Final         Passed - Cr in normal range and within 360 days    Creatinine  Date Value Ref Range Status  10/16/2013 0.81 0.60 - 1.30 mg/dL Final   Creatinine, Ser  Date Value Ref Range Status  12/26/2022 0.82 0.76 - 1.27 mg/dL Final         Passed - eGFR is 30 or above and within 360 days    EGFR (African American)  Date Value Ref Range Status  10/16/2013 >60  Final   GFR calc Af Amer  Date Value Ref Range Status  10/17/2013 >90 >90 mL/min Final    Comment:    (NOTE) The eGFR has been calculated using the CKD EPI equation. This calculation has not been validated in all clinical situations. eGFR's persistently <90 mL/min signify possible Chronic Kidney Disease.   EGFR (Non-African Amer.)  Date Value Ref Range Status  10/16/2013 >60  Final    Comment:    eGFR values <31mL/min/1.73 m2 may be an indication of  chronic kidney disease (CKD). Calculated eGFR is useful in patients with stable renal function. The eGFR calculation will not be reliable in acutely ill patients when serum creatinine is changing rapidly. It is not useful in  patients on dialysis. The eGFR calculation may not be applicable to patients at the low and high extremes of body sizes, pregnant women, and vegetarians.    GFR, Estimated  Date Value Ref Range Status  01/05/2022 >60 >60 mL/min Final    Comment:    (NOTE) Calculated using the CKD-EPI Creatinine Equation (2021)    GFR  Date Value Ref Range Status  12/03/2013 120.46 >60.00 mL/min Final   eGFR  Date Value Ref Range Status  12/26/2022 98 >59 mL/min/1.73 Final         Passed - Valid encounter within last 12 months    Recent Outpatient Visits           1 week ago Dysphagia, unspecified type   [place] [facility] Mecum, Oswaldo Conroy, PA-C   11 months ago Rib pain   [place] [facility] Larae Grooms, NP   12 months ago Encounter for annual  physical exam   [place] [facility] Mecum, Oswaldo Conroy, PA-C   1 year ago Posterior pain of left hip   [place] [facility] Mecum, Oswaldo Conroy, PA-C   1 year ago Family history of blood coagulation disorder   [place] [facility] Vigg, Avanti, MD       Future Appointments             In 1 week Janalee Dane [facility] [place]   In 2 weeks End, Cristal Deer, MD [facility]   In 1 month Larae Grooms, NP [facility]            Passed - Bone Mineral Density or Dexa Scan completed in the last 2 years

## 2023-01-04 ENCOUNTER — Encounter: Payer: Self-pay | Admitting: Gastroenterology

## 2023-01-04 ENCOUNTER — Telehealth: Payer: Self-pay | Admitting: Nurse Practitioner

## 2023-01-04 ENCOUNTER — Ambulatory Visit
Admission: RE | Admit: 2023-01-04 | Discharge: 2023-01-04 | Disposition: A | Discharge status: Home / Self Care | Payer: BC Managed Care – PPO | Source: Ambulatory Visit | Attending: Physician Assistant | Admitting: Physician Assistant

## 2023-01-04 DIAGNOSIS — R1310 Dysphagia, unspecified: Secondary | ICD-10-CM | POA: Diagnosis not present

## 2023-01-04 DIAGNOSIS — K222 Esophageal obstruction: Secondary | ICD-10-CM | POA: Diagnosis not present

## 2023-01-04 DIAGNOSIS — R634 Abnormal weight loss: Secondary | ICD-10-CM | POA: Insufficient documentation

## 2023-01-04 NOTE — Telephone Encounter (Signed)
Spoke with Natalia Leatherwood and she states that she will go ahead with the procedure today.

## 2023-01-04 NOTE — Progress Notes (Signed)
Modified Barium Swallow Study  Patient Details  Name: RUPPERT MCLENNON MRN: [id number] Date of Birth: Mar 19, 1961  Today's Date: 01/04/2023  Modified Barium Swallow completed.  Full report located under Chart Review in the Imaging Section.  History of Present Illness Pt is a 62yo male w/ medical history of Cardiac issues PSVT, CAD, tachycardia, NICM, nonischemic cardiomyopathy and a prior history of syncope; also anginal pain, GERD on PPI 20mg  BID. Pt had Covid19 w/ pneumonia in 2021 but No other dx'd/reports of pneumonia per pt or chart.  Patient recently underwent endoscopy and esophageal stretching 11/29/22 and reports minimal improvement in dysphagia.  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 Small  Hiatal Hernia".  Pt endorses "Weight Loss" in recent year.  CXR 2 years ago: No active cardiopulmonary disease.   OF NOTE: Pt has been involved in a Research Study in recent several months per chart and pt report.   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 ANY Esophageal phase deficits can impact the oropharyngeal phase of swallowing and cause discomfort along the Esophagus during oral intake.  NO aspiration nor laryngeal penetration occurred during this study.   Oral stage is characterized by adequate lip closure, bolus preparation and containment, and anterior to posterior transit. Pt was careful to fully masticate soft solids in setting of Missing some Dentition. Any slight+ lingual residue was cleared w/ f/u, dry swallow -- pt endorsed a "very dry mouth" at beginning of study. Swallow initiation occurs primarily at the level of the valleculae.   Pharyngeal stage is noted for adequate tongue base  retraction, adequate hyolaryngeal excursion, and adequate pharyngeal constriction. Pharyngeal stripping wave is complete. Epiglottic inversion is complete w/ all consistencies. No aspiration nor laryngeal penetration occurred during all trials; same noted on DG Esophagus.    Amplitude/duration of cricopharyngeus opening appeared [facility]. There was adequate/complete clearance through the upper cervical Esophagus viewable to just below the shoulders. Of note, the cricopharyngeus muscle appeared slightly prominent along posterior wall. Pt does have h/o GERD and now dx'd Esophageal phase Dysmotility(01/03/2023 - see DG Esophagus).   Pt was educated on the results of the study immediately after; viewed study together and answered questions. Discussed following general aspiration precautions including a f/u, dry swallow when needed, alternating foods/liquids during meals, chewing foods well b/f swallowing, but moreover following REFLUX/GERD Precautions as recommended by GI in setting of dx'd Esophageal phase Dysmotility and Baseline GERD. Discussed that ANY Esophageal phase Dysmotility can lead to a "stacking effect" when eating/drinking at meals which can cause discomfort in the Esophagus thus impacting swallowing and overall intake of food/liquid; pt has c/o weight loss in recent months. GERD can increase Xerostomia. Discussed both issues and rec'd f/u w/ PCP for both -- suggested discussing Nutrition Support w/ PCP such as a Drink Supplement(ie, [employer], Ensure, etc). Also encouraged pt to talk w/ GI and/or PCP re: his PPI dosage to manage GERD s/s. Factors that may increase risk of adverse event in presence of aspiration Rubye Oaks & Clearance Coots 2021):  (none)  Swallow Evaluation Recommendations Recommendations: PO diet PO Diet Recommendation: Regular; Thin liquids (Level 0) (meats/foods cut SMALL, moistened foods) - chew foods well to break down Liquid Administration via: Cup Medication Administration: Whole  meds with puree (for ease  of swallowing and clearing of the Esophagus as needed) Supervision: Patient able to self-feed Swallowing strategies  : Minimize environmental distractions;Slow rate;Small bites/sips;Follow solids with liquids (Dry swallow intermittently) Postural changes: Position pt fully upright for meals;Stay upright 30-60 min after meals (GERD/REFLUX precautions) Oral care recommendations: Oral care QID (4x/day);Pt independent with oral care Recommended consults: Consider ongoing GI consultation; Consider esophageal assessment - for further Education and/or tx strategies for Esophageal phase Dysmotility; Consider dietitian consultation for Nutrition support.        Jerilynn Som, MS, CCC-SLP Speech Language Pathologist Rehab Services; [place] [facility] [place] [phone] (ascom) Nate Perri 01/04/2023,5:16 PM

## 2023-01-04 NOTE — Telephone Encounter (Signed)
Copied from CRM [id number]. Topic: General - Other >> Jan 04, 2023  8:44 AM Phill Myron wrote: Please call regarding before noon Ms [name] the [facility] Speech and Language Pathologist... Regarding Patient [name]  (having a procedure TODAY)

## 2023-01-05 ENCOUNTER — Encounter: Payer: Self-pay | Admitting: Physician Assistant

## 2023-01-05 DIAGNOSIS — R2241 Localized swelling, mass and lump, right lower limb: Secondary | ICD-10-CM

## 2023-01-11 ENCOUNTER — Ambulatory Visit: Payer: BC Managed Care – PPO | Admitting: Physician Assistant

## 2023-01-12 NOTE — Progress Notes (Signed)
Your ultrasound results are back and appeared consistent with a lipoma but due to the concerns of increasing size, radiology recommends that you have a MRI with contrast for more in-depth evaluation. Given the placement and size I would recommend that we get the imaging and refer you to General Surgery for evaluation for removal. Let us know how you would like to proceed.

## 2023-01-17 ENCOUNTER — Encounter: Payer: Self-pay | Admitting: Gastroenterology

## 2023-01-18 ENCOUNTER — Ambulatory Visit: Payer: BC Managed Care – PPO | Admitting: Anesthesiology

## 2023-01-18 ENCOUNTER — Encounter
Admission: RE | Disposition: A | Discharge status: Home / Self Care | Payer: Self-pay | Source: Home / Self Care | Attending: Gastroenterology

## 2023-01-18 ENCOUNTER — Other Ambulatory Visit: Payer: Self-pay

## 2023-01-18 ENCOUNTER — Encounter: Payer: Self-pay | Admitting: Gastroenterology

## 2023-01-18 ENCOUNTER — Ambulatory Visit
Admission: RE | Admit: 2023-01-18 | Discharge: 2023-01-18 | Disposition: A | Discharge status: Home / Self Care | Payer: BC Managed Care – PPO | Attending: Gastroenterology | Admitting: Gastroenterology

## 2023-01-18 DIAGNOSIS — I5022 Chronic systolic (congestive) heart failure: Secondary | ICD-10-CM | POA: Insufficient documentation

## 2023-01-18 DIAGNOSIS — R634 Abnormal weight loss: Secondary | ICD-10-CM | POA: Diagnosis not present

## 2023-01-18 DIAGNOSIS — K640 First degree hemorrhoids: Secondary | ICD-10-CM | POA: Insufficient documentation

## 2023-01-18 DIAGNOSIS — Z6821 Body mass index (BMI) 21.0-21.9, adult: Secondary | ICD-10-CM | POA: Insufficient documentation

## 2023-01-18 DIAGNOSIS — I509 Heart failure, unspecified: Secondary | ICD-10-CM | POA: Diagnosis not present

## 2023-01-18 DIAGNOSIS — I2510 Atherosclerotic heart disease of native coronary artery without angina pectoris: Secondary | ICD-10-CM | POA: Diagnosis not present

## 2023-01-18 DIAGNOSIS — K5730 Diverticulosis of large intestine without perforation or abscess without bleeding: Secondary | ICD-10-CM | POA: Insufficient documentation

## 2023-01-18 DIAGNOSIS — I110 Hypertensive heart disease with heart failure: Secondary | ICD-10-CM | POA: Insufficient documentation

## 2023-01-18 DIAGNOSIS — K219 Gastro-esophageal reflux disease without esophagitis: Secondary | ICD-10-CM | POA: Diagnosis not present

## 2023-01-18 HISTORY — PX: COLONOSCOPY WITH PROPOFOL: SHX5780

## 2023-01-18 SURGERY — COLONOSCOPY WITH PROPOFOL
Anesthesia: General

## 2023-01-18 MED ORDER — EPHEDRINE SULFATE (PRESSORS) 50 MG/ML IJ SOLN
INTRAMUSCULAR | Status: DC | PRN
  Administered 2023-01-18: 5 mg via INTRAVENOUS

## 2023-01-18 MED ORDER — PROPOFOL 500 MG/50ML IV EMUL
INTRAVENOUS | Status: DC | PRN
  Administered 2023-01-18: 165 ug/kg/min via INTRAVENOUS

## 2023-01-18 MED ORDER — SODIUM CHLORIDE 0.9 % IV SOLN
INTRAVENOUS | Status: DC | PRN
Start: 2023-01-18 — End: 2023-01-18

## 2023-01-18 MED ORDER — PROPOFOL 10 MG/ML IV BOLUS
INTRAVENOUS | Status: DC | PRN
  Administered 2023-01-18: 40 mg via INTRAVENOUS
  Administered 2023-01-18: 70 mg via INTRAVENOUS

## 2023-01-18 MED ORDER — PHENYLEPHRINE 80 MCG/ML (10ML) SYRINGE FOR IV PUSH (FOR BLOOD PRESSURE SUPPORT)
PREFILLED_SYRINGE | INTRAVENOUS | Status: DC | PRN
  Administered 2023-01-18 (×2): 160 ug via INTRAVENOUS

## 2023-01-18 MED ORDER — SODIUM CHLORIDE 0.9 % IV SOLN: INTRAVENOUS | Status: DC

## 2023-01-18 MED ORDER — LIDOCAINE HCL (CARDIAC) PF 100 MG/5ML IV SOSY
PREFILLED_SYRINGE | INTRAVENOUS | Status: DC | PRN
  Administered 2023-01-18: 100 mg via INTRAVENOUS

## 2023-01-18 NOTE — Anesthesia Postprocedure Evaluation (Signed)
Anesthesia Post Note  Patient: [name]  Procedure(s) Performed: COLONOSCOPY WITH PROPOFOL  Patient location during evaluation: Endoscopy Anesthesia Type: General Level of consciousness: awake and alert Pain management: pain level controlled Vital Signs Assessment: post-procedure vital signs reviewed and stable Respiratory status: spontaneous breathing, nonlabored ventilation, respiratory function stable and patient connected to nasal cannula oxygen Cardiovascular status: blood pressure returned to baseline and stable Postop Assessment: no apparent nausea or vomiting Anesthetic complications: no   No notable events documented.   Last Vitals:  Vitals:   01/18/23 0812 01/18/23 0832  BP: 101/68 112/76  Pulse: 83 70  Resp: 17 18  Temp: (!) 36.1 C   SpO2: 100% 100%    Last Pain:  Vitals:   01/18/23 0832  TempSrc:   PainSc: 0-No pain                 Lenard Simmer

## 2023-01-18 NOTE — Transfer of Care (Signed)
Immediate Anesthesia Transfer of Care Note  Patient: [name]  Procedure(s) Performed: COLONOSCOPY WITH PROPOFOL  Patient Location: Endoscopy Unit  Anesthesia Type:General  Level of Consciousness: awake, drowsy, and patient cooperative  Airway & Oxygen Therapy: Patient Spontanous Breathing and Patient connected to face mask oxygen  Post-op Assessment: Report given to RN and Post -op Vital signs reviewed and stable  Post vital signs: Reviewed and stable  Last Vitals:  Vitals Value Taken Time  BP 101/68 01/18/23 0812  Temp 36.1 C 01/18/23 0812  Pulse 82 01/18/23 0813  Resp 16 01/18/23 0813  SpO2 100 % 01/18/23 0813  Vitals shown include unfiled device data.  Last Pain:  Vitals:   01/18/23 0812  TempSrc: Temporal  PainSc: Asleep         Complications: No notable events documented.

## 2023-01-18 NOTE — H&P (Signed)
Brent Minium, MD [facility] [address], [address] [place], [place] [address] Phone:[phone] Fax : [phone]  Primary Care Physician:  Larae Grooms, NP Primary Gastroenterologist:  Dr. Servando Snare  Pre-Procedure History & Physical: HPI:  [name] is a 62 y.o. male is here for an colonoscopy.   Past Medical History:  Diagnosis Date   Anginal pain (HCC) 10/16/2013   Aortic atherosclerosis (HCC) 11/16/2020   Ascending aorta dilatation (HCC) 09/29/2020   a.) TTE 09/29/2020: Ao root 37 mm, asc Ao 39 mm, Ao arch 33 mm; b.) TTE 08/06/2021: Ao root 37 mm   CAD (coronary artery disease) 10/17/2013   a.) LHC 10/17/2013: EF 65%. 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) 09/29/2020   a.) TTE 09/29/2020: EF 45-50%, sev bas-mid inferior HK, GLS -15.5%, mild MR, G1DD; b.) TTE 08/06/2021: EF 45-50%, no RWMAs, GLS -16.5%, mild MR, G2DD   History of 2019 novel coronavirus disease (COVID-19) 01/2020   Hypertension 1976   only on meds for a year   Implantable loop recorder 12/09/2013   a.) Medtronic ILR device   NICM (nonischemic cardiomyopathy) (HCC)    a.) TTE 10/08/2014: EF 50-55%; b.) TTE 09/29/2020: EF 45-50%; c.) TTE 08/06/2021: EF 45-50%   Nonsustained ventricular tachycardia (HCC) 05/2014   a.) noted on ILR in 05/2014; b.) holter 10/2021: NSVT x 2 episodes; longest lasted 6 beats at a rate of 197 bpm   Pneumonia due to COVID-19 virus 01/2020   a.) hospitalized and Tx'd with MAB infusions   Prostatism 01/2022   surgery - benign   PSVT (paroxysmal supraventricular tachycardia) (HCC) 10/2021   a.) holter 10/2021: 148 SVT episodes with max HR 226 bpm   Sinus tachycardia     Syncope 10/16/2013    Past Surgical History:  Procedure Laterality Date   COLONOSCOPY WITH PROPOFOL N/A 11/24/2020   Procedure: COLONOSCOPY WITH PROPOFOL;  Surgeon: Brent Minium, MD;  Location: [facility] ENDOSCOPY;  Service: Endoscopy;  Laterality: N/A;   ESOPHAGEAL DILATION  11/29/2022   Procedure: ESOPHAGEAL DILATION;  Surgeon: Brent Minium, MD;  Location: [facility] ENDOSCOPY;  Service: Endoscopy;;   ESOPHAGOGASTRODUODENOSCOPY (EGD) WITH PROPOFOL N/A 11/29/2022   Procedure: ESOPHAGOGASTRODUODENOSCOPY (EGD) WITH PROPOFOL;  Surgeon: Brent Minium, MD;  Location: [facility] ENDOSCOPY;  Service: Endoscopy;  Laterality: N/A;   LEFT HEART CATHETERIZATION WITH CORONARY ANGIOGRAM N/A 10/17/2013   Procedure: LEFT HEART CATHETERIZATION WITH CORONARY ANGIOGRAM;  Surgeon: Kathleene Hazel, MD;  Location: [facility] CATH LAB;  Service: Cardiovascular;  Laterality: N/A;   LOOP RECORDER IMPLANT N/A 12/09/2013   Procedure: LOOP RECORDER IMPLANT;  Surgeon: Duke Salvia, MD;  Location: [facility] CATH LAB;  Service: Cardiovascular;  Laterality: N/A;   TRANSURETHRAL RESECTION OF PROSTATE N/A 01/10/2022   Procedure: TRANSURETHRAL RESECTION OF THE PROSTATE (TURP);  Surgeon: Vanna Scotland, MD;  Location: [facility] ORS;  Service: Urology;  Laterality: N/A;    Prior to Admission medications   Medication Sig Start Date End Date Taking? Authorizing Provider  aspirin 81 MG chewable tablet Chew 81 mg by mouth daily.   Yes [provider]  atorvastatin (LIPITOR) 40 MG tablet Take 1 tablet (40 mg total) by mouth daily. Please keep upcoming appointment for future refills.  Thank you. 11/24/22  Yes Duke Salvia, MD  famotidine (PEPCID) 20 MG tablet TAKE 1 TABLET BY MOUTH 1 HOUR BEFORE BEDTIME 02/21/22  Yes Nyoka Cowden, MD  JARDIANCE 10 MG TABS tablet TAKE 1 TABLET BY MOUTH ONCE EVERY MORNING WITH BREAKFAST 01/03/23  Yes Duke Salvia, MD  metoprolol tartrate (LOPRESSOR) 25 MG tablet Take 1 tablet (25 mg total) by mouth 2 (two) times  daily. 08/04/22  Yes Duke Salvia, MD  omeprazole (PRILOSEC) 20 MG capsule TAKE 1 CAPSULE BY MOUTH TWICE DAILY BEFORE A MEAL 05/12/22  Yes Larae Grooms, NP  oxybutynin (DITROPAN-XL) 10 MG 24 hr tablet TAKE 1 TABLET BY MOUTH ONCE DAILY 12/08/22  Yes Vaillancourt, Samantha, PA-C  sacubitril-valsartan (ENTRESTO) 24-26 MG Take 1 tablet by mouth 2 (two) times daily. Please keep scheduled appointment for future refills. Thank you. 11/24/22  Yes Duke Salvia, MD  spironolactone (ALDACTONE) 25 MG tablet Take 0.5 tablets (12.5 mg total) by mouth daily. KEEP OV. 11/24/22  Yes Duke Salvia, MD  alendronate (FOSAMAX) 70 MG tablet TAKE ONE TABLET BY MOUTH EACH WEEK, ON AN EMPTY STOMACH BEFORE BREAKFAST WITH 8oz OF WATER AND REMAIN UPRIGHT FOR :30 01/03/23   Mecum, Erin E, PA-C    Allergies as of 12/27/2022 - Review Complete 12/26/2022  Allergen Reaction Noted   Amoxicillin Rash 10/16/2013    Family History  Problem Relation Age of Onset   Hypertension Mother    Arrhythmia Mother    Heart disease Mother    Parkinson's disease Mother    Alzheimer's disease Mother    Deep vein thrombosis Father    Colon cancer Father    Sudden death Maternal Grandfather 15   ADD / ADHD Paternal Aunt    Deep vein thrombosis Paternal Aunt    Deep vein thrombosis Paternal Uncle     Social History   Socioeconomic History   Marital status: Significant Other    Spouse name: JUDY   Number of children: Not on file   Years of education: Not on file   Highest education level: Bachelor's degree (e.g., BA, AB, BS)  Occupational History   Not on file  Tobacco Use   Smoking status: Never    Passive exposure: Never   Smokeless tobacco: Never  Vaping Use   Vaping status: Never Used  Substance and Sexual Activity   Alcohol use: No   Drug use: No   Sexual activity: Yes  Other Topics Concern   Not on file  Social History Narrative   Not on file   Social Determinants of Health   Financial Resource Strain:  Medium Risk (12/22/2022)   Overall Financial Resource Strain (CARDIA)    Difficulty of Paying Living Expenses: Somewhat hard  Food Insecurity: Food Insecurity Present (12/22/2022)   Hunger Vital Sign    Worried About Running Out of Food in the Last Year: Sometimes true    Ran Out of Food in the Last Year: Sometimes true  Transportation Needs: No Transportation Needs (12/22/2022)   PRAPARE - Administrator, Civil Service (Medical): No    Lack of Transportation (Non-Medical): No  Physical Activity: Sufficiently Active (12/22/2022)   Exercise Vital Sign    Days of Exercise per Week: 5 days    Minutes of Exercise per Session: 110 min  Stress: Stress Concern Present (12/22/2022)   [employer] of Occupational Health - Occupational Stress Questionnaire    Feeling of Stress : To some extent  Social Connections: Moderately  Integrated (12/22/2022)   Social Connection and Isolation Panel [NHANES]    Frequency of Communication with Friends and Family: More than three times a week    Frequency of Social Gatherings with Friends and Family: Once a week    Attends Religious Services: More than 4 times per year    Active Member of [employer] or Organizations: No    Attends Engineer, structural: Not on file    Marital Status: Living with partner  Intimate Partner Violence: Not on file    Review of Systems: See HPI, otherwise negative ROS  Physical Exam: BP 110/81   Pulse 83   Temp (!) 97.2 F (36.2 C) (Temporal)   Resp 20   Ht 6\' 3"  (1.905 m)   Wt 79.6 kg   SpO2 100%   BMI 21.92 kg/m  General:   Alert,  pleasant and cooperative in NAD Head:  Normocephalic and atraumatic. Neck:  Supple; no masses or thyromegaly. Lungs:  Clear throughout to auscultation.    Heart:  Regular rate and rhythm. Abdomen:  Soft, nontender and nondistended. Normal bowel sounds, without guarding, and without rebound.   Neurologic:  Alert and  oriented x4;  grossly normal  neurologically.  Impression/Plan: SOSAIA PITTINGER is here for an colonoscopy to be performed for weight loss  Risks, benefits, limitations, and alternatives regarding  colonoscopy have been reviewed with the patient.  Questions have been answered.  All parties agreeable.   Brent Minium, MD  01/18/2023, 7:51 AM

## 2023-01-18 NOTE — Anesthesia Preprocedure Evaluation (Signed)
Anesthesia Evaluation  Patient identified by MRN, date of birth, ID band Patient awake    Reviewed: Allergy & Precautions, NPO status , Patient's Chart, lab work & pertinent test results  History of Anesthesia Complications Negative for: history of anesthetic complications  Airway Mallampati: III  TM Distance: <3 FB Neck ROM: full    Dental  (+) Poor Dentition, Missing   Pulmonary neg pulmonary ROS, neg shortness of breath   Pulmonary exam normal        Cardiovascular Exercise Tolerance: Good hypertension, (-) angina + CAD and + DOE  Normal cardiovascular exam     Neuro/Psych negative neurological ROS  negative psych ROS   GI/Hepatic Neg liver ROS,GERD  Controlled,,  Endo/Other  negative endocrine ROS    Renal/GU negative Renal ROS  negative genitourinary   Musculoskeletal   Abdominal   Peds  Hematology negative hematology ROS (+)   Anesthesia Other Findings Patient reports that they do not think that any food or pills are stuck in their throat at this time.  Past Medical History: 10/16/2013: Anginal pain (HCC) 11/16/2020: Aortic atherosclerosis (HCC) 09/29/2020: Ascending aorta dilatation (HCC)     Comment:  a.) TTE 09/29/2020: Ao root 37 mm, asc Ao 39 mm, Ao arch              33 mm; b.) TTE 08/06/2021: Ao root 37 mm 10/17/2013: CAD (coronary artery disease)     Comment:  a.) LHC 10/17/2013: EF 65%. 20% mLAD - med mgmt No date: Family history of blood clots     Comment:  a.) hypercoagulable work-up 09/2021 was negative; PT               gene mutation, Factor V leiden mutation, Protein C, S,               and AT III all normal/negative 10/16/2013: Fatigue due to sleep pattern disturbance 2023: GERD (gastroesophageal reflux disease) 09/29/2020: Heart failure with mid-range ejection fraction (HFmEF)  (HCC)     Comment:  a.) TTE 09/29/2020: EF 45-50%, sev bas-mid inferior HK,               GLS -15.5%, mild  MR, G1DD; b.) TTE 08/06/2021: EF 45-50%,              no RWMAs, GLS -16.5%, mild MR, G2DD 01/2020: History of 2019 novel coronavirus disease (COVID-19) 1976: Hypertension     Comment:  only on meds for a year 12/09/2013: Implantable loop recorder     Comment:  a.) Medtronic ILR device No date: NICM (nonischemic cardiomyopathy) (HCC)     Comment:  a.) TTE 10/08/2014: EF 50-55%; b.) TTE 09/29/2020: EF               45-50%; c.) TTE 08/06/2021: EF 45-50% 05/2014: Nonsustained ventricular tachycardia (HCC)     Comment:  a.) noted on ILR in 05/2014; b.) holter 10/2021: NSVT x               2 episodes; longest lasted 6 beats at a rate of 197 bpm 01/2020: Pneumonia due to COVID-19 virus     Comment:  a.) hospitalized and Tx'd with MAB infusions 01/2022: Prostatism     Comment:  surgery - benign 10/2021: PSVT (paroxysmal supraventricular tachycardia)     Comment:  a.) holter 10/2021: 148 SVT episodes with max HR 226 bpm No date: Sinus tachycardia 10/16/2013: Syncope  Past Surgical History: 11/24/2020: COLONOSCOPY WITH PROPOFOL; N/A     Comment:  Procedure:  COLONOSCOPY WITH PROPOFOL;  Surgeon: [name], MD;  Location: [facility] ENDOSCOPY;  Service:               Endoscopy;  Laterality: N/A; 10/17/2013: LEFT HEART CATHETERIZATION WITH CORONARY ANGIOGRAM; N/A     Comment:  Procedure: LEFT HEART CATHETERIZATION WITH CORONARY               ANGIOGRAM;  Surgeon: Kathleene Hazel, MD;                Location: [facility] CATH LAB;  Service: Cardiovascular;                Laterality: N/A; 12/09/2013: LOOP RECORDER IMPLANT; N/A     Comment:  Procedure: LOOP RECORDER IMPLANT;  Surgeon: Duke Salvia, MD;  Location: [facility] CATH LAB;  Service:               Cardiovascular;  Laterality: N/A; 01/10/2022: TRANSURETHRAL RESECTION OF PROSTATE; N/A     Comment:  Procedure: TRANSURETHRAL RESECTION OF THE PROSTATE               (TURP);  Surgeon: Vanna Scotland, MD;  Location: [facility]                ORS;  Service: Urology;  Laterality: N/A;     Reproductive/Obstetrics negative OB ROS                             Anesthesia Physical Anesthesia Plan  ASA: 3  Anesthesia Plan: General   Post-op Pain Management:    Induction: Intravenous  PONV Risk Score and Plan: Propofol infusion and TIVA  Airway Management Planned: Natural Airway and Nasal Cannula  Additional Equipment:   Intra-op Plan:   Post-operative Plan:   Informed Consent: I have reviewed the patients History and Physical, chart, labs and discussed the procedure including the risks, benefits and alternatives for the proposed anesthesia with the patient or authorized representative who has indicated his/her understanding and acceptance.     Dental Advisory Given  Plan Discussed with: Anesthesiologist, CRNA and Surgeon  Anesthesia Plan Comments: (Patient consented for risks of anesthesia including but not limited to:  - adverse reactions to medications - risk of airway placement if required - damage to eyes, teeth, lips or other oral mucosa - nerve damage due to positioning  - sore throat or hoarseness - Damage to heart, brain, nerves, lungs, other parts of body or loss of life  Patient voiced understanding.)       Anesthesia Quick Evaluation

## 2023-01-18 NOTE — Anesthesia Procedure Notes (Signed)
Procedure Name: General with mask airway Date/Time: 01/18/2023 7:57 AM  Performed by: Mohammed Kindle, CRNAPre-anesthesia Checklist: Patient identified, Emergency Drugs available, Suction available and Patient being monitored Patient Re-evaluated:Patient Re-evaluated prior to induction Oxygen Delivery Method: Simple face mask Induction Type: IV induction Placement Confirmation: positive ETCO2, CO2 detector and breath sounds checked- equal and bilateral Dental Injury: Teeth and Oropharynx as per pre-operative assessment

## 2023-01-18 NOTE — Op Note (Signed)
[facility] Gastroenterology Patient Name: [name] Procedure Date: 01/18/2023 7:50 AM MRN: [id number] Account #: [id number] Date of Birth: [birth year] Admit Type: Outpatient Age: 62 Room: [facility] ENDO ROOM 1 Gender: Male Note Status: Finalized Instrument Name: Prentice Docker [id number] Procedure:             Colonoscopy Indications:           Weight loss Providers:             [name] MD, MD Referring MD:          Larae Grooms (Referring MD) Medicines:             Propofol per Anesthesia Complications:         No immediate complications. Procedure:             Pre-Anesthesia Assessment:                        - Prior to the procedure, a History and Physical was                         performed, and patient medications and allergies were                         reviewed. The patient's tolerance of previous                         anesthesia was also reviewed. The risks and benefits                         of the procedure and the sedation options and risks                         were discussed with the patient. All questions were                         answered, and informed consent was obtained. Prior                         Anticoagulants: The patient has taken no anticoagulant                         or antiplatelet agents. ASA Grade Assessment: II - A                         patient with mild systemic disease. After reviewing                         the risks and benefits, the patient was deemed in                         satisfactory condition to undergo the procedure.                        After obtaining informed consent, the colonoscope was                         passed under direct vision. Throughout the procedure,  the patient's blood pressure, pulse, and oxygen                         saturations were monitored continuously. The                         Colonoscope was introduced through the anus and                          advanced to the the cecum, identified by appendiceal                         orifice and ileocecal valve. The colonoscopy was                         performed without difficulty. The patient tolerated                         the procedure well. The quality of the bowel                         preparation was excellent. Findings:      The perianal and digital rectal examinations were normal.      Multiple small-mouthed diverticula were found in the sigmoid colon.      Non-bleeding internal hemorrhoids were found during retroflexion. The       hemorrhoids were Grade I (internal hemorrhoids that do not prolapse). Impression:            - Diverticulosis in the sigmoid colon.                        - Non-bleeding internal hemorrhoids.                        - No specimens collected. Recommendation:        - Discharge patient to home.                        - Resume previous diet. Procedure Code(s):     --- Professional ---                        [phone], Colonoscopy, flexible; diagnostic, including                         collection of specimen(s) by brushing or washing, when                         performed (separate procedure) Diagnosis Code(s):     --- Professional ---                        R63.4, Abnormal weight loss CPT copyright 2022 American Medical Association. All rights reserved. The codes documented in this report are preliminary and upon coder review may  be revised to meet current compliance requirements. [name] MD, MD 01/18/2023 8:12:10 AM This report has been signed electronically. Number of Addenda: 0 Note Initiated On: 01/18/2023 7:50 AM Scope Withdrawal Time: 0 hours 9 minutes 3 seconds  Total Procedure Duration: 0 hours 11 minutes 37 seconds  Estimated Blood Loss:  Estimated blood  loss: none.      [facility]

## 2023-01-19 ENCOUNTER — Encounter: Payer: Self-pay | Admitting: Gastroenterology

## 2023-01-22 NOTE — Progress Notes (Unsigned)
Cardiology Office Note:  .   Date:  01/23/2023  ID:  [name], DOB May 22, 1960, MRN [id number] PCP: Larae Grooms, NP  [facility] Providers Cardiologist:  [name], MD     History of Present Illness: .   Discussed the use of AI scribe software for clinical note transcription with the patient, who gave verbal consent to proceed.  [name] is a 62 y.o. male nonobstructive coronary artery disease, cardiomyopathy, PSVT, and hyperlipidemia. He has been primarily followed by Dr. Graciela Husbands in our office. Today, [name] reports that he has been feeling fairly well. He reported an unintentional weight loss of approximately 80 pounds, which was initially noticed by Dr. Graciela Husbands while his PCP was out on maternity leave. The weight loss was accompanied by difficulty swallowing, described as "a chore to eat," leading to an endoscopy and esophageal dilation. Post-procedure, the patient reported improvement in swallowing, but noted that food tends to stack up in the esophagus due to spasms before eventually passing into the stomach. [name] also reported a persistent upper back pain, described as a constant sensation of a knife sticking in the upper back region near the shoulder blade. The pain is not affected by movement or deep breathing and tends to worsen at night. The patient also reported balance issues, feeling shaky and unstable upon standing, which he attributed to possible inherited vertigo.  The patient is also participating in a study for inclisiran. During one of the study visits, he reported feeling lightheaded and had a recorded low blood pressure reading. The patient has a history of high blood pressure from a young age and is currently on several heart medications. He also reported a past episode of rapid heartbeat, which led to the discovery of heart rate issues. The patient denied any shortness of breath, chest pain, abnormal heartbeats, or fainting episodes. He also denied any  swelling or discomfort in the chest area where the loop recorder is implanted.     ROS: See HPI  Studies Reviewed: [name]   EKG Interpretation Date/Time:  Monday January 23 2023 09:29:32 EDT Ventricular Rate:  57 PR Interval:  118 QRS Duration:  102 QT Interval:  406 QTC Calculation: 395 R Axis:   -21  Text Interpretation: Sinus bradycardia Normal ECG When compared with ECG of 24-Nov-2022 11:01, No significant change was found Confirmed by Maleena Eddleman, Cristal Deer [phone]) on 01/23/2023 2:12:43 PM    Risk Assessment/Calculations:             Physical Exam:   VS:  BP 102/80 (BP Location: Left Arm, Patient Position: Sitting, Cuff Size: Normal)   Pulse (!) 57   Ht 6\' 3"  (1.905 m)   Wt 181 lb 6.4 oz (82.3 kg)   SpO2 97%   BMI 22.67 kg/m    Wt Readings from Last 3 Encounters:  01/23/23 181 lb 6.4 oz (82.3 kg)  01/18/23 175 lb 6.4 oz (79.6 kg)  12/26/22 188 lb 9.6 oz (85.5 kg)    General:  NAD. Neck: No JVD or HJR. Lungs: Clear to auscultation bilaterally without wheezes or crackles. Heart: Bradycardic but regular without murmurs, rubs, or gallops. Abdomen: Soft, nontender, nondistended. Extremities: No lower extremity edema.  ASSESSMENT AND PLAN: .    Nonobstructive coronary artery disease: No angina reported.  Continue aspirin and atorvastatin for secondary prevention.  Continue follow-up with the research clinic in the setting of participation of Victorion-1 Prevent study.  Chronic heart failure with mildly reduced ejection fraction due to nonischemic  cardiomyopathy: [name] appears euvolemic his unintentional weight loss has stabilized.  Diastolic blood pressure remains low today, preventing escalation of goal-directed medical therapy.  We will continue current doses of empagliflozin, metoprolol tartrate, Entresto, and spironolactone.  Could consider transitioning to an evidence-based beta-blocker in the future.  PSVT: No significant palpitations reported.  Continue metoprolol  tartrate and ongoing follow-up with Dr. Graciela Husbands.  Unintentional weight loss and esophageal dysmotility: 80 pound weight loss most likely driven by decreased appetite in the setting of dysphagia and esophageal dysmotility.  Symptoms have improved somewhat with esophageal dilation earlier this year.  Recommend continued famotidine and PPI use as well as ongoing follow-up with GI.  Upper back pain: Persistent issue for at least a month or two.  We discussed obtaining a chest x-ray today but have agreed to defer this.  Recommend further evaluation at upcoming PCP visit, including possible cross-sectional imaging.      Dispo: RTC 6 months.  Signed, [name], MD

## 2023-01-23 ENCOUNTER — Ambulatory Visit: Payer: BC Managed Care – PPO | Attending: Internal Medicine | Admitting: Internal Medicine

## 2023-01-23 ENCOUNTER — Encounter: Payer: Self-pay | Admitting: Internal Medicine

## 2023-01-23 VITALS — BP 102/80 | HR 57 | Ht 75.0 in | Wt 181.4 lb

## 2023-01-23 DIAGNOSIS — I2510 Atherosclerotic heart disease of native coronary artery without angina pectoris: Secondary | ICD-10-CM

## 2023-01-23 DIAGNOSIS — K224 Dyskinesia of esophagus: Secondary | ICD-10-CM | POA: Insufficient documentation

## 2023-01-23 DIAGNOSIS — R634 Abnormal weight loss: Secondary | ICD-10-CM | POA: Diagnosis not present

## 2023-01-23 DIAGNOSIS — I5022 Chronic systolic (congestive) heart failure: Secondary | ICD-10-CM | POA: Diagnosis not present

## 2023-01-23 DIAGNOSIS — M549 Dorsalgia, unspecified: Secondary | ICD-10-CM | POA: Insufficient documentation

## 2023-01-23 DIAGNOSIS — I471 Supraventricular tachycardia, unspecified: Secondary | ICD-10-CM | POA: Diagnosis not present

## 2023-01-23 NOTE — Patient Instructions (Signed)
Medication Instructions:  Your physician recommends that you continue on your current medications as directed. Please refer to the Current Medication list given to you today.   *If you need a refill on your cardiac medications before your next appointment, please call your pharmacy*   Lab Work: No labs ordered today    Testing/Procedures: No test ordered today    Follow-Up: At [facility], you and your health needs are our priority.  As part of our continuing mission to provide you with exceptional heart care, we have created designated Provider Care Teams.  These Care Teams include your primary Cardiologist (physician) and Advanced Practice Providers (APPs -  Physician Assistants and Nurse Practitioners) who all work together to provide you with the care you need, when you need it.  We recommend signing up for the patient portal called "MyChart".  Sign up information is provided on this After Visit Summary.  MyChart is used to connect with patients for Virtual Visits (Telemedicine).  Patients are able to view lab/test results, encounter notes, upcoming appointments, etc.  Non-urgent messages can be sent to your provider as well.   To learn more about what you can do with MyChart, go to [email or web address].    Your next appointment:   6 month(s)  Provider:   You may see [name], MD or one of the following Advanced Practice Providers on your designated Care Team:   Nicolasa Ducking, NP Eula Listen, PA-C Cadence Fransico Michael, PA-C Charlsie Quest, NP

## 2023-01-25 ENCOUNTER — Ambulatory Visit: Payer: BC Managed Care – PPO | Admitting: Physician Assistant

## 2023-01-25 ENCOUNTER — Other Ambulatory Visit: Payer: Self-pay | Admitting: Physician Assistant

## 2023-01-25 DIAGNOSIS — R2241 Localized swelling, mass and lump, right lower limb: Secondary | ICD-10-CM

## 2023-01-31 ENCOUNTER — Ambulatory Visit
Admission: RE | Admit: 2023-01-31 | Discharge: 2023-01-31 | Disposition: A | Discharge status: Home / Self Care | Payer: BC Managed Care – PPO | Source: Ambulatory Visit | Attending: Physician Assistant | Admitting: Physician Assistant

## 2023-01-31 DIAGNOSIS — R2241 Localized swelling, mass and lump, right lower limb: Secondary | ICD-10-CM | POA: Diagnosis not present

## 2023-01-31 MED ORDER — GADOBUTROL 1 MMOL/ML IV SOLN
7.5000 mL | Freq: Once | INTRAVENOUS | Status: AC | PRN
  Administered 2023-01-31: 7.5 mL via INTRAVENOUS

## 2023-02-02 ENCOUNTER — Encounter: Payer: Self-pay | Admitting: Nurse Practitioner

## 2023-02-02 DIAGNOSIS — R109 Unspecified abdominal pain: Secondary | ICD-10-CM

## 2023-02-06 ENCOUNTER — Other Ambulatory Visit: Payer: Self-pay

## 2023-02-06 NOTE — Progress Notes (Signed)
Error

## 2023-02-07 ENCOUNTER — Ambulatory Visit
Admission: RE | Admit: 2023-02-07 | Discharge: 2023-02-07 | Disposition: A | Discharge status: Home / Self Care | Payer: BC Managed Care – PPO | Attending: Physician Assistant | Admitting: Physician Assistant

## 2023-02-07 ENCOUNTER — Encounter: Payer: Self-pay | Admitting: Physician Assistant

## 2023-02-07 ENCOUNTER — Ambulatory Visit
Admission: RE | Admit: 2023-02-07 | Discharge: 2023-02-07 | Disposition: A | Discharge status: Home / Self Care | Payer: BC Managed Care – PPO | Source: Ambulatory Visit | Attending: Physician Assistant | Admitting: Physician Assistant

## 2023-02-07 ENCOUNTER — Ambulatory Visit: Payer: BC Managed Care – PPO | Admitting: Physician Assistant

## 2023-02-07 VITALS — BP 103/67 | HR 59 | Ht 75.0 in | Wt 180.0 lb

## 2023-02-07 DIAGNOSIS — R109 Unspecified abdominal pain: Secondary | ICD-10-CM

## 2023-02-07 DIAGNOSIS — N401 Enlarged prostate with lower urinary tract symptoms: Secondary | ICD-10-CM

## 2023-02-07 DIAGNOSIS — Z87442 Personal history of urinary calculi: Secondary | ICD-10-CM | POA: Diagnosis not present

## 2023-02-07 DIAGNOSIS — R3912 Poor urinary stream: Secondary | ICD-10-CM

## 2023-02-07 LAB — URINALYSIS, COMPLETE
Bilirubin, UA: NEGATIVE
Ketones, UA: NEGATIVE
Leukocytes,UA: NEGATIVE
Nitrite, UA: NEGATIVE
Protein,UA: NEGATIVE
RBC, UA: NEGATIVE
Specific Gravity, UA: 1.02 (ref 1.005–1.030)
Urobilinogen, Ur: 0.2 mg/dL (ref 0.2–1.0)
pH, UA: 6 (ref 5.0–7.5)

## 2023-02-07 LAB — MICROSCOPIC EXAMINATION

## 2023-02-07 LAB — BLADDER SCAN AMB NON-IMAGING: Scan Result: 0

## 2023-02-07 NOTE — Progress Notes (Signed)
02/07/2023 5:06 PM   [name] 1960-06-04 [id number]  CC: Chief Complaint  Patient presents with   Nephrolithiasis   HPI: [name] is a 62 y.o. male with PMH BPH s/p TURP with Dr. Apolinar Junes in October 2023 and heart failure on Jardiance who presents today for evaluation of a possible acute stone episode.   Today he reports about a month of pain in his bilateral flanks, R>L, as well as weak stream and straining to void.  He denies fever, chills, nausea, vomiting, or gross hematuria.  KUB today with no radiopaque ureteral stones.  In-office UA today positive for 3+ glucose; urine microscopy pan negative. PVR 0mL.  PMH: Past Medical History:  Diagnosis Date   Anginal pain (HCC) 10/16/2013   Aortic atherosclerosis (HCC) 11/16/2020   Ascending aorta dilatation (HCC) 09/29/2020   a.) TTE 09/29/2020: Ao root 37 mm, asc Ao 39 mm, Ao arch 33 mm; b.) TTE 08/06/2021: Ao root 37 mm   CAD (coronary artery disease) 10/17/2013   a.) LHC 10/17/2013: EF 65%. 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) 09/29/2020   a.) TTE 09/29/2020: EF 45-50%, sev bas-mid inferior HK, GLS -15.5%, mild MR, G1DD; b.) TTE 08/06/2021: EF 45-50%, no RWMAs, GLS -16.5%, mild MR, G2DD   History of 2019 novel coronavirus disease (COVID-19) 01/2020   Hypertension 1976   only on meds for a year   Implantable loop recorder 12/09/2013   a.) Medtronic ILR device   NICM (nonischemic cardiomyopathy) (HCC)    a.) TTE 10/08/2014: EF 50-55%; b.) TTE 09/29/2020: EF 45-50%; c.) TTE 08/06/2021: EF 45-50%   Nonsustained ventricular tachycardia (HCC) 05/2014   a.) noted on ILR in 05/2014; b.) holter 10/2021: NSVT x 2  episodes; longest lasted 6 beats at a rate of 197 bpm   Pneumonia due to COVID-19 virus 01/2020   a.) hospitalized and Tx'd with MAB infusions   Prostatism 01/2022   surgery - benign   PSVT (paroxysmal supraventricular tachycardia) (HCC) 10/2021   a.) holter 10/2021: 148 SVT episodes with max HR 226 bpm   Sinus tachycardia    Syncope 10/16/2013    Surgical History: Past Surgical History:  Procedure Laterality Date   COLONOSCOPY WITH PROPOFOL N/A 11/24/2020   Procedure: COLONOSCOPY WITH PROPOFOL;  Surgeon: [name], MD;  Location: [facility] ENDOSCOPY;  Service: Endoscopy;  Laterality: N/A;   COLONOSCOPY WITH PROPOFOL N/A 01/18/2023   Procedure: COLONOSCOPY WITH PROPOFOL;  Surgeon: [name], MD;  Location: [facility] ENDOSCOPY;  Service: Endoscopy;  Laterality: N/A;   ESOPHAGEAL DILATION  11/29/2022   Procedure: ESOPHAGEAL DILATION;  Surgeon: [name], MD;  Location: [facility] ENDOSCOPY;  Service: Endoscopy;;   ESOPHAGOGASTRODUODENOSCOPY (EGD) WITH PROPOFOL N/A 11/29/2022   Procedure: ESOPHAGOGASTRODUODENOSCOPY (EGD) WITH PROPOFOL;  Surgeon: [name], MD;  Location: [facility] ENDOSCOPY;  Service: Endoscopy;  Laterality: N/A;   LEFT HEART CATHETERIZATION WITH CORONARY ANGIOGRAM N/A 10/17/2013   Procedure: LEFT HEART CATHETERIZATION WITH CORONARY ANGIOGRAM;  Surgeon: Kathleene Hazel, MD;  Location: [facility] CATH LAB;  Service: Cardiovascular;  Laterality: N/A;   LOOP RECORDER IMPLANT N/A 12/09/2013   Procedure: LOOP RECORDER IMPLANT;  Surgeon: Duke Salvia, MD;  Location[facility] CATH LAB;  Service: Cardiovascular;  Laterality: N/A;   TRANSURETHRAL RESECTION OF PROSTATE N/A 01/10/2022   Procedure: TRANSURETHRAL RESECTION OF THE PROSTATE (TURP);  Surgeon: Vanna Scotland, MD;  Location: [facility] ORS;  Service: Urology;  Laterality: N/A;    Home Medications:  Allergies as of 02/07/2023       Reactions   Amoxicillin Rash        Medication List        Accurate as of February 07, 2023  5:06 PM. If  you have any questions, ask your nurse or doctor.          STOP taking these medications    oxybutynin 10 MG 24 hr tablet Commonly known as: DITROPAN-XL Stopped by: Carman Ching       TAKE these medications    alendronate 70 MG tablet Commonly known as: FOSAMAX TAKE ONE TABLET BY MOUTH EACH WEEK, ON AN EMPTY STOMACH BEFORE BREAKFAST WITH 8oz OF WATER AND REMAIN UPRIGHT FOR :30   aspirin 81 MG chewable tablet Chew 81 mg by mouth daily.   atorvastatin 40 MG tablet Commonly known as: LIPITOR Take 1 tablet (40 mg total) by mouth daily. Please keep upcoming appointment for future refills. Thank you.   Entresto 24-26 MG Generic drug: sacubitril-valsartan Take 1 tablet by mouth 2 (two) times daily. Please keep scheduled appointment for future refills. Thank you.   famotidine 20 MG tablet Commonly known as: PEPCID TAKE 1 TABLET BY MOUTH 1 HOUR BEFORE BEDTIME   Jardiance 10 MG Tabs tablet Generic drug: empagliflozin TAKE 1 TABLET BY MOUTH ONCE EVERY MORNING WITH BREAKFAST   metoprolol tartrate 25 MG tablet Commonly known as: LOPRESSOR Take 1 tablet (25 mg total) by mouth 2 (two) times daily.   omeprazole 20 MG capsule Commonly known as: PRILOSEC TAKE 1 CAPSULE BY MOUTH TWICE DAILY BEFORE A MEAL   spironolactone 25 MG tablet Commonly known as: ALDACTONE Take 0.5 tablets (12.5 mg total) by mouth daily. KEEP OV.        Allergies:  Allergies  Allergen Reactions   Amoxicillin Rash    Family History: Family History  Problem Relation Age of Onset   Hypertension Mother    Arrhythmia Mother    Heart disease Mother    Parkinson's disease Mother    Alzheimer's disease Mother    Deep vein thrombosis Father    Colon cancer Father    Sudden death Maternal Grandfather 7   ADD / ADHD Paternal Aunt    Deep vein thrombosis Paternal Aunt    Deep vein thrombosis Paternal Uncle     Social History:   reports that he has never smoked. He has never been exposed  to tobacco smoke. He has never used smokeless tobacco. He reports that he does not drink alcohol and does not use drugs.  Physical Exam: BP 103/67   Pulse (!) 59   Ht 6\' 3"  (1.905 m)   Wt 180 lb (81.6 kg)   BMI 22.50 kg/m   Constitutional:  Alert and oriented, no acute distress, nontoxic appearing HEENT: [place], AT Cardiovascular: No clubbing, cyanosis, or edema Respiratory: Normal respiratory effort, no increased work of breathing Skin: No rashes, bruises or suspicious lesions Neurologic: Grossly intact, no focal deficits, moving all 4 extremities Psychiatric: Normal mood and affect  Laboratory Data: Results for orders placed or performed in visit on 02/07/23  Microscopic Examination   Urine  Result Value Ref Range   WBC, UA 0-5 0 - 5 /hpf   RBC, Urine 0-2 0 -  2 /hpf   Epithelial Cells (non renal) 0-10 0 - 10 /hpf   Bacteria, UA Few None seen/Few  Urinalysis, Complete  Result Value Ref Range   Specific Gravity, UA 1.020 1.005 - 1.030   pH, UA 6.0 5.0 - 7.5   Color, UA Yellow Yellow   Appearance Ur Clear Clear   Leukocytes,UA Negative Negative   Protein,UA Negative Negative/Trace   Glucose, UA 3+ (A) Negative   Ketones, UA Negative Negative   RBC, UA Negative Negative   Bilirubin, UA Negative Negative   Urobilinogen, Ur 0.2 0.2 - 1.0 mg/dL   Nitrite, UA Negative Negative   Microscopic Examination See below:   Bladder Scan (Post Void Residual) in office  Result Value Ref Range   Scan Result 0    Pertinent Imaging: KUB, 02/07/2023: CLINICAL DATA:  Right-sided flank pain   EXAM: ABDOMEN - 1 VIEW   COMPARISON:  None Available.   FINDINGS: The bowel gas pattern is normal. No radio-opaque calculi or other significant radiographic abnormality are seen.   IMPRESSION: No acute abnormality noted.     Electronically Signed   By: Alcide Clever M.D.   On: 03/02/2023 03:43  I personally reviewed the images referenced above and note no radiopaque ureteral  stones.  Assessment & Plan:   1. Benign prostatic hyperplasia with weak urinary stream He is emptying appropriately, suspect obstructive LUTS are being exacerbated by oxybutynin.  I suggested he stop this and reach out to me via MyChart in 2-4 weeks.  If urgency/frequency has worsened, can resume it. - Bladder Scan (Post Void Residual) in office  2. Flank pain with history of urolithiasis UA is bland and I do not see any ureteral stones on KUB, however we will pursue CT stone study for more definitive evaluation. - Urinalysis, Complete - CT RENAL STONE STUDY; Future  Return for Will call with results.  Carman Ching, PA-C  [facility] [address] [place], [place] [address] [phone]

## 2023-02-07 NOTE — Progress Notes (Unsigned)
There were no vitals taken for this visit.   Subjective:    Patient ID: [name], male    DOB: 1961/01/15, 62 y.o.   MRN: [id number]  HPI: [name] is a 62 y.o. male presenting on 02/08/2023 for comprehensive medical examination. Current medical complaints include:{Blank single:19197::"none","***"}  He currently lives with: Interim Problems from his last visit: {Blank single:19197::"yes","no"}  HYPERLIPIDEMIA Hyperlipidemia status: {Blank single:19197::"excellent compliance","good compliance","fair compliance","poor compliance"} Satisfied with current treatment?  {Blank single:19197::"yes","no"} Side effects:  {Blank single:19197::"yes","no"} Medication compliance: {Blank single:19197::"excellent compliance","good compliance","fair compliance","poor compliance"} Past cholesterol meds: {Blank multiple:19196::"none","atorvastain (lipitor)","lovastatin (mevacor)","pravastatin (pravachol)","rosuvastatin (crestor)","simvastatin (zocor)","vytorin","fenofibrate (tricor)","gemfibrozil","ezetimide (zetia)","niaspan","lovaza"} Supplements: {Blank multiple:19196::"none","fish oil","niacin","red yeast rice"} Aspirin:  {Blank single:19197::"yes","no"} The 10-year ASCVD risk score (Arnett DK, et al., 2019) is: 7%   Values used to calculate the score:     Age: 61 years     Sex: Male     Is Non-Hispanic African American: No     Diabetic: No     Tobacco smoker: No     Systolic Blood Pressure: 102 mmHg     Is BP treated: No     HDL Cholesterol: 44 mg/dL     Total Cholesterol: 148 mg/dL Chest pain:  {Blank YNWGNF:62130::"QMV","HQ"} Coronary artery disease:  {Blank single:19197::"yes","no"} Family history CAD:  {Blank single:19197::"yes","no"} Family history early CAD:  {Blank single:19197::"yes","no"}   Depression Screen done today and results listed below:     12/26/2022    9:10 AM 01/21/2022   11:16 AM 01/06/2022    8:27 AM 12/07/2021    8:17 AM 09/28/2021    4:10 PM  Depression  screen PHQ 2/9  Decreased Interest 0 0 0 1 1  Down, Depressed, Hopeless 1 0 0 1 1  PHQ - 2 Score 1 0 0 2 2  Altered sleeping 0 0 0 0 1  Tired, decreased energy 3 0 0 1 1  Change in appetite 3 0 0 0 0  Feeling bad or failure about yourself  0 0 0 1 1  Trouble concentrating 0 0 0 0 1  Moving slowly or fidgety/restless 2 0 0 0 0  Suicidal thoughts 0 0 0 0 0  PHQ-9 Score 9 0 0 4 6  Difficult doing work/chores  Not difficult at all Not difficult at all Somewhat difficult Somewhat difficult    The patient {has/does not IONG:29528} a history of falls. I {did/did not:19850} complete a risk assessment for falls. A plan of care for falls {was/was not:19852} documented.   Past Medical History:  Past Medical History:  Diagnosis Date   Anginal pain (HCC) 10/16/2013   Aortic atherosclerosis (HCC) 11/16/2020   Ascending aorta dilatation (HCC) 09/29/2020   a.) TTE 09/29/2020: Ao root 37 mm, asc Ao 39 mm, Ao arch 33 mm; b.) TTE 08/06/2021: Ao root 37 mm   CAD (coronary artery disease) 10/17/2013   a.) LHC 10/17/2013: EF 65%. 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) 09/29/2020   a.) TTE 09/29/2020: EF 45-50%, sev bas-mid inferior HK, GLS -15.5%, mild MR, G1DD; b.) TTE 08/06/2021: EF 45-50%, no RWMAs, GLS -16.5%, mild MR, G2DD   History of 2019 novel coronavirus disease (COVID-19) 01/2020   Hypertension  1976   only on meds for a year   Implantable loop recorder 12/09/2013   a.) Medtronic ILR device   NICM (nonischemic cardiomyopathy) (HCC)    a.) TTE 10/08/2014: EF 50-55%; b.) TTE 09/29/2020: EF 45-50%; c.) TTE 08/06/2021: EF 45-50%   Nonsustained ventricular tachycardia (HCC) 05/2014    a.) noted on ILR in 05/2014; b.) holter 10/2021: NSVT x 2 episodes; longest lasted 6 beats at a rate of 197 bpm   Pneumonia due to COVID-19 virus 01/2020   a.) hospitalized and Tx'd with MAB infusions   Prostatism 01/2022   surgery - benign   PSVT (paroxysmal supraventricular tachycardia) (HCC) 10/2021   a.) holter 10/2021: 148 SVT episodes with max HR 226 bpm   Sinus tachycardia    Syncope 10/16/2013    Surgical History:  Past Surgical History:  Procedure Laterality Date   COLONOSCOPY WITH PROPOFOL N/A 11/24/2020   Procedure: COLONOSCOPY WITH PROPOFOL;  Surgeon: [name], MD;  Location[facility] ENDOSCOPY;  Service: Endoscopy;  Laterality: N/A;   COLONOSCOPY WITH PROPOFOL N/A 01/18/2023   Procedure: COLONOSCOPY WITH PROPOFOL;  Surgeon: [name], MD;  Location: [facility] ENDOSCOPY;  Service: Endoscopy;  Laterality: N/A;   ESOPHAGEAL DILATION  11/29/2022   Procedure: ESOPHAGEAL DILATION;  Surgeon: [name], MD;  Location: [facility] ENDOSCOPY;  Service: Endoscopy;;   ESOPHAGOGASTRODUODENOSCOPY (EGD) WITH PROPOFOL N/A 11/29/2022   Procedure: ESOPHAGOGASTRODUODENOSCOPY (EGD) WITH PROPOFOL;  Surgeon: [name], MD;  Location: [facility] ENDOSCOPY;  Service: Endoscopy;  Laterality: N/A;   LEFT HEART CATHETERIZATION WITH CORONARY ANGIOGRAM N/A 10/17/2013   Procedure: LEFT HEART CATHETERIZATION WITH CORONARY ANGIOGRAM;  Surgeon: Kathleene Hazel, MD;  Location: [facility] CATH LAB;  Service: Cardiovascular;  Laterality: N/A;   LOOP RECORDER IMPLANT N/A 12/09/2013   Procedure: LOOP RECORDER IMPLANT;  Surgeon: Duke Salvia, MD;  Location: [facility] CATH LAB;  Service: Cardiovascular;  Laterality: N/A;   TRANSURETHRAL RESECTION OF PROSTATE N/A 01/10/2022   Procedure: TRANSURETHRAL RESECTION OF THE PROSTATE (TURP);  Surgeon: Vanna Scotland, MD;  Location: [facility] ORS;  Service: Urology;  Laterality: N/A;    Medications:  Current Outpatient Medications on File Prior to Visit  Medication Sig   alendronate  (FOSAMAX) 70 MG tablet TAKE ONE TABLET BY MOUTH EACH WEEK, ON AN EMPTY STOMACH BEFORE BREAKFAST WITH 8oz OF WATER AND REMAIN UPRIGHT FOR :30   aspirin 81 MG chewable tablet Chew 81 mg by mouth daily.   atorvastatin (LIPITOR) 40 MG tablet Take 1 tablet (40 mg total) by mouth daily. Please keep upcoming appointment for future refills. Thank you.   famotidine (PEPCID) 20 MG tablet TAKE 1 TABLET BY MOUTH 1 HOUR BEFORE BEDTIME   JARDIANCE 10 MG TABS tablet TAKE 1 TABLET BY MOUTH ONCE EVERY MORNING WITH BREAKFAST   metoprolol tartrate (LOPRESSOR) 25 MG tablet Take 1 tablet (25 mg total) by mouth 2 (two) times daily.   omeprazole (PRILOSEC) 20 MG capsule TAKE 1 CAPSULE BY MOUTH TWICE DAILY BEFORE A MEAL   oxybutynin (DITROPAN-XL) 10 MG 24 hr tablet TAKE 1 TABLET BY MOUTH ONCE DAILY   sacubitril-valsartan (ENTRESTO) 24-26 MG Take 1 tablet by mouth 2 (two) times daily. Please keep scheduled appointment for future refills. Thank you.   spironolactone (ALDACTONE) 25 MG tablet Take 0.5 tablets (12.5 mg total) by mouth daily. KEEP OV.   Current Facility-Administered Medications on File Prior to Visit  Medication   Study - VICTORION-1 PREVENT - inclisiran 300 mg/1.27mL or placebo SQ injection (PI-Stuckey)    Allergies:  Allergies  Allergen Reactions   Amoxicillin Rash    Social History:  Social History   Socioeconomic History   Marital status: Significant Other    Spouse name: JUDY   Number of children: Not on file   Years of education: Not on file   Highest education level: Bachelor's degree (e.g., BA, AB, BS)  Occupational History   Not on file  Tobacco Use   Smoking status: Never    Passive exposure: Never   Smokeless tobacco: Never  Vaping Use   Vaping status: Never Used  Substance and Sexual Activity   Alcohol use: No   Drug use: No   Sexual activity: Yes  Other Topics Concern   Not on file  Social History Narrative   Not on file   Social Determinants of Health   Financial  Resource Strain: Medium Risk (02/05/2023)   Overall Financial Resource Strain (CARDIA)    Difficulty of Paying Living Expenses: Somewhat hard  Food Insecurity: Food Insecurity Present (02/05/2023)   Hunger Vital Sign    Worried About Running Out of Food in the Last Year: Often true    Ran Out of Food in the Last Year: Sometimes true  Transportation Needs: No Transportation Needs (02/05/2023)   PRAPARE - Administrator, Civil Service (Medical): No    Lack of Transportation (Non-Medical): No  Physical Activity: Sufficiently Active (02/05/2023)   Exercise Vital Sign    Days of Exercise per Week: 7 days    Minutes of Exercise per Session: 70 min  Stress: Stress Concern Present (02/05/2023)   [employer] of Occupational Health - Occupational Stress Questionnaire    Feeling of Stress : To some extent  Social Connections: Moderately Integrated (02/05/2023)   Social Connection and Isolation Panel [NHANES]    Frequency of Communication with Friends and Family: More than three times a week    Frequency of Social Gatherings with Friends and Family: More than three times a week    Attends Religious Services: More than 4 times per year    Active Member of [employer] or Organizations: No    Attends Engineer, structural: Not on file    Marital Status: Living with partner  Intimate Partner Violence: Not on file   Social History   Tobacco Use  Smoking Status Never   Passive exposure: Never  Smokeless Tobacco Never   Social History   Substance and Sexual Activity  Alcohol Use No    Family History:  Family History  Problem Relation Age of Onset   Hypertension Mother    Arrhythmia Mother    Heart disease Mother    Parkinson's disease Mother    Alzheimer's disease Mother    Deep vein thrombosis Father    Colon cancer Father    Sudden death Maternal Grandfather 57   ADD / ADHD Paternal Aunt    Deep vein thrombosis Paternal Aunt    Deep vein thrombosis Paternal  Uncle     Past medical history, surgical history, medications, allergies, family history and social history reviewed with patient today and changes made to appropriate areas of the chart.   ROS All other ROS negative except what is listed above and in the HPI.      Objective:    There were no vitals taken for this visit.  Wt Readings from Last 3 Encounters:  01/23/23 181 lb 6.4 oz (82.3 kg)  01/18/23 175 lb 6.4 oz (79.6 kg)  12/26/22 188 lb 9.6 oz (85.5 kg)  Physical Exam  Results for orders placed or performed in visit on 12/26/22  Comp Met (CMET)  Result Value Ref Range   Glucose 88 70 - 99 mg/dL   BUN 14 8 - 27 mg/dL   Creatinine, Ser 2.44 0.76 - 1.27 mg/dL   eGFR 98 >01 UU/VOZ/3.66   BUN/Creatinine Ratio 17 10 - 24   Sodium 140 134 - 144 mmol/L   Potassium 4.2 3.5 - 5.2 mmol/L   Chloride 104 96 - 106 mmol/L   CO2 24 20 - 29 mmol/L   Calcium 9.3 8.6 - 10.2 mg/dL   Total Protein 6.1 6.0 - 8.5 g/dL   Albumin 4.1 3.9 - 4.9 g/dL   Globulin, Total 2.0 1.5 - 4.5 g/dL   Bilirubin Total 0.6 0.0 - 1.2 mg/dL   Alkaline Phosphatase 58 44 - 121 IU/L   AST 14 0 - 40 IU/L   ALT 7 0 - 44 IU/L  CBC w/Diff  Result Value Ref Range   WBC 5.9 3.4 - 10.8 x10E3/uL   RBC 4.90 4.14 - 5.80 x10E6/uL   Hemoglobin 15.0 13.0 - 17.7 g/dL   Hematocrit 44.0 34.7 - 51.0 %   MCV 94 79 - 97 fL   MCH 30.6 26.6 - 33.0 pg   MCHC 32.8 31.5 - 35.7 g/dL   RDW 42.5 95.6 - 38.7 %   Platelets 263 150 - 450 x10E3/uL   Neutrophils 64 Not Estab. %   Lymphs 22 Not Estab. %   Monocytes 12 Not Estab. %   Eos 1 Not Estab. %   Basos 1 Not Estab. %   Neutrophils Absolute 3.8 1.4 - 7.0 x10E3/uL   Lymphocytes Absolute 1.3 0.7 - 3.1 x10E3/uL   Monocytes Absolute 0.7 0.1 - 0.9 x10E3/uL   EOS (ABSOLUTE) 0.1 0.0 - 0.4 x10E3/uL   Basophils Absolute 0.0 0.0 - 0.2 x10E3/uL   Immature Granulocytes 0 Not Estab. %   Immature Grans (Abs) 0.0 0.0 - 0.1 x10E3/uL  Lipase  Result Value Ref Range   Lipase 19 13 - 78  U/L  Amylase  Result Value Ref Range   Amylase 41 31 - 110 U/L  TSH  Result Value Ref Range   TSH 0.801 0.450 - 4.500 uIU/mL  T4, free  Result Value Ref Range   Free T4 1.11 0.82 - 1.77 ng/dL      Assessment & Plan:   Problem List Items Addressed This Visit       Cardiovascular and Mediastinum   Chronic heart failure with mildly reduced ejection fraction (HFmrEF, 41-49%) (HCC)     Other   Mixed hyperlipidemia - Primary     Discussed aspirin prophylaxis for myocardial infarction prevention and decision was {Blank single:19197::"it was not indicated","made to continue ASA","made to start ASA","made to stop ASA","that we recommended ASA, and patient refused"}  LABORATORY TESTING:  Health maintenance labs ordered today as discussed above.   The natural history of prostate cancer and ongoing controversy regarding screening and potential treatment outcomes of prostate cancer has been discussed with the patient. The meaning of a false positive PSA and a false negative PSA has been discussed. He indicates understanding of the limitations of this screening test and wishes *** to proceed with screening PSA testing.   IMMUNIZATIONS:   - Tdap: Tetanus vaccination status reviewed: {tetanus status:315746}. - Influenza: {Blank single:19197::"Up to date","Administered today","Postponed to flu season","Refused","Given elsewhere"} - Pneumovax: {Blank single:19197::"Up to date","Administered today","Not applicable","Refused","Given elsewhere"} - Prevnar: {Blank single:19197::"Up to date","Administered today","Not applicable","Refused","Given elsewhere"} -  COVID: {Blank single:19197::"Up to date","Administered today","Not applicable","Refused","Given elsewhere"} - HPV: {Blank single:19197::"Up to date","Administered today","Not applicable","Refused","Given elsewhere"} - Shingrix vaccine: {Blank single:19197::"Up to date","Administered today","Not applicable","Refused","Given  elsewhere"}  SCREENING: - Colonoscopy: {Blank single:19197::"Up to date","Ordered today","Not applicable","Refused","Done elsewhere"}  Discussed with patient purpose of the colonoscopy is to detect colon cancer at curable precancerous or early stages   - AAA Screening: {Blank single:19197::"Up to date","Ordered today","Not applicable","Refused","Done elsewhere"}  -Hearing Test: {Blank single:19197::"Up to date","Ordered today","Not applicable","Refused","Done elsewhere"}  -Spirometry: {Blank single:19197::"Up to date","Ordered today","Not applicable","Refused","Done elsewhere"}   PATIENT COUNSELING:    Sexuality: Discussed sexually transmitted diseases, partner selection, use of condoms, avoidance of unintended pregnancy  and contraceptive alternatives.   Advised to avoid cigarette smoking.  I discussed with the patient that most people either abstain from alcohol or drink within safe limits (<=14/week and <=4 drinks/occasion for males, <=7/weeks and <= 3 drinks/occasion for females) and that the risk for alcohol disorders and other health effects rises proportionally with the number of drinks per week and how often a drinker exceeds daily limits.  Discussed cessation/primary prevention of drug use and availability of treatment for abuse.   Diet: Encouraged to adjust caloric intake to maintain  or achieve ideal body weight, to reduce intake of dietary saturated fat and total fat, to limit sodium intake by avoiding high sodium foods and not adding table salt, and to maintain adequate dietary potassium and calcium preferably from fresh fruits, vegetables, and low-fat dairy products.    stressed the importance of regular exercise  Injury prevention: Discussed safety belts, safety helmets, smoke detector, smoking near bedding or upholstery.   Dental health: Discussed importance of regular tooth brushing, flossing, and dental visits.   Follow up plan: NEXT PREVENTATIVE PHYSICAL DUE IN 1  YEAR. No follow-ups on file.

## 2023-02-08 ENCOUNTER — Encounter: Payer: Self-pay | Admitting: Nurse Practitioner

## 2023-02-08 ENCOUNTER — Ambulatory Visit (INDEPENDENT_AMBULATORY_CARE_PROVIDER_SITE_OTHER): Payer: BC Managed Care – PPO | Admitting: Nurse Practitioner

## 2023-02-08 VITALS — BP 92/58 | HR 61 | Temp 97.6°F | Ht 74.5 in | Wt 180.4 lb

## 2023-02-08 DIAGNOSIS — Z114 Encounter for screening for human immunodeficiency virus [HIV]: Secondary | ICD-10-CM | POA: Diagnosis not present

## 2023-02-08 DIAGNOSIS — R81 Glycosuria: Secondary | ICD-10-CM

## 2023-02-08 DIAGNOSIS — E782 Mixed hyperlipidemia: Secondary | ICD-10-CM

## 2023-02-08 DIAGNOSIS — Z0000 Encounter for general adult medical examination without abnormal findings: Secondary | ICD-10-CM | POA: Diagnosis not present

## 2023-02-08 DIAGNOSIS — I5022 Chronic systolic (congestive) heart failure: Secondary | ICD-10-CM

## 2023-02-08 DIAGNOSIS — I471 Supraventricular tachycardia, unspecified: Secondary | ICD-10-CM

## 2023-02-08 DIAGNOSIS — R634 Abnormal weight loss: Secondary | ICD-10-CM | POA: Diagnosis not present

## 2023-02-08 DIAGNOSIS — Z1159 Encounter for screening for other viral diseases: Secondary | ICD-10-CM | POA: Diagnosis not present

## 2023-02-08 LAB — URINALYSIS, ROUTINE W REFLEX MICROSCOPIC
Bilirubin, UA: NEGATIVE
Leukocytes,UA: NEGATIVE
Nitrite, UA: NEGATIVE
Protein,UA: NEGATIVE
RBC, UA: NEGATIVE
Specific Gravity, UA: 1.02 (ref 1.005–1.030)
Urobilinogen, Ur: 0.2 mg/dL (ref 0.2–1.0)
pH, UA: 6 (ref 5.0–7.5)

## 2023-02-08 NOTE — Assessment & Plan Note (Signed)
Chronic.  Followed by Dr. Okey Dupre and Dr. Graciela Husbands.  Currently on Metoprolol.  Doing well with current medication regimen.  Continue to follow up with Specialists.  Follow up in 6 months.  Call sooner if concerns arise.

## 2023-02-08 NOTE — Assessment & Plan Note (Signed)
Chronic.  Followed by Dr. Okey Dupre and Dr. Graciela Husbands.  Currently on [place].  Doing well with current medication regimen.  Continue to follow up with Specialists.  Follow up in 6 months.  Call sooner if concerns arise.

## 2023-02-08 NOTE — Addendum Note (Signed)
Addended by: Larae Grooms on: 02/08/2023 12:36 PM   Modules accepted: Orders

## 2023-02-08 NOTE — Assessment & Plan Note (Signed)
Weight today is 180lb.  Consistent with last visit.  Has gained 4lbs in the last couple of months.

## 2023-02-08 NOTE — Assessment & Plan Note (Signed)
Chronic.  Controlled.  Continue with current medication regimen of Atorvastatin daily.  Labs ordered today.  Return to clinic in 6 months for reevaluation.  Call sooner if concerns arise.   

## 2023-02-11 LAB — CBC WITH DIFFERENTIAL/PLATELET
Basophils Absolute: 0 10*3/uL (ref 0.0–0.2)
Basos: 0 %
EOS (ABSOLUTE): 0 10*3/uL (ref 0.0–0.4)
Eos: 1 %
Hematocrit: 48.2 % (ref 37.5–51.0)
Hemoglobin: 15.6 g/dL (ref 13.0–17.7)
Immature Grans (Abs): 0 10*3/uL (ref 0.0–0.1)
Immature Granulocytes: 0 %
Lymphocytes Absolute: 1.2 10*3/uL (ref 0.7–3.1)
Lymphs: 17 %
MCH: 31.5 pg (ref 26.6–33.0)
MCHC: 32.4 g/dL (ref 31.5–35.7)
MCV: 97 fL (ref 79–97)
Monocytes Absolute: 0.6 10*3/uL (ref 0.1–0.9)
Monocytes: 8 %
Neutrophils Absolute: 5.3 10*3/uL (ref 1.4–7.0)
Neutrophils: 74 %
Platelets: 286 10*3/uL (ref 150–450)
RBC: 4.96 x10E6/uL (ref 4.14–5.80)
RDW: 12.6 % (ref 11.6–15.4)
WBC: 7.1 10*3/uL (ref 3.4–10.8)

## 2023-02-11 LAB — COMPREHENSIVE METABOLIC PANEL
ALT: 10 IU/L (ref 0–44)
AST: 13 IU/L (ref 0–40)
Albumin: 4.2 g/dL (ref 3.9–4.9)
Alkaline Phosphatase: 67 IU/L (ref 44–121)
BUN/Creatinine Ratio: 24 (ref 10–24)
BUN: 21 mg/dL (ref 8–27)
Bilirubin Total: 0.7 mg/dL (ref 0.0–1.2)
CO2: 24 mmol/L (ref 20–29)
Calcium: 9.3 mg/dL (ref 8.6–10.2)
Chloride: 102 mmol/L (ref 96–106)
Creatinine, Ser: 0.87 mg/dL (ref 0.76–1.27)
Globulin, Total: 2.5 g/dL (ref 1.5–4.5)
Glucose: 95 mg/dL (ref 70–99)
Potassium: 4.6 mmol/L (ref 3.5–5.2)
Sodium: 139 mmol/L (ref 134–144)
Total Protein: 6.7 g/dL (ref 6.0–8.5)
eGFR: 96 mL/min/{1.73_m2} (ref >59)

## 2023-02-11 LAB — LIPID PANEL
Chol/HDL Ratio: 3 ratio (ref 0.0–5.0)
Cholesterol, Total: 157 mg/dL (ref 100–199)
HDL: 53 mg/dL (ref >39)
LDL Chol Calc (NIH): 94 mg/dL (ref 0–99)
Triglycerides: 49 mg/dL (ref 0–149)
VLDL Cholesterol Cal: 10 mg/dL (ref 5–40)

## 2023-02-11 LAB — HEMOGLOBIN A1C
Est. average glucose Bld gHb Est-mCnc: 114 mg/dL
Hgb A1c MFr Bld: 5.6 % (ref 4.8–5.6)

## 2023-02-11 LAB — HIV ANTIBODY (ROUTINE TESTING W REFLEX): HIV Screen 4th Generation wRfx: NONREACTIVE

## 2023-02-11 LAB — TSH: TSH: 0.641 u[IU]/mL (ref 0.450–4.500)

## 2023-02-11 LAB — HEPATITIS C ANTIBODY: Hep C Virus Ab: NONREACTIVE

## 2023-02-11 LAB — PSA: Prostate Specific Ag, Serum: 1 ng/mL (ref 0.0–4.0)

## 2023-02-20 ENCOUNTER — Encounter: Payer: Self-pay | Admitting: Physician Assistant

## 2023-02-20 DIAGNOSIS — R2241 Localized swelling, mass and lump, right lower limb: Secondary | ICD-10-CM

## 2023-02-21 ENCOUNTER — Ambulatory Visit
Admission: RE | Admit: 2023-02-21 | Discharge: 2023-02-21 | Disposition: A | Discharge status: Home / Self Care | Payer: BC Managed Care – PPO | Source: Ambulatory Visit | Attending: Physician Assistant | Admitting: Physician Assistant

## 2023-02-21 DIAGNOSIS — R109 Unspecified abdominal pain: Secondary | ICD-10-CM | POA: Insufficient documentation

## 2023-02-21 DIAGNOSIS — N3289 Other specified disorders of bladder: Secondary | ICD-10-CM | POA: Diagnosis not present

## 2023-02-21 DIAGNOSIS — Z87442 Personal history of urinary calculi: Secondary | ICD-10-CM | POA: Diagnosis not present

## 2023-02-21 NOTE — Progress Notes (Signed)
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 Further action is not typically indicated for these unless they undergo changes, grow in size or lead to issues with mobility or function. Please let us know if you have further questions or concerns.

## 2023-03-01 NOTE — Telephone Encounter (Signed)
Referral to General surgery placed per request. He will need to follow up with his PCP from here for ongoing management.

## 2023-03-06 ENCOUNTER — Ambulatory Visit: Payer: BC Managed Care – PPO | Admitting: Surgery

## 2023-03-08 NOTE — Telephone Encounter (Signed)
Pt scheduled, voiced understanding.

## 2023-03-13 ENCOUNTER — Other Ambulatory Visit: Payer: Self-pay

## 2023-03-13 ENCOUNTER — Other Ambulatory Visit: Payer: BC Managed Care – PPO

## 2023-03-13 DIAGNOSIS — N401 Enlarged prostate with lower urinary tract symptoms: Secondary | ICD-10-CM | POA: Diagnosis not present

## 2023-03-13 DIAGNOSIS — R109 Unspecified abdominal pain: Secondary | ICD-10-CM

## 2023-03-13 DIAGNOSIS — B349 Viral infection, unspecified: Secondary | ICD-10-CM | POA: Diagnosis not present

## 2023-03-13 LAB — URINALYSIS, COMPLETE
Bilirubin, UA: NEGATIVE
Ketones, UA: NEGATIVE
Leukocytes,UA: NEGATIVE
Nitrite, UA: NEGATIVE
Protein,UA: NEGATIVE
RBC, UA: NEGATIVE
Specific Gravity, UA: 1.02 (ref 1.005–1.030)
Urobilinogen, Ur: 4 mg/dL — ABNORMAL HIGH (ref 0.2–1.0)
pH, UA: 7 (ref 5.0–7.5)

## 2023-03-13 LAB — MICROSCOPIC EXAMINATION: Bacteria, UA: NONE SEEN

## 2023-03-15 ENCOUNTER — Ambulatory Visit (INDEPENDENT_AMBULATORY_CARE_PROVIDER_SITE_OTHER): Payer: BC Managed Care – PPO | Admitting: Surgery

## 2023-03-15 ENCOUNTER — Telehealth: Payer: Self-pay

## 2023-03-15 ENCOUNTER — Telehealth: Payer: Self-pay | Admitting: Emergency Medicine

## 2023-03-15 ENCOUNTER — Telehealth: Payer: Self-pay | Admitting: Internal Medicine

## 2023-03-15 ENCOUNTER — Encounter: Payer: Self-pay | Admitting: Surgery

## 2023-03-15 VITALS — BP 105/70 | HR 58 | Ht 75.0 in | Wt 180.2 lb

## 2023-03-15 DIAGNOSIS — D1721 Benign lipomatous neoplasm of skin and subcutaneous tissue of right arm: Secondary | ICD-10-CM | POA: Diagnosis not present

## 2023-03-15 DIAGNOSIS — D1723 Benign lipomatous neoplasm of skin and subcutaneous tissue of right leg: Secondary | ICD-10-CM

## 2023-03-15 DIAGNOSIS — R2241 Localized swelling, mass and lump, right lower limb: Secondary | ICD-10-CM

## 2023-03-15 NOTE — Patient Instructions (Signed)
Our surgery scheduler Britta Mccreedy will call you within 24-48 hours to get you scheduled. If you have not heard from her after 48 hours, please call our office. Have the blue sheet available when she calls to write down important information.   Lipoma Removal  Lipoma removal is a surgical procedure to remove a lipoma, which is a noncancerous (benign) tumor that is made up of fat cells. Most lipomas are small and painless and do not require treatment. They can form in many areas of the body but are most common under the skin of the back, arms, shoulders, buttocks, and thighs. You may need lipoma removal if you have a lipoma that is large, growing, or causing discomfort. Lipoma removal may also be done for cosmetic reasons. Tell a health care provider about: Any allergies you have. All medicines you are taking, including vitamins, herbs, eye drops, creams, and over-the-counter medicines. Any problems you or family members have had with anesthetic medicines. Any bleeding problems you have. Any surgeries you have had. Any medical conditions you have. Whether you are pregnant or may be pregnant. What are the risks? Generally, this is a safe procedure. However, problems may occur, including: Infection. Bleeding. Scarring. Allergic reactions to medicines. Damage to nearby structures or organs, such as damage to nerves or blood vessels near the lipoma. What happens before the procedure? When to Stop Eating and Drinking Follow instructions from your health care provider about what you may eat and drink before your procedure. These may include: 8 hours before your procedure Stop eating most foods. Do not eat meat, fried foods, or fatty foods. Eat only light foods, such as toast or crackers. All liquids are okay except energy drinks and alcohol. 6 hours before your procedure Stop eating. Drink only clear liquids, such as water, clear fruit juice, black coffee, plain tea, and sports drinks. Do not  drink energy drinks or alcohol. 2 hours before your procedure Stop drinking all liquids. You may be allowed to take medicines with small sips of water. If you do not follow your health care provider's instructions, your procedure may be delayed or canceled. Medicines Ask your health care provider about: Changing or stopping your regular medicines. This is especially important if you are taking diabetes medicines or blood thinners. Taking medicines such as aspirin and ibuprofen. These medicines can thin your blood. Do not take these medicines unless your health care provider tells you to take them. Taking over-the-counter medicines, vitamins, herbs, and supplements. General instructions You will have a physical exam. Your health care provider will check the size of the lipoma and whether it can be removed easily. You may have a biopsy and imaging tests, such as X-rays, a CT scan, and an MRI. Do not use any products that contain nicotine or tobacco for at least 4 weeks before the procedure. These products include cigarettes, chewing tobacco, and vaping devices, such as e-cigarettes. If you need help quitting, ask your health care provider. Ask your health care provider: How your surgery site will be marked. What steps will be taken to help prevent infection. These may include: Washing skin with a germ-killing soap. Taking antibiotic medicine. If you will be going home right after the procedure, plan to have a responsible adult: Take you home from the hospital or clinic. You will not be allowed to drive. Care for you for the time you are told. What happens during the procedure?  An IV will be inserted into one of your veins. You will be  given one or more of the following: A medicine to help you relax (sedative). A medicine to numb the area (local anesthetic). A medicine to make you fall asleep (general anesthetic). A medicine that is injected into an area of your body to numb everything  below the injection site (regional anesthetic). An incision will be made into the skin over the lipoma or very near the lipoma. The incision may be made in a natural skin line or crease. Tissues, nerves, and blood vessels near the lipoma will be moved out of the way. The lipoma and the capsule that surrounds it will be separated from the surrounding tissues. The lipoma will be removed. The incision may be closed with stitches (sutures). A bandage (dressing) will be placed over the incision. The procedure may vary among health care providers and hospitals. What happens after the procedure? Your blood pressure, heart rate, breathing rate, and blood oxygen level will be monitored until you leave the hospital or clinic. If you were prescribed an antibiotic medicine, use it as told by your health care provider. Do not stop using the antibiotic even if you start to feel better. If you were given a sedative during the procedure, it can affect you for several hours. Do not drive or operate machinery until your health care provider says that it is safe. Where to find more information OrthoInfo: orthoinfo.aaos.org Summary Before the procedure, follow instructions from your health care provider about eating and drinking, and changing or stopping your regular medicines. This is especially important if you are taking diabetes medicines or blood thinners. After the lipoma is removed, the incision may be closed with stitches (sutures) and covered with a bandage (dressing). If you were given a sedative during the procedure, it can affect you for several hours. Do not drive or operate machinery until your health care provider says that it is safe. This information is not intended to replace advice given to you by your health care provider. Make sure you discuss any questions you have with your health care provider. Document Revised: 04/16/2021 Document Reviewed: 04/16/2021 Elsevier Patient Education  2024  [employer].

## 2023-03-15 NOTE — H&P (View-Only) (Signed)
 03/15/2023  Reason for Visit:  Right thigh lipoma  Requesting Provider:  Jacquelin Hawking, PA-C  History of Present Illness: [name] is a 62 y.o. male presenting for evaluation of a right thigh lipoma.  The patient reports that he started noticing this mass about 4 months ago or so and reports that has been growing in size.  He also describes discomfort in the area when his grandchildren sit on his leg but otherwise denies any specific motor or sensory troubles from the mass itself.  He had an MRI of the right thigh on 01/31/2023 which showed an 11.7 x 5.4 x 3.5 cm intramuscular lipoma between the rectus and vastus medialis muscles.  The patient feels that since then, the mass has grown some as well.  He also has noted two additional lipomas in his right thigh and also a lipoma in his right forearm.  These are also non-tender, but they were not present too long ago.  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  Of note, he has also had weight loss over the past 8 months.  He has a history of dysphagia and recently had an EGD with dilation of a stricture with Dr. Servando Snare on 11/29/22.  Since then, he feels his weight has been more stable.  Before that, he was having difficulty eating due to the dysphagia.    Past Medical History: Past Medical History:  Diagnosis Date   Allergy don't remember   sinus   Anginal pain (HCC) 10/16/2013   Aortic atherosclerosis (HCC) 11/16/2020   Ascending aorta dilatation (HCC) 09/29/2020   a.) TTE 09/29/2020: Ao root 37 mm, asc Ao 39 mm, Ao arch 33 mm; b.) TTE 08/06/2021: Ao root 37 mm   CAD (coronary artery disease) 10/17/2013   a.) LHC 10/17/2013: EF 65%. 20% mLAD - med mgmt   Family history of blood clots    a.) hypercoagulable work-up 09/2021 was negative; PT gene mutation, Factor V leiden mutation, Protein C, S, and AT III all normal/negative   Fatigue due to sleep pattern disturbance 10/16/2013   GERD (gastroesophageal reflux disease) 2023   Heart  failure with mid-range ejection fraction (HFmEF) (HCC) 09/29/2020   a.) TTE 09/29/2020: EF 45-50%, sev bas-mid inferior HK, GLS -15.5%, mild MR, G1DD; b.) TTE 08/06/2021: EF 45-50%, no RWMAs, GLS -16.5%, mild MR, G2DD   History of 2019 novel coronavirus disease (COVID-19) 01/2020   Hypertension 1976   only on meds for a year   Implantable loop recorder 12/09/2013   a.) [employer] ILR device   NICM (nonischemic cardiomyopathy) (HCC)    a.) TTE 10/08/2014: EF 50-55%; b.) TTE 09/29/2020: EF 45-50%; c.) TTE 08/06/2021: EF 45-50%   Nonsustained ventricular tachycardia (HCC) 05/2014   a.) noted on ILR in 05/2014; b.) holter 10/2021: NSVT x 2 episodes; longest lasted 6 beats at a rate of 197 bpm   Osteoporosis last summer   fractured rib   Pneumonia due to COVID-19 virus 01/2020   a.) hospitalized and Tx'd with MAB infusions   Prostatism 01/2022   surgery - benign   PSVT (paroxysmal supraventricular tachycardia) (HCC) 10/2021   a.) holter 10/2021: 148 SVT episodes with max HR 226 bpm   Sinus tachycardia    Syncope 10/16/2013     Past Surgical History: Past Surgical History:  Procedure Laterality Date   COLONOSCOPY WITH PROPOFOL N/A 11/24/2020   Procedure: COLONOSCOPY WITH PROPOFOL;  Surgeon: [name], MD;  Location: [facility] ENDOSCOPY;  Service:  Endoscopy;  Laterality: N/A;   COLONOSCOPY WITH PROPOFOL N/A 01/18/2023   Procedure: COLONOSCOPY WITH PROPOFOL;  Surgeon: [name], MD;  Location: [facility] ENDOSCOPY;  Service: Endoscopy;  Laterality: N/A;   ESOPHAGEAL DILATION  11/29/2022   Procedure: ESOPHAGEAL DILATION;  Surgeon: [name], MD;  Location: [facility] ENDOSCOPY;  Service: Endoscopy;;   ESOPHAGOGASTRODUODENOSCOPY (EGD) WITH PROPOFOL N/A 11/29/2022   Procedure: ESOPHAGOGASTRODUODENOSCOPY (EGD) WITH PROPOFOL;  Surgeon: [name], MD;  Location: [facility] ENDOSCOPY;  Service: Endoscopy;  Laterality: N/A;   LEFT HEART CATHETERIZATION WITH CORONARY ANGIOGRAM N/A 10/17/2013   Procedure: LEFT  HEART CATHETERIZATION WITH CORONARY ANGIOGRAM;  Surgeon: Kathleene Hazel, MD;  Location: [facility] CATH LAB;  Service: Cardiovascular;  Laterality: N/A;   LOOP RECORDER IMPLANT N/A 12/09/2013   Procedure: LOOP RECORDER IMPLANT;  Surgeon: Duke Salvia, MD;  Location: [facility] CATH LAB;  Service: Cardiovascular;  Laterality: N/A;   TRANSURETHRAL RESECTION OF PROSTATE N/A 01/10/2022   Procedure: TRANSURETHRAL RESECTION OF THE PROSTATE (TURP);  Surgeon: Vanna Scotland, MD;  Location: [facility] ORS;  Service: Urology;  Laterality: N/A;    Home Medications: Prior to Admission medications   Medication Sig Start Date End Date Taking? Authorizing Provider  alendronate (FOSAMAX) 70 MG tablet TAKE ONE TABLET BY MOUTH EACH WEEK, ON AN EMPTY STOMACH BEFORE BREAKFAST WITH 8oz OF WATER AND REMAIN UPRIGHT FOR :30 01/03/23  Yes Mecum, Erin E, PA-C  aspirin 81 MG chewable tablet Chew 81 mg by mouth daily.   Yes [provider]  atorvastatin (LIPITOR) 40 MG tablet Take 1 tablet (40 mg total) by mouth daily. Please keep upcoming appointment for future refills. Thank you. 11/24/22  Yes Duke Salvia, MD  famotidine (PEPCID) 20 MG tablet TAKE 1 TABLET BY MOUTH 1 HOUR BEFORE BEDTIME 02/21/22  Yes Nyoka Cowden, MD  JARDIANCE 10 MG TABS tablet TAKE 1 TABLET BY MOUTH ONCE EVERY MORNING WITH BREAKFAST 01/03/23  Yes Duke Salvia, MD  metoprolol tartrate (LOPRESSOR) 25 MG tablet Take 1 tablet (25 mg total) by mouth 2 (two) times daily. 08/04/22  Yes Duke Salvia, MD  omeprazole (PRILOSEC) 20 MG capsule TAKE 1 CAPSULE BY MOUTH TWICE DAILY BEFORE A MEAL 05/12/22  Yes Larae Grooms, NP  sacubitril-valsartan (ENTRESTO) 24-26 MG Take 1 tablet by mouth 2 (two) times daily. Please keep scheduled appointment for future refills. Thank you. 11/24/22  Yes Duke Salvia, MD  spironolactone (ALDACTONE) 25 MG tablet Take 0.5 tablets (12.5 mg total) by mouth daily. KEEP OV. 11/24/22  Yes Duke Salvia, MD     Allergies: Allergies  Allergen Reactions   Amoxicillin Rash    Social History:  reports that he has never smoked. He has never been exposed to tobacco smoke. He has never used smokeless tobacco. He reports that he does not drink alcohol and does not use drugs.   Family History: Family History  Problem Relation Age of Onset   Hypertension Mother    Arrhythmia Mother    Heart disease Mother    Parkinson's disease Mother    Alzheimer's disease Mother    Deep vein thrombosis Father    Colon cancer Father    Cancer Father    Early death Father    Sudden death Maternal Grandfather 74   ADD / ADHD Paternal Aunt    Deep vein thrombosis Paternal Aunt    Deep vein thrombosis Paternal Uncle    Cancer Paternal Uncle    ADD / ADHD Brother     Review of  Systems: Review of Systems  Constitutional:  Negative for chills and fever.  Respiratory:  Negative for shortness of breath.   Cardiovascular:  Negative for chest pain.  Gastrointestinal:  Negative for nausea and vomiting.  Musculoskeletal:        Right thigh masses, right forearm mass    Physical Exam BP 105/70   Pulse (!) 58   Ht 6\' 3"  (1.905 m)   Wt 180 lb 3.2 oz (81.7 kg)   SpO2 98%   BMI 22.52 kg/m  CONSTITUTIONAL: No acute distress, well nourished. HEENT:  Normocephalic, atraumatic, extraocular motion intact. RESPIRATORY:  Lungs are clear, and breath sounds are equal bilaterally. Normal respiratory effort without pathologic use of accessory muscles. CARDIOVASCULAR: Heart is regular without murmurs, gallops, or rubs. MUSCULOSKELETAL:  The patient has an approximately 11.5 x 7 cm mass in the right proximal thigh which is soft, somewhat mobile, non-tender, without any overlying skin changes.  This is consistent with a lipoma as noted on his MRI.  He has also a smaller 1.5 cm lipoma proximal/lateral to this in the right thigh and an additional small 1.5 cm lipoma distal/medial to this larger mass in the right thigh.  Both  area soft, mobile, and non-tender.  In the right forearm, he has a 2.5 cm mass in the medial proximal aspect of the forearm, which is also soft, mobile, and non-tender. NEUROLOGIC:  Motor and sensation is grossly normal.  Cranial nerves are grossly intact. PSYCH:  Alert and oriented to person, place and time. Affect is normal.  Laboratory Analysis: Labs from 02/08/2023: Sodium 139, potassium 4.6, chloride 102, CO2 24, BUN 21, creatinine 0.87.  LFTs within normal limits.  WBC 7.1 hemoglobin 15.6, hematocrit 48.2, platelets 286.  Imaging: MRI right femur on 01/31/2023: IMPRESSION: 1. 11.7 x 5.4 x 3.5 cm fatty lesion located in the upper to mid medial anterior thigh. This appears to originate between the rectus femoris and the vastus medialis muscles. 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 This could be followed clinically and reimaged if any change in clinical exam (lesion enlarges or becomes fixed, firm or nodular). Depending on the clinical findings this could also be referred to orthopedic oncologist for potential excision. 2. No inguinal lymphadenopathy. 3. No significant intrapelvic findings.  Assessment and Plan: This is a 62 y.o. male with a large right thigh lipoma, 2 smaller right thigh lipomas, and a right forearm lipoma.  - Discussed with patient the findings on his MRI which show findings consistent with a lipoma.  On exam today, or masses palpable are soft, nontender, somewhat mobile consistent also with diagnosis of lipoma.  Discussed with patient that these are benign masses.  However the patient's main concern is related to the rapid growth.  Discussed with him that potentially this mass was actually smaller and present for longer time but not noticeable because it was intramuscular and now that has grown bigger and is more noticeable.  However due to concern for increasing size, would recommend proceeding with excision.  Discussed with the patient that we can  remove all 4 lipomas in the same setting which would be in the operating room under general anesthesia.  The patient is in agreement. - Discussed with him then the plan for excision of right thigh lipomas x 3 and right forearm lipoma x 1.  Reviewed with patient the surgery at length including the planned incisions, the risks of bleeding, infection, injury to surrounding structures, that this would be an outpatient procedure, postoperative  activity restrictions, pain control, and he is willing to proceed. - We will schedule the patient for surgery on 03/22/2023.  Will send for cardiology clearance as a precaution.  The patient reports that he only takes aspirin intermittently and his last dose was last week.  Discussed with him that he may hold aspirin for 5 days prior to surgery.  I spent 30 minutes dedicated to the care of this patient on the date of this encounter to include pre-visit review of records, face-to-face time with the patient discussing diagnosis and management, and any post-visit coordination of care.   Howie Ill, MD [facility]

## 2023-03-15 NOTE — Telephone Encounter (Signed)
error 

## 2023-03-15 NOTE — Telephone Encounter (Signed)
At the time of my visit with [name] in October, he did not have any unstable cardiac symptoms.  Unless he has developed new symptoms like chest pain, shortness of breath, edema, or syncope, I think it is reasonable for him to proceed with lipoma excision, which is low risk from a cardiovascular standpoint, without additional testing or intervention.  Aspirin can be held at the discretion of Dr. Aleen Campi for up to 7 days before the surgery and restarted when felt safe to do so.  [name], MD [facility]

## 2023-03-15 NOTE — Telephone Encounter (Signed)
   Pre-operative Risk Assessment    Patient Name: [name]  DOB: Feb 28, 1961 MRN: [id number]      Request for Surgical Clearance    Procedure:   Excision right thigh lipoma  Date of Surgery:  Clearance 03/22/23                                 Surgeon:  Dr. Henrene Dodge Surgeon's Group or Practice Name:  [facility] Phone number:  [phone] Fax number:  [phone]   Type of Clearance Requested:   - Medical    Type of Anesthesia:  General    Additional requests/questions:    SignedMaceo Pro Schools   03/15/2023, 4:08 PM

## 2023-03-15 NOTE — Progress Notes (Signed)
03/15/2023  Reason for Visit:  Right thigh lipoma  Requesting Provider:  Jacquelin Hawking, PA-C  History of Present Illness: [name] is a 62 y.o. male presenting for evaluation of a right thigh lipoma.  The patient reports that he started noticing this mass about 4 months ago or so and reports that has been growing in size.  He also describes discomfort in the area when his grandchildren sit on his leg but otherwise denies any specific motor or sensory troubles from the mass itself.  He had an MRI of the right thigh on 01/31/2023 which showed an 11.7 x 5.4 x 3.5 cm intramuscular lipoma between the rectus and vastus medialis muscles.  The patient feels that since then, the mass has grown some as well.  He also has noted two additional lipomas in his right thigh and also a lipoma in his right forearm.  These are also non-tender, but they were not present too long ago.  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  Of note, he has also had weight loss over the past 8 months.  He has a history of dysphagia and recently had an EGD with dilation of a stricture with Dr. Servando Snare on 11/29/22.  Since then, he feels his weight has been more stable.  Before that, he was having difficulty eating due to the dysphagia.    Past Medical History: Past Medical History:  Diagnosis Date   Allergy don't remember   sinus   Anginal pain (HCC) 10/16/2013   Aortic atherosclerosis (HCC) 11/16/2020   Ascending aorta dilatation (HCC) 09/29/2020   a.) TTE 09/29/2020: Ao root 37 mm, asc Ao 39 mm, Ao arch 33 mm; b.) TTE 08/06/2021: Ao root 37 mm   CAD (coronary artery disease) 10/17/2013   a.) LHC 10/17/2013: EF 65%. 20% mLAD - med mgmt   Family history of blood clots    a.) hypercoagulable work-up 09/2021 was negative; PT gene mutation, Factor V leiden mutation, Protein C, S, and AT III all normal/negative   Fatigue due to sleep pattern disturbance 10/16/2013   GERD (gastroesophageal reflux disease) 2023   Heart  failure with mid-range ejection fraction (HFmEF) (HCC) 09/29/2020   a.) TTE 09/29/2020: EF 45-50%, sev bas-mid inferior HK, GLS -15.5%, mild MR, G1DD; b.) TTE 08/06/2021: EF 45-50%, no RWMAs, GLS -16.5%, mild MR, G2DD   History of 2019 novel coronavirus disease (COVID-19) 01/2020   Hypertension 1976   only on meds for a year   Implantable loop recorder 12/09/2013   a.) [employer]onic ILR device   NICM (nonischemic cardiomyopathy) (HCC)    a.) TTE 10/08/2014: EF 50-55%; b.) TTE 09/29/2020: EF 45-50%; c.) TTE 08/06/2021: EF 45-50%   Nonsustained ventricular tachycardia (HCC) 05/2014   a.) noted on ILR in 05/2014; b.) holter 10/2021: NSVT x 2 episodes; longest lasted 6 beats at a rate of 197 bpm   Osteoporosis last summer   fractured rib   Pneumonia due to COVID-19 virus 01/2020   a.) hospitalized and Tx'd with MAB infusions   Prostatism 01/2022   surgery - benign   PSVT (paroxysmal supraventricular tachycardia) (HCC) 10/2021   a.) holter 10/2021: 148 SVT episodes with max HR 226 bpm   Sinus tachycardia    Syncope 10/16/2013     Past Surgical History: Past Surgical History:  Procedure Laterality Date   COLONOSCOPY WITH PROPOFOL N/A 11/24/2020   Procedure: COLONOSCOPY WITH PROPOFOL;  Surgeon: [name], MD;  Location: [facility] ENDOSCOPY;  Service:  Endoscopy;  Laterality: N/A;   COLONOSCOPY WITH PROPOFOL N/A 01/18/2023   Procedure: COLONOSCOPY WITH PROPOFOL;  Surgeon: [name], MD;  Location: [facility] ENDOSCOPY;  Service: Endoscopy;  Laterality: N/A;   ESOPHAGEAL DILATION  11/29/2022   Procedure: ESOPHAGEAL DILATION;  Surgeon: [name], MD;  Location: [facility] ENDOSCOPY;  Service: Endoscopy;;   ESOPHAGOGASTRODUODENOSCOPY (EGD) WITH PROPOFOL N/A 11/29/2022   Procedure: ESOPHAGOGASTRODUODENOSCOPY (EGD) WITH PROPOFOL;  Surgeon: [name], MD;  Location: [facility] ENDOSCOPY;  Service: Endoscopy;  Laterality: N/A;   LEFT HEART CATHETERIZATION WITH CORONARY ANGIOGRAM N/A 10/17/2013   Procedure: LEFT  HEART CATHETERIZATION WITH CORONARY ANGIOGRAM;  Surgeon: Kathleene Hazel, MD;  Location: [facility] CATH LAB;  Service: Cardiovascular;  Laterality: N/A;   LOOP RECORDER IMPLANT N/A 12/09/2013   Procedure: LOOP RECORDER IMPLANT;  Surgeon: Duke Salvia, MD;  Location: [facility] CATH LAB;  Service: Cardiovascular;  Laterality: N/A;   TRANSURETHRAL RESECTION OF PROSTATE N/A 01/10/2022   Procedure: TRANSURETHRAL RESECTION OF THE PROSTATE (TURP);  Surgeon: Vanna Scotland, MD;  Location: [facility] ORS;  Service: Urology;  Laterality: N/A;    Home Medications: Prior to Admission medications   Medication Sig Start Date End Date Taking? Authorizing Provider  alendronate (FOSAMAX) 70 MG tablet TAKE ONE TABLET BY MOUTH EACH WEEK, ON AN EMPTY STOMACH BEFORE BREAKFAST WITH 8oz OF WATER AND REMAIN UPRIGHT FOR :30 01/03/23  Yes Mecum, Erin E, PA-C  aspirin 81 MG chewable tablet Chew 81 mg by mouth daily.   Yes [provider]  atorvastatin (LIPITOR) 40 MG tablet Take 1 tablet (40 mg total) by mouth daily. Please keep upcoming appointment for future refills. Thank you. 11/24/22  Yes Duke Salvia, MD  famotidine (PEPCID) 20 MG tablet TAKE 1 TABLET BY MOUTH 1 HOUR BEFORE BEDTIME 02/21/22  Yes Nyoka Cowden, MD  JARDIANCE 10 MG TABS tablet TAKE 1 TABLET BY MOUTH ONCE EVERY MORNING WITH BREAKFAST 01/03/23  Yes Duke Salvia, MD  metoprolol tartrate (LOPRESSOR) 25 MG tablet Take 1 tablet (25 mg total) by mouth 2 (two) times daily. 08/04/22  Yes Duke Salvia, MD  omeprazole (PRILOSEC) 20 MG capsule TAKE 1 CAPSULE BY MOUTH TWICE DAILY BEFORE A MEAL 05/12/22  Yes Larae Grooms, NP  sacubitril-valsartan (ENTRESTO) 24-26 MG Take 1 tablet by mouth 2 (two) times daily. Please keep scheduled appointment for future refills. Thank you. 11/24/22  Yes Duke Salvia, MD  spironolactone (ALDACTONE) 25 MG tablet Take 0.5 tablets (12.5 mg total) by mouth daily. KEEP OV. 11/24/22  Yes Duke Salvia, MD     Allergies: Allergies  Allergen Reactions   Amoxicillin Rash    Social History:  reports that he has never smoked. He has never been exposed to tobacco smoke. He has never used smokeless tobacco. He reports that he does not drink alcohol and does not use drugs.   Family History: Family History  Problem Relation Age of Onset   Hypertension Mother    Arrhythmia Mother    Heart disease Mother    Parkinson's disease Mother    Alzheimer's disease Mother    Deep vein thrombosis Father    Colon cancer Father    Cancer Father    Early death Father    Sudden death Maternal Grandfather 74   ADD / ADHD Paternal Aunt    Deep vein thrombosis Paternal Aunt    Deep vein thrombosis Paternal Uncle    Cancer Paternal Uncle    ADD / ADHD Brother     Review of  Systems: Review of Systems  Constitutional:  Negative for chills and fever.  Respiratory:  Negative for shortness of breath.   Cardiovascular:  Negative for chest pain.  Gastrointestinal:  Negative for nausea and vomiting.  Musculoskeletal:        Right thigh masses, right forearm mass    Physical Exam BP 105/70   Pulse (!) 58   Ht 6\' 3"  (1.905 m)   Wt 180 lb 3.2 oz (81.7 kg)   SpO2 98%   BMI 22.52 kg/m  CONSTITUTIONAL: No acute distress, well nourished. HEENT:  Normocephalic, atraumatic, extraocular motion intact. RESPIRATORY:  Lungs are clear, and breath sounds are equal bilaterally. Normal respiratory effort without pathologic use of accessory muscles. CARDIOVASCULAR: Heart is regular without murmurs, gallops, or rubs. MUSCULOSKELETAL:  The patient has an approximately 11.5 x 7 cm mass in the right proximal thigh which is soft, somewhat mobile, non-tender, without any overlying skin changes.  This is consistent with a lipoma as noted on his MRI.  He has also a smaller 1.5 cm lipoma proximal/lateral to this in the right thigh and an additional small 1.5 cm lipoma distal/medial to this larger mass in the right thigh.  Both  area soft, mobile, and non-tender.  In the right forearm, he has a 2.5 cm mass in the medial proximal aspect of the forearm, which is also soft, mobile, and non-tender. NEUROLOGIC:  Motor and sensation is grossly normal.  Cranial nerves are grossly intact. PSYCH:  Alert and oriented to person, place and time. Affect is normal.  Laboratory Analysis: Labs from 02/08/2023: Sodium 139, potassium 4.6, chloride 102, CO2 24, BUN 21, creatinine 0.87.  LFTs within normal limits.  WBC 7.1 hemoglobin 15.6, hematocrit 48.2, platelets 286.  Imaging: MRI right femur on 01/31/2023: IMPRESSION: 1. 11.7 x 5.4 x 3.5 cm fatty lesion located in the upper to mid medial anterior thigh. This appears to originate between the rectus femoris and the vastus medialis muscles. 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 This could be followed clinically and reimaged if any change in clinical exam (lesion enlarges or becomes fixed, firm or nodular). Depending on the clinical findings this could also be referred to orthopedic oncologist for potential excision. 2. No inguinal lymphadenopathy. 3. No significant intrapelvic findings.  Assessment and Plan: This is a 62 y.o. male with a large right thigh lipoma, 2 smaller right thigh lipomas, and a right forearm lipoma.  - Discussed with patient the findings on his MRI which show findings consistent with a lipoma.  On exam today, or masses palpable are soft, nontender, somewhat mobile consistent also with diagnosis of lipoma.  Discussed with patient that these are benign masses.  However the patient's main concern is related to the rapid growth.  Discussed with him that potentially this mass was actually smaller and present for longer time but not noticeable because it was intramuscular and now that has grown bigger and is more noticeable.  However due to concern for increasing size, would recommend proceeding with excision.  Discussed with the patient that we can  remove all 4 lipomas in the same setting which would be in the operating room under general anesthesia.  The patient is in agreement. - Discussed with him then the plan for excision of right thigh lipomas x 3 and right forearm lipoma x 1.  Reviewed with patient the surgery at length including the planned incisions, the risks of bleeding, infection, injury to surrounding structures, that this would be an outpatient procedure, postoperative  activity restrictions, pain control, and he is willing to proceed. - We will schedule the patient for surgery on 03/22/2023.  Will send for cardiology clearance as a precaution.  The patient reports that he only takes aspirin intermittently and his last dose was last week.  Discussed with him that he may hold aspirin for 5 days prior to surgery.  I spent 30 minutes dedicated to the care of this patient on the date of this encounter to include pre-visit review of records, face-to-face time with the patient discussing diagnosis and management, and any post-visit coordination of care.   Howie Ill, MD [facility]

## 2023-03-15 NOTE — Telephone Encounter (Signed)
Faxed cardiac clearance to Dr. Harrell Gave End at [phone].

## 2023-03-15 NOTE — Telephone Encounter (Signed)
[name] 62 year old male is requesting preoperative cardiac evaluation for excision of right thigh lipoma.  Surgery scheduled for 03/22/2023.  He was doing fairly well at that time.  He reported no angina.  He continued in clinical trial.  He was noted to be euvolemic.  His PMH includes nonobstructive CAD, chronic CHF with mildly reduced EF, PSVT, unintentional weight loss, upper back pain, and it was felt that he could return to clinic in 6 months.  From a cardiac standpoint can you weigh in on upcoming surgery?  Thank you for your help.  Please direct your response to CV DIV preop pool.  Thomasene Ripple. Jaylynn Siefert NP-C     03/15/2023, 4:30 PM [facility] [address] Office [phone] Fax [phone]

## 2023-03-16 ENCOUNTER — Encounter: Payer: Self-pay | Admitting: Surgery

## 2023-03-16 ENCOUNTER — Telehealth: Payer: Self-pay | Admitting: Surgery

## 2023-03-16 NOTE — Progress Notes (Unsigned)
Received cardiology clearance from Edd Fabian, NP-C at [facility]. He is cleared at low risk for surgery. He  may hold his aspirin for 5-7 days prior to surgery. All notes are in Epic.

## 2023-03-16 NOTE — Telephone Encounter (Signed)
Patient has been advised of Pre-Admission date/time, and Surgery date at [facility].  Surgery Date: 03/22/23 Preadmission Testing Date: 03/17/23 (phone 8a-1p)  Patient has been made aware to call [phone], between 1-3:00pm the day before surgery, to find out what time to arrive for surgery.

## 2023-03-16 NOTE — Telephone Encounter (Signed)
     Primary Cardiologist: [name], MD  Chart reviewed as part of pre-operative protocol coverage. Given past medical history and time since last visit, based on ACC/AHA guidelines, [name] would be at acceptable risk for the planned procedure without further cardiovascular testing.   He was recently seen in the cardiology clinic on 01/23/2023.  He remained stable from a cardiac standpoint at that time.  His aspirin may be held for 5 to 7 days prior to his surgery.  Please resume as soon as hemostasis is achieved.  His RCRI is moderate risk, 6.6% risk of major cardiac event.  I will route this recommendation to the requesting party via Epic fax function and remove from pre-op pool.  Please call with questions.  Thomasene Ripple. Shakesha Soltau NP-C     03/16/2023, 9:05 AM [facility] [address] Office [phone] Fax [phone]

## 2023-03-17 ENCOUNTER — Encounter: Payer: Self-pay | Admitting: Surgery

## 2023-03-17 ENCOUNTER — Encounter
Admission: RE | Admit: 2023-03-17 | Discharge: 2023-03-17 | Disposition: A | Discharge status: Home / Self Care | Payer: BC Managed Care – PPO | Source: Ambulatory Visit | Attending: Surgery | Admitting: Surgery

## 2023-03-17 ENCOUNTER — Other Ambulatory Visit: Payer: Self-pay

## 2023-03-17 DIAGNOSIS — R8289 Other abnormal findings on cytological and histological examination of urine: Secondary | ICD-10-CM

## 2023-03-17 HISTORY — DX: Other forms of dyspnea: R06.09

## 2023-03-17 HISTORY — DX: Esophageal obstruction: K22.2

## 2023-03-17 HISTORY — DX: Mixed hyperlipidemia: E78.2

## 2023-03-17 HISTORY — DX: Other dysphagia: R13.19

## 2023-03-17 HISTORY — DX: Palpitations: R00.2

## 2023-03-17 NOTE — Patient Instructions (Addendum)
Your procedure is scheduled on: Wednesday December 11 Report to the Registration Desk on the 1st floor of the [facility] To find out your arrival time, please call [phone] between 1PM - 3PM on:  Tuesday December 10 If your arrival time is 6:00 am, do not arrive before that time as the [facility] entrance doors do not open until 6:00 am.  REMEMBER: Instructions that are not followed completely may result in serious medical risk, up to and including death; or upon the discretion of your surgeon and anesthesiologist your surgery may need to be rescheduled.  Do not eat food after midnight the night before surgery.  No gum chewing or hard candies.  You may however, drink CLEAR liquids up to 2 hours before you are scheduled to arrive for your surgery. Do not drink anything within 2 hours of your scheduled arrival time.  Clear liquids include: - water  - apple juice without pulp - gatorade (not RED colors) - black coffee or tea (Do NOT add milk or creamers to the coffee or tea) Do NOT drink anything that is not on this list.   One week prior to surgery: Wednesday December 4  Stop Anti-inflammatories (NSAIDS) such as Advil, Aleve, Ibuprofen, Motrin, Naproxen, Naprosyn and Aspirin based products such as Excedrin, Goody's Powder, BC Powder. Stop ANY OVER THE COUNTER supplements until after surgery.  You may however, continue to take Tylenol if needed for pain up until the day of surgery.  **Follow guidelines for insulin and diabetes medications.** JARDIANCE hold 3 days, last dose Saturday December 7   **Follow recommendations regarding stopping blood thinners.** aspirin 81 hold prior to surgery last dose  Thursday December  5   Continue taking all of your other prescription medications up until the day of surgery.  ON THE DAY OF SURGERY ONLY TAKE THESE MEDICATIONS WITH SIPS OF WATER:  atorvastatin (LIPITOR)  famotidine (PEPCID)  metoprolol tartrate (LOPRESSOR)   No Alcohol  for 24 hours before or after surgery.  No Smoking including e-cigarettes for 24 hours before surgery.  No chewable tobacco products for at least 6 hours before surgery.  No nicotine patches on the day of surgery.  Do not use any "recreational" drugs for at least a week (preferably 2 weeks) before your surgery.  Please be advised that the combination of cocaine and anesthesia may have negative outcomes, up to and including death. If you test positive for cocaine, your surgery will be cancelled.  On the morning of surgery brush your teeth with toothpaste and water, you may rinse your mouth with mouthwash if you wish. Do not swallow any toothpaste or mouthwash.  Use CHG Soap or wipes as directed on instruction sheet.  Do not wear jewelry, make-up, hairpins, clips or nail polish.  For welded (permanent) jewelry: bracelets, anklets, waist bands, etc.  Please have this removed prior to surgery.  If it is not removed, there is a chance that hospital personnel will need to cut it off on the day of surgery.  Do not wear lotions, powders, or perfumes.   Do not shave body hair from the neck down 48 hours before surgery.  Contact lenses, hearing aids and dentures may not be worn into surgery.  Do not bring valuables to the hospital. [facility] is not responsible for any missing/lost belongings or valuables.   Notify your doctor if there is any change in your medical condition (cold, fever, infection).  Wear comfortable clothing (specific to your surgery type) to the  hospital.  After surgery, you can help prevent lung complications by doing breathing exercises.  Take deep breaths and cough every 1-2 hours.    If you are being discharged the day of surgery, you will not be allowed to drive home. You will need a responsible individual to drive you home and stay with you for 24 hours after surgery.   If you are taking public transportation, you will need to have a responsible individual with  you.  Please call the Pre-admissions Testing Dept. at [phone] if you have any questions about these instructions.  Surgery Visitation Policy:  Patients having surgery or a procedure may have two visitors.  Children under the age of 3 must have an adult with them who is not the patient.           Preparing for Surgery with CHLORHEXIDINE GLUCONATE (CHG) Soap  Chlorhexidine Gluconate (CHG) Soap  o An antiseptic cleaner that kills germs and bonds with the skin to continue killing germs even after washing  o Used for showering the night before surgery and morning of surgery  Before surgery, you can play an important role by reducing the number of germs on your skin.  CHG (Chlorhexidine gluconate) soap is an antiseptic cleanser which kills germs and bonds with the skin to continue killing germs even after washing.  Please do not use if you have an allergy to CHG or antibacterial soaps. If your skin becomes reddened/irritated stop using the CHG.  1. Shower the NIGHT BEFORE SURGERY and the MORNING OF SURGERY with CHG soap.  2. If you choose to wash your hair, wash your hair first as usual with your normal shampoo.  3. After shampooing, rinse your hair and body thoroughly to remove the shampoo.  4. Use CHG as you would any other liquid soap. You can apply CHG directly to the skin and wash gently with a scrungie or a clean washcloth.  5. Apply the CHG soap to your body only from the neck down. Do not use on open wounds or open sores. Avoid contact with your eyes, ears, mouth, and genitals (private parts). Wash face and genitals (private parts) with your normal soap.  6. Wash thoroughly, paying special attention to the area where your surgery will be performed.  7. Thoroughly rinse your body with warm water.  8. Do not shower/wash with your normal soap after using and rinsing off the CHG soap.  9. Pat yourself dry with a clean towel.  10. Wear clean pajamas to bed the  night before surgery.  12. Place clean sheets on your bed the night of your first shower and do not sleep with pets.  13. Shower again with the CHG soap on the day of surgery prior to arriving at the hospital.  14. Do not apply any deodorants/lotions/powders.  15. Please wear clean clothes to the hospital.

## 2023-03-17 NOTE — Telephone Encounter (Signed)
See result note from [facility].

## 2023-03-17 NOTE — Telephone Encounter (Signed)
Spoke with patient and scheduled on 03/20/23

## 2023-03-20 ENCOUNTER — Other Ambulatory Visit: Payer: BC Managed Care – PPO

## 2023-03-20 ENCOUNTER — Encounter: Payer: Self-pay | Admitting: Surgery

## 2023-03-20 DIAGNOSIS — R8289 Other abnormal findings on cytological and histological examination of urine: Secondary | ICD-10-CM

## 2023-03-20 NOTE — Progress Notes (Signed)
Perioperative / Anesthesia Services  Pre-Admission Testing Clinical Review / Pre-Operative Anesthesia Consult  Date: 03/21/23  Patient Demographics:  Name: [name] DOB:   04/02/1961 MRN:   [id number]  Planned Surgical Procedure(s):    Case: [id number] Date/Time: 03/22/23 1424   Procedure: EXCISION LIPOMA multiple, right thigh lipomas x 3, right forearm x 1 (Right)   Anesthesia type: General   Pre-op diagnosis:      right thigh subfacial lipoma 11.5 cm      right thigh subcutaneous lipomas x 2 less 3 cm      righ forearm subcutaneous lipoma less 3 cm   Location: [facility] OR ROOM 04 / [facility]   Surgeons: Henrene Dodge, MD     NOTE: Available PAT nursing documentation and vital signs have been reviewed. Clinical nursing staff has updated patient's PMH/PSHx, current medication list, and drug allergies/intolerances to ensure comprehensive history available to assist in medical decision making as it pertains to the aforementioned surgical procedure and anticipated anesthetic course. Extensive review of available clinical information personally performed. [place] [facility] and PSHx updated with any diagnoses/procedures that  may have been inadvertently omitted during his intake with the pre-admission testing department's nursing staff.  Clinical Discussion:  [name] is a 62 y.o. male who is submitted for pre-surgical anesthesia review and clearance prior to him undergoing the above procedure. Patient has never been a smoker. 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   Patient is followed by cardiology (End, MD). He was last seen in the cardiology clinic on 01/23/2023; notes reviewed. At the time of his  clinic visit, patient doing well overall from a cardiovascular perspective.  Patient reporting episodes of being lightheaded in dizzy.  He additionally noted infrequent episodes of tachycardia. Patient denied any chest pain, shortness of breath, PND, orthopnea, palpitations, significant peripheral edema, weakness, fatigue, vertiginous symptoms, or presyncope/syncope. Patient with a past medical history significant for cardiovascular diagnoses. Documented physical exam was grossly benign, providing no evidence of acute exacerbation and/or decompensation of the patient's known cardiovascular conditions.  Diagnostic LEFT heart catheterization performed on 10/17/2013 revealed a normal left ventricular systolic function with an EF of 65%.  There was mild single-vessel nonobstructive CAD noted; 20% mid LAD.  Given the nonobstructive nature of his coronaries, the decision was made to defer intervention opting for medical management.   Patient underwent placement of a Medtronic Linq (ILR) device on 12/09/2013.  ILR documented episode of NSVT in 05/2014.  Patient has undergone further testing since this time.  Long-term cardiac event monitor study performed on 10/25/2021 revealing a predominant underlying sinus rhythm at an average rate of 76 bpm; range 50-226 bpm.  There were 2 runs of NSVT noted with the fastest interval lasting 6 beats at a maximum rate of 197 bpm.  Additionally, there were 148 SVT runs with the fastest interval lasting 8 seconds at a maximum rate of 226 bpm, and the longest lasting 55.8 seconds at an average rate of 153 bpm.  There were occasional PVCs (15,761) accounting for a 1% study burden.  Rare PVC couplets (349) and triplets (6) accounting for < 1.0% study burden also noted.   Myocardial perfusion imaging study was performed on 10/07/2020 revealing a low normal left ventricular systolic function with an EF of 50%.  There was no evidence of stress-induced myocardial ischemia or arrhythmia; no  scintigraphic evidence of scar.  Study determined to be normal and low risk.  Coronary CTA was performed on 11/16/2020 that demonstrated an Agatston coronary artery calcium score of 108. This placed patient in the 66th percentile for age, sex, and race matched controls. Calcium depositions noted to be isolated mainly in the pLAD (25%) and dRCA (<25%) distributions.  Study demonstrates normal coronary origin with RIGHT dominance. Aortic atherosclerosis was observed.   Most recent TTE was performed on 08/06/2021 revealing a mildly reduced left ventricular systolic function with an EF of 45-50%.  There were no regional wall motion abnormalities. Left ventricular diastolic Doppler parameters consistent with pseudonormalization (G2DD). GLS -16.5%.  There was mild mitral valve regurgitation.  There was no evidence of a significant transvalvular gradient to suggest stenosis.  Aortic root found to be mildly dilated at 37 mm.  Patient is on GDMT for his nonischemic cardiomyopathy and resulting HFmrEF diagnosis.  Patient well-controlled on currently prescribed beta-blocker (metoprolol tartrate), diuretic (spironolactone, and ARB/ARNI (Entresto) therapies.  Blood pressure documented at 102/80 mmHg.  Additionally, patient is on an SGLT2i (empagliflozin) for added cardiovascular and renovascular protection.  Patient takes atorvastatin for his HLD diagnosis and further ASCVD prevention. Patient does not have an OSAH diagnosis. Patient is able to complete all of his  ADL/IADLs without cardiovascular limitation.  Per the DASI, patient is able to achieve at least 4 METS of physical activity without experiencing any significant degree of angina/anginal equivalent symptoms.  No changes were made to his medication regimen.  Patient to follow-up with outpatient cardiology in 6 months or sooner if needed.  [name] is scheduled for an EXCISION LIPOMA MULTIPLE, RIGHT THIGH LIPOMAS X 3, RIGHT FOREARM X 1 (RIGHT) on 03/22/2023  with Dr. Henrene Dodge, MD.  Given patient's past medical history significant for cardiovascular diagnoses, presurgical cardiac clearance was sought by the PAT team. Per cardiology, "RCRI is moderate risk, which is associated with a 6.6% perioperative cardiac risk. Based ACC/AHA guidelines, the patient's past medical history, and the amount of time since his last clinic visit, this patient would be at an overall ACCEPTABLE risk for the planned procedure without further cardiovascular testing or intervention at this time".  In review of his medication reconciliation, it is noted that patient is currently on prescribed daily low dose ASA therapy. He has been instructed on recommendations for holding his ASA for 7 days prior to his procedure with plans to restart as soon as postoperative bleeding risk felt to be minimized by his attending surgeon. The patient has been instructed that his last dose of his ASA should be on 03/14/2023.  Patient denies previous perioperative complications with anesthesia in the past. In review of the available records, it is noted that patient underwent a general anesthetic course here at [facility] (ASA III) in 01/2023 without documented complications.      03/17/2023   11:51 AM 03/15/2023    1:56 PM 02/08/2023    9:48 AM  Vitals with BMI  Height 6\' 3"  6\' 3"  6' 2.5"  Weight 180 lbs 3 oz 180 lbs 3 oz 180 lbs 6 oz  BMI 22.52 22.52 22.86  Systolic  105 92  Diastolic  70 58  Pulse  58 61    Providers/Specialists:   NOTE: Primary physician provider listed below. Patient may have been seen by APP or partner within same practice.   PROVIDER ROLE / SPECIALTY  LAST Eligha Bridegroom, MD  General Surgery (Surgeon)  03/15/2023  Larae Grooms, NP Primary Care Provider  02/08/2023  [name], MD Cardiology  01/23/2023; update call with preop APP on 03/16/2023   Allergies:  Amoxicillin  Current Home Medications:    Study -  VICTORION-1 PREVENT - inclisiran 300 mg/1.51mL or placebo SQ injection (PI-Stuckey)    alendronate (FOSAMAX) 70 MG tablet   atorvastatin (LIPITOR) 40 MG tablet   famotidine (PEPCID) 20 MG tablet   JARDIANCE 10 MG TABS tablet   metoprolol tartrate (LOPRESSOR) 25 MG tablet   omeprazole (PRILOSEC) 20 MG capsule   sacubitril-valsartan (ENTRESTO) 24-26 MG   spironolactone (ALDACTONE) 25 MG tablet   aspirin 81 MG chewable tablet   History:   Past Medical History:  Diagnosis Date   Anginal pain (HCC) 10/16/2013   Aortic atherosclerosis (HCC) 11/16/2020   Ascending aorta dilatation (HCC) 09/29/2020   a.) TTE 09/29/2020: Ao root 37 mm, asc Ao 39 mm, Ao arch 33 mm; b.) TTE 08/06/2021: Ao root 37 mm   BPH with obstruction/lower urinary tract symptoms 01/2022   a.) s/p TURP 01/2022   CAD (coronary artery disease) 10/17/2013   a.) LHC 10/17/2013: EF 65%. 20% mLAD - med mgmt; b.) MV 10/07/2020: no ischemia; c.) cCTA 11/16/2020: Ca2+ 108 (66% ile); 25% pLAD, < 25% dRCA   DOE (dyspnea on exertion)    Esophageal dysphagia    Family history of blood clots    a.) hypercoagulable work-up 09/2021 was negative; PT gene mutation, Factor V leiden mutation, Protein C, S, and AT III all normal/negative   Fatigue due to sleep pattern disturbance 10/16/2013   GERD (gastroesophageal reflux disease) 2023   Heart failure with mid-range ejection fraction (HFmEF) (HCC) 09/29/2020   a.) TTE 09/29/2020: EF 45-50%, sev bas-mid inferior HK, GLS -15.5%, mild MR, G1DD; b.) TTE 08/06/2021: EF 45-50%, no RWMAs, GLS -16.5%, mild MR, G2DD   History of 2019 novel coronavirus disease (COVID-19) 01/2020   Hypertension 1976   only on meds for a year   Implantable loop recorder 12/09/2013   a.) Medtronic ILR device   Long term current use of aspirin    Mixed hyperlipidemia    Multiple lipomas    NICM (nonischemic cardiomyopathy) (HCC)    a.) TTE 10/08/2014: EF 50-55%; b.) TTE 09/29/2020: EF 45-50%; c.) TTE 08/06/2021: EF  45-50%   Nonsustained ventricular tachycardia (HCC) 05/2014   a.) noted on ILR in 05/2014; b.) holter 10/2021: NSVT x 2 episodes; longest lasted 6 beats at a rate of 197 bpm   Osteoporosis    a.) history of associated rib fracture; b.) on oral bisphosphonate therapy   Palpitations (PVCs)    a.) Zio 10/25/2021: occasional SVE (15,761, 1.0%), SVE couplets (349, <1.0%), and SVE triplets (6. <1.0%)   Pneumonia due to COVID-19 virus 01/2020   a.) hospitalized and Tx'd with MAB infusions   PSVT (paroxysmal supraventricular tachycardia) (HCC) 10/2021   a.) holter 10/2021: 148 SVT episodes with max HR 226 bpm   Seasonal allergies    Sinus tachycardia    Stricture and stenosis of esophagus    Syncope 10/16/2013   Past Surgical History:  Procedure Laterality Date   COLONOSCOPY WITH PROPOFOL N/A 11/24/2020   Procedure: COLONOSCOPY WITH PROPOFOL;  Surgeon: [name], MD;  Location: [facility] ENDOSCOPY;  Service: Endoscopy;  Laterality: N/A;   COLONOSCOPY WITH PROPOFOL N/A 01/18/2023   Procedure: COLONOSCOPY WITH PROPOFOL;  Surgeon: [name], MD;  Location: [facility] ENDOSCOPY;  Service: Endoscopy;  Laterality: N/A;   ESOPHAGEAL DILATION  11/29/2022   Procedure: ESOPHAGEAL DILATION;  Surgeon: [name], MD;  Location: [facility] ENDOSCOPY;  Service: Endoscopy;;   ESOPHAGOGASTRODUODENOSCOPY (EGD) WITH PROPOFOL N/A 11/29/2022   Procedure: ESOPHAGOGASTRODUODENOSCOPY (EGD) WITH PROPOFOL;  Surgeon: [name], MD;  Location: [facility] ENDOSCOPY;  Service: Endoscopy;  Laterality: N/A;   LEFT HEART CATHETERIZATION WITH CORONARY ANGIOGRAM N/A 10/17/2013   Procedure: LEFT HEART CATHETERIZATION WITH CORONARY ANGIOGRAM;  Surgeon: Kathleene Hazel, MD;  Location: [facility] CATH LAB;  Service: Cardiovascular;  Laterality: N/A;   LOOP RECORDER IMPLANT N/A 12/09/2013   Procedure: LOOP RECORDER IMPLANT;  Surgeon: Duke Salvia, MD;  Location: [facility] CATH LAB;  Service: Cardiovascular;  Laterality: N/A;   TRANSURETHRAL RESECTION  OF PROSTATE N/A 01/10/2022   Procedure: TRANSURETHRAL RESECTION OF THE PROSTATE (TURP);  Surgeon: Vanna Scotland, MD;  Location: [facility] ORS;  Service: Urology;  Laterality: N/A;   Family History  Problem Relation Age of Onset   Hypertension Mother    Arrhythmia Mother    Heart disease Mother    Parkinson's disease Mother    Alzheimer's disease Mother    Deep vein thrombosis Father    Colon cancer Father    Cancer Father    Early death Father    Sudden death Maternal Grandfather 45   ADD / ADHD Paternal Aunt    Deep vein thrombosis Paternal Aunt    Deep vein thrombosis Paternal Uncle    Cancer Paternal Uncle    ADD / ADHD Brother    Social History   Tobacco Use   Smoking status: Never    Passive exposure: Never   Smokeless tobacco: Never  Vaping Use   Vaping status: Never Used  Substance Use Topics   Alcohol use: Never   Drug use: No    Pertinent Clinical Results:  LABS:  Lab Results  Component Value Date   WBC 7.1 02/08/2023   HGB 15.6 02/08/2023   HCT 48.2 02/08/2023   MCV 97 02/08/2023   PLT 286 02/08/2023   Lab Results  Component Value Date   NA 140 03/20/2023   K 4.6 03/20/2023   CO2 23 03/20/2023   GLUCOSE 84 03/20/2023   BUN 14 03/20/2023   CREATININE 0.82 03/20/2023   CALCIUM 8.9 03/20/2023   GFR 120.46 12/03/2013   EGFR 98 03/20/2023   GFRNONAA >60 01/05/2022    Component Date Value Ref Range Status   Specific Gravity, UA 03/13/2023 1.020  1.005 - 1.030 Final   pH, UA 03/13/2023 7.0  5.0 - 7.5 Final   Color, UA 03/13/2023 Yellow  Yellow Final   Appearance Ur 03/13/2023 Clear  Clear Final   Leukocytes,UA 03/13/2023 Negative  Negative Final   Protein,UA 03/13/2023 Negative  Negative/Trace Final   Glucose, UA 03/13/2023 3+ (A)  Negative Final   Ketones, UA 03/13/2023 Negative  Negative Final   RBC, UA 03/13/2023 Negative  Negative Final   Bilirubin, UA 03/13/2023 Negative  Negative Final   Urobilinogen, Ur 03/13/2023 4.0 (H)  0.2 - 1.0 mg/dL  Final   Nitrite, UA 16/01/9603 Negative  Negative Final   Microscopic Examination 03/13/2023 See below:   Final   WBC, UA 03/13/2023 0-5  0 - 5 /hpf Final   RBC, Urine 03/13/2023 0-2  0 - 2 /hpf Final   Epithelial Cells (non renal) 03/13/2023 0-10  0 - 10 /hpf Final   Bacteria, UA 03/13/2023 None seen  None seen/Few Final    ECG: Date: 01/23/2023 Time ECG obtained: 0929  PM Rate: 57 bpm Rhythm: sinus bradycardia Axis (leads I and aVF): Normal Intervals: PR 118 ms. QRS 102 ms. QTc 395 ms. ST segment and T wave changes: No evidence of acute ST segment elevation or depression.  Comparison: Similar to previous tracing obtained on 11/24/2022   IMAGING / PROCEDURES: LONG TERM CARDIAC EVENT MONITOR STUDY performed on 10/25/2021 Patch Wear Time:  14 days and 0 hours   Patient had a min HR of 50 bpm, max HR of 226 bpm, and avg HR of 76 bpm. Predominant underlying rhythm was Sinus Rhythm.  2 Ventricular Tachycardia runs occurred, the run with the fastest interval lasting 6 beats with a max rate of 197 bpm (avg 176 bpm); the run with the fastest interval was also the longest.  148 Supraventricular Tachycardia runs occurred, the run with the fastest interval lasting 8.0 seconds with a max rate of 226 bpm, the longest lasting 55.8 seconds with an avg rate of 153 bpm. Isolated SVEs were rare (<1.0%), SVE Couplets were rare (<1.0%), and SVE Triplets were rare (<1.0%). Isolated VEs were occasional (1.0%, 15761), VE Couplets were rare (<1.0%, 349), and VE Triplets were rare (<1.0%, 6). Ventricular Bigeminy and Trigeminy were present.  Triggered events associated with sinus rhythm  Continue current meds    TRANSTHORACIC ECHOCARDIOGRAM performed on 08/06/2021 Left ventricular ejection fraction, by estimation, is 45 to 50%. The left ventricle has mildly decreased function. The left ventricle has no regional wall motion abnormalities. Left ventricular diastolic parameters are consistent with Grade II  diastolic dysfunction (pseudonormalization). The average left ventricular global longitudinal strain is -16.5 %.  Right ventricular systolic function is normal. The right ventricular size is normal. Tricuspid regurgitation signal is inadequate for assessing PA pressure.  The mitral valve is normal in structure. Mild mitral valve regurgitation. No evidence of mitral stenosis.  The aortic valve is tricuspid. Aortic valve regurgitation is not visualized. No aortic stenosis is present.  There is borderline dilatation of the aortic root and of the ascending aorta, measuring 37 mm.  The inferior vena cava is normal in size with greater than 50% respiratory variability, suggesting right atrial pressure of 3 mmHg.    CT CORONARY MORPH W/CTA COR W/SCORE W/CA W/CM &/OR WO/CM performed on 11/16/2020 Coronary calcium score of 108. This was 66th percentile for age and sex matched control. Normal coronary origin with right dominance. Mild proximal LAD disease (25%), minimal distal RCA disease (<25%). CAD-RADS 2. Mild non-obstructive CAD (25-49%). Consider non-atherosclerotic causes of chest pain. Consider preventive therapy and risk factor modification.   MYOCARDIAL PERFUSION IMAGING STUDY (LEXISCAN) performed on 10/07/2020 Normal left ventricular systolic function with a low normal LVEF of 50% Normal myocardial thickening and wall motion Left ventricular cavity size normal SPECT images demonstrate homogenous tracer distribution throughout the myocardium No evidence of stress-induced myocardial ischemia or arrhythmia Normal low risk study  Impression and Plan:  DMARIO GANGWER has been referred for pre-anesthesia review and clearance prior to him undergoing the planned anesthetic and procedural courses. Available labs, pertinent testing, and imaging results were personally reviewed by me in preparation for upcoming operative/procedural course. [facility] medical record has been updated following extensive  record review and patient interview with PAT staff.   This patient has been appropriately cleared by cardiology with an overall ACCEPTABLE risk of experiencing significant perioperative cardiovascular complications. Based on clinical review performed today (03/21/23), barring any significant acute changes in the patient's overall condition, it is anticipated that he will be able to proceed  with the planned surgical intervention. Any acute changes in clinical condition may necessitate his procedure being postponed and/or cancelled. Patient will meet with anesthesia team (MD and/or CRNA) on the day of his procedure for preoperative evaluation/assessment. Questions regarding anesthetic course will be fielded at that time.   Pre-surgical instructions were reviewed with the patient during his PAT appointment, and questions were fielded to satisfaction by PAT clinical staff. He has been instructed on which medications that he will need to hold prior to surgery, as well as the ones that have been deemed safe/appropriate to take on the day of his procedure. As part of the general education provided by PAT, patient made aware both verbally and in writing, that he would need to abstain from the use of any illegal substances during his perioperative course.  He was advised that failure to follow the provided instructions could necessitate case cancellation or result in serious perioperative complications up to and including death. Patient encouraged to contact PAT and/or his surgeon's office to discuss any questions or concerns that may arise prior to surgery; verbalized understanding.   Quentin Mulling, MSN, APRN, FNP-C, CEN [facility]  Perioperative Services Nurse Practitioner Phone: [phone] Fax: [phone] 03/21/23 8:32 AM  NOTE: This note has been prepared using Dragon dictation software. Despite my best ability to proofread, there is always the potential that unintentional  transcriptional errors may still occur from this process.

## 2023-03-21 LAB — BASIC METABOLIC PANEL
BUN/Creatinine Ratio: 17 (ref 10–24)
BUN: 14 mg/dL (ref 8–27)
CO2: 23 mmol/L (ref 20–29)
Calcium: 8.9 mg/dL (ref 8.6–10.2)
Chloride: 103 mmol/L (ref 96–106)
Creatinine, Ser: 0.82 mg/dL (ref 0.76–1.27)
Glucose: 84 mg/dL (ref 70–99)
Potassium: 4.6 mmol/L (ref 3.5–5.2)
Sodium: 140 mmol/L (ref 134–144)
eGFR: 98 mL/min/{1.73_m2} (ref >59)

## 2023-03-22 ENCOUNTER — Encounter
Admission: RE | Disposition: A | Discharge status: Home / Self Care | Payer: Self-pay | Source: Home / Self Care | Attending: Surgery

## 2023-03-22 ENCOUNTER — Ambulatory Visit: Payer: Self-pay | Admitting: Urgent Care

## 2023-03-22 ENCOUNTER — Ambulatory Visit
Admission: RE | Admit: 2023-03-22 | Discharge: 2023-03-22 | Disposition: A | Discharge status: Home / Self Care | Payer: BC Managed Care – PPO | Attending: Surgery | Admitting: Surgery

## 2023-03-22 ENCOUNTER — Other Ambulatory Visit: Payer: Self-pay

## 2023-03-22 ENCOUNTER — Telehealth: Payer: Self-pay | Admitting: *Deleted

## 2023-03-22 ENCOUNTER — Encounter: Payer: Self-pay | Admitting: Surgery

## 2023-03-22 DIAGNOSIS — R1310 Dysphagia, unspecified: Secondary | ICD-10-CM | POA: Insufficient documentation

## 2023-03-22 DIAGNOSIS — K219 Gastro-esophageal reflux disease without esophagitis: Secondary | ICD-10-CM | POA: Diagnosis not present

## 2023-03-22 DIAGNOSIS — E782 Mixed hyperlipidemia: Secondary | ICD-10-CM | POA: Diagnosis not present

## 2023-03-22 DIAGNOSIS — Z79899 Other long term (current) drug therapy: Secondary | ICD-10-CM | POA: Diagnosis not present

## 2023-03-22 DIAGNOSIS — Z7982 Long term (current) use of aspirin: Secondary | ICD-10-CM | POA: Insufficient documentation

## 2023-03-22 DIAGNOSIS — D179 Benign lipomatous neoplasm, unspecified: Secondary | ICD-10-CM | POA: Diagnosis not present

## 2023-03-22 DIAGNOSIS — D1723 Benign lipomatous neoplasm of skin and subcutaneous tissue of right leg: Secondary | ICD-10-CM

## 2023-03-22 DIAGNOSIS — Z6822 Body mass index (BMI) 22.0-22.9, adult: Secondary | ICD-10-CM | POA: Diagnosis not present

## 2023-03-22 DIAGNOSIS — I2510 Atherosclerotic heart disease of native coronary artery without angina pectoris: Secondary | ICD-10-CM | POA: Diagnosis not present

## 2023-03-22 DIAGNOSIS — I472 Ventricular tachycardia, unspecified: Secondary | ICD-10-CM | POA: Insufficient documentation

## 2023-03-22 DIAGNOSIS — D1721 Benign lipomatous neoplasm of skin and subcutaneous tissue of right arm: Secondary | ICD-10-CM | POA: Diagnosis not present

## 2023-03-22 DIAGNOSIS — R634 Abnormal weight loss: Secondary | ICD-10-CM | POA: Insufficient documentation

## 2023-03-22 DIAGNOSIS — R2241 Localized swelling, mass and lump, right lower limb: Secondary | ICD-10-CM

## 2023-03-22 DIAGNOSIS — I471 Supraventricular tachycardia, unspecified: Secondary | ICD-10-CM | POA: Insufficient documentation

## 2023-03-22 DIAGNOSIS — I340 Nonrheumatic mitral (valve) insufficiency: Secondary | ICD-10-CM | POA: Diagnosis not present

## 2023-03-22 DIAGNOSIS — I110 Hypertensive heart disease with heart failure: Secondary | ICD-10-CM | POA: Diagnosis not present

## 2023-03-22 DIAGNOSIS — I509 Heart failure, unspecified: Secondary | ICD-10-CM | POA: Diagnosis not present

## 2023-03-22 HISTORY — DX: Long term (current) use of aspirin: Z79.82

## 2023-03-22 HISTORY — PX: LIPOMA EXCISION: SHX5283

## 2023-03-22 HISTORY — DX: Other seasonal allergic rhinitis: J30.2

## 2023-03-22 HISTORY — DX: Benign lipomatous neoplasm, unspecified: D17.9

## 2023-03-22 SURGERY — EXCISION LIPOMA
Anesthesia: General | Laterality: Right

## 2023-03-22 MED ORDER — GABAPENTIN 300 MG PO CAPS
300.0000 mg | ORAL_CAPSULE | ORAL | Status: AC
Start: 2023-03-23 — End: 2023-03-22
  Administered 2023-03-22: 300 mg via ORAL

## 2023-03-22 MED ORDER — CHLORHEXIDINE GLUCONATE CLOTH 2 % EX PADS
6.0000 | MEDICATED_PAD | Freq: Once | CUTANEOUS | Status: AC
  Administered 2023-03-22: 6 via TOPICAL

## 2023-03-22 MED ORDER — LIDOCAINE HCL (CARDIAC) PF 100 MG/5ML IV SOSY
PREFILLED_SYRINGE | INTRAVENOUS | Status: DC | PRN
  Administered 2023-03-22: 100 mg via INTRAVENOUS

## 2023-03-22 MED ORDER — ACETAMINOPHEN 500 MG PO TABS
1000.0000 mg | ORAL_TABLET | ORAL | Status: AC
  Administered 2023-03-22: 1000 mg via ORAL

## 2023-03-22 MED ORDER — ONDANSETRON HCL 4 MG/2ML IJ SOLN
INTRAMUSCULAR | Status: AC
  Filled 2023-03-22: qty 2

## 2023-03-22 MED ORDER — ACETAMINOPHEN 500 MG PO TABS: 1000.0000 mg | ORAL_TABLET | Freq: Four times a day (QID) | ORAL | Status: AC | PRN

## 2023-03-22 MED ORDER — MIDAZOLAM HCL 2 MG/2ML IJ SOLN
INTRAMUSCULAR | Status: DC | PRN
  Administered 2023-03-22: 1 mg via INTRAVENOUS

## 2023-03-22 MED ORDER — CHLORHEXIDINE GLUCONATE 0.12 % MT SOLN
15.0000 mL | Freq: Once | OROMUCOSAL | Status: AC
Start: 2023-03-22 — End: 2023-03-22
  Administered 2023-03-22: 15 mL via OROMUCOSAL

## 2023-03-22 MED ORDER — OXYCODONE HCL 5 MG/5ML PO SOLN: 5.0000 mg | Freq: Once | ORAL | Status: AC | PRN

## 2023-03-22 MED ORDER — ROCURONIUM BROMIDE 100 MG/10ML IV SOLN
INTRAVENOUS | Status: DC | PRN
  Administered 2023-03-22: 50 mg via INTRAVENOUS

## 2023-03-22 MED ORDER — CEFAZOLIN SODIUM-DEXTROSE 2-4 GM/100ML-% IV SOLN
INTRAVENOUS | Status: AC
  Filled 2023-03-22: qty 100

## 2023-03-22 MED ORDER — LACTATED RINGERS IV SOLN: INTRAVENOUS | Status: DC

## 2023-03-22 MED ORDER — SUGAMMADEX SODIUM 200 MG/2ML IV SOLN
INTRAVENOUS | Status: DC | PRN
  Administered 2023-03-22 (×2): 100 mg via INTRAVENOUS

## 2023-03-22 MED ORDER — BUPIVACAINE LIPOSOME 1.3 % IJ SUSP: 20.0000 mL | Freq: Once | INTRAMUSCULAR | Status: DC

## 2023-03-22 MED ORDER — BUPIVACAINE LIPOSOME 1.3 % IJ SUSP
INTRAMUSCULAR | Status: AC
  Filled 2023-03-22: qty 20

## 2023-03-22 MED ORDER — DEXAMETHASONE SODIUM PHOSPHATE 10 MG/ML IJ SOLN
INTRAMUSCULAR | Status: DC | PRN
  Administered 2023-03-22: 4 mg via INTRAVENOUS

## 2023-03-22 MED ORDER — ONDANSETRON HCL 4 MG/2ML IJ SOLN
INTRAMUSCULAR | Status: DC | PRN
  Administered 2023-03-22: 4 mg via INTRAVENOUS

## 2023-03-22 MED ORDER — OXYCODONE HCL 5 MG PO TABS: 5.0000 mg | ORAL_TABLET | Freq: Four times a day (QID) | ORAL | 0 refills | Status: DC | PRN

## 2023-03-22 MED ORDER — FENTANYL CITRATE (PF) 100 MCG/2ML IJ SOLN
25.0000 ug | INTRAMUSCULAR | Status: DC | PRN
Start: 2023-03-22 — End: 2023-03-22

## 2023-03-22 MED ORDER — CEFAZOLIN SODIUM-DEXTROSE 2-4 GM/100ML-% IV SOLN
2.0000 g | INTRAVENOUS | Status: AC
Start: 2023-03-23 — End: 2023-03-22
  Administered 2023-03-22: 2 g via INTRAVENOUS

## 2023-03-22 MED ORDER — PROPOFOL 10 MG/ML IV BOLUS
INTRAVENOUS | Status: AC
  Filled 2023-03-22: qty 20

## 2023-03-22 MED ORDER — DEXAMETHASONE SODIUM PHOSPHATE 10 MG/ML IJ SOLN
INTRAMUSCULAR | Status: AC
  Filled 2023-03-22: qty 1

## 2023-03-22 MED ORDER — OXYCODONE HCL 5 MG PO TABS
ORAL_TABLET | ORAL | Status: AC
  Filled 2023-03-22: qty 1

## 2023-03-22 MED ORDER — PROPOFOL 1000 MG/100ML IV EMUL
INTRAVENOUS | Status: AC
  Filled 2023-03-22: qty 100

## 2023-03-22 MED ORDER — GABAPENTIN 300 MG PO CAPS
ORAL_CAPSULE | ORAL | Status: AC
  Filled 2023-03-22: qty 1

## 2023-03-22 MED ORDER — FENTANYL CITRATE (PF) 100 MCG/2ML IJ SOLN
INTRAMUSCULAR | Status: DC | PRN
  Administered 2023-03-22: 50 ug via INTRAVENOUS

## 2023-03-22 MED ORDER — ACETAMINOPHEN 500 MG PO TABS
ORAL_TABLET | ORAL | Status: AC
  Filled 2023-03-22: qty 2

## 2023-03-22 MED ORDER — CHLORHEXIDINE GLUCONATE 0.12 % MT SOLN
OROMUCOSAL | Status: AC
  Filled 2023-03-22: qty 15

## 2023-03-22 MED ORDER — ORAL CARE MOUTH RINSE: 15.0000 mL | Freq: Once | OROMUCOSAL | Status: AC

## 2023-03-22 MED ORDER — MIDAZOLAM HCL 2 MG/2ML IJ SOLN
INTRAMUSCULAR | Status: AC
Start: 2023-03-22 — End: ?
  Filled 2023-03-22: qty 2

## 2023-03-22 MED ORDER — SODIUM CHLORIDE FLUSH 0.9 % IV SOLN
INTRAVENOUS | Status: AC
  Filled 2023-03-22: qty 10

## 2023-03-22 MED ORDER — BUPIVACAINE LIPOSOME 1.3 % IJ SUSP
INTRAMUSCULAR | Status: DC | PRN
  Administered 2023-03-22: 50 mL

## 2023-03-22 MED ORDER — FENTANYL CITRATE (PF) 100 MCG/2ML IJ SOLN
INTRAMUSCULAR | Status: AC
  Filled 2023-03-22: qty 2

## 2023-03-22 MED ORDER — BUPIVACAINE HCL (PF) 0.5 % IJ SOLN
INTRAMUSCULAR | Status: AC
  Filled 2023-03-22: qty 30

## 2023-03-22 MED ORDER — PROPOFOL 10 MG/ML IV BOLUS
INTRAVENOUS | Status: DC | PRN
  Administered 2023-03-22: 5 mg via INTRAVENOUS
  Administered 2023-03-22: 150 mg via INTRAVENOUS

## 2023-03-22 MED ORDER — OXYCODONE HCL 5 MG PO TABS
5.0000 mg | ORAL_TABLET | Freq: Once | ORAL | Status: AC | PRN
  Administered 2023-03-22: 5 mg via ORAL

## 2023-03-22 MED ORDER — EPHEDRINE SULFATE-NACL 50-0.9 MG/10ML-% IV SOSY
PREFILLED_SYRINGE | INTRAVENOUS | Status: DC | PRN
  Administered 2023-03-22: 5 mg via INTRAVENOUS

## 2023-03-22 MED ORDER — LIDOCAINE HCL (PF) 2 % IJ SOLN
INTRAMUSCULAR | Status: AC
  Filled 2023-03-22: qty 5

## 2023-03-22 MED ORDER — 0.9 % SODIUM CHLORIDE (POUR BTL) OPTIME
TOPICAL | Status: DC | PRN
  Administered 2023-03-22: 500 mL

## 2023-03-22 SURGICAL SUPPLY — 29 items
CHLORAPREP W/TINT 26 (MISCELLANEOUS) ×1 IMPLANT
DERMABOND ADVANCED .7 DNX12 (GAUZE/BANDAGES/DRESSINGS) ×1 IMPLANT
DRAPE LAPAROTOMY 100X77 ABD (DRAPES) ×1 IMPLANT
DRAPE SHEET LG 3/4 BI-LAMINATE (DRAPES) ×2 IMPLANT
ELECT CAUTERY BLADE TIP 2.5 (TIP) ×1
ELECT REM PT RETURN 9FT ADLT (ELECTROSURGICAL) ×1
ELECTRODE CAUTERY BLDE TIP 2.5 (TIP) ×1 IMPLANT
ELECTRODE REM PT RTRN 9FT ADLT (ELECTROSURGICAL) ×1 IMPLANT
GAUZE 4X4 16PLY ~~LOC~~+RFID DBL (SPONGE) ×1 IMPLANT
GLOVE SURG SYN 7.0 (GLOVE) ×3
GLOVE SURG SYN 7.0 PF PI (GLOVE) ×1 IMPLANT
GLOVE SURG SYN 7.5 E (GLOVE) ×3
GLOVE SURG SYN 7.5 PF PI (GLOVE) ×1 IMPLANT
GOWN STRL REUS W/ TWL LRG LVL3 (GOWN DISPOSABLE) ×2 IMPLANT
KIT TURNOVER KIT A (KITS) ×1 IMPLANT
LABEL OR SOLS (LABEL) ×1 IMPLANT
MANIFOLD NEPTUNE II (INSTRUMENTS) ×1 IMPLANT
NDL HYPO 22X1.5 SAFETY MO (MISCELLANEOUS) ×1 IMPLANT
NEEDLE HYPO 22X1.5 SAFETY MO (MISCELLANEOUS) ×1
NS IRRIG 1000ML POUR BTL (IV SOLUTION) ×1 IMPLANT
PACK BASIN MINOR ARMC (MISCELLANEOUS) ×1 IMPLANT
SUT MNCRL 4-0 27XMFL (SUTURE) ×1
SUT VIC AB 0 SH 27 (SUTURE) ×2 IMPLANT
SUT VIC AB 2-0 SH 27XBRD (SUTURE) IMPLANT
SUT VIC AB 3-0 SH 27X BRD (SUTURE) ×2 IMPLANT
SUTURE MNCRL 4-0 27XMF (SUTURE) ×1 IMPLANT
SYR 30ML LL (SYRINGE) ×1 IMPLANT
TRAP FLUID SMOKE EVACUATOR (MISCELLANEOUS) ×1 IMPLANT
WATER STERILE IRR 500ML POUR (IV SOLUTION) ×1 IMPLANT

## 2023-03-22 NOTE — Op Note (Signed)
Procedure Date:  03/22/2023  Pre-operative Diagnosis:  Right thigh intramuscular lipomas x 1 and subcutaneous lipoma x 2, right forearm subcutaneous lipoma x 1  Post-operative Diagnosis: Right thigh intramuscular lipomas x 1 (10.5 x 7.5 cm) and subcutaneous lipoma x 2 (2.5 cm and 2 cm), right forearm subcutaneous lipoma x 1 (2 cm)  Procedure:  Excision of Right thigh intramuscular lipomas x 1 (10.5 x 7.5 cm) and subcutaneous lipoma x 2 (2.5 cm and 2 cm), right forearm subcutaneous lipoma x 1 (2 cm).  Surgeon:  Howie Ill, MD  Anesthesia:  General endotracheal  Estimated Blood Loss:  10 ml  Specimens:   Right thigh intramuscular lipoma Right thigh proximal lipoma Right thigh distal lipoma Right forearm lipoma  Complications:  None  Indications for Procedure:  This is a 62 y.o. male with diagnosis of a symptomatic lipomas in his right thigh and forearm.  The largest lipoma in his right thigh is in intramuscular location based on MRI imaging.  The patient wishes to have them excised. The risks of bleeding, abscess or infection, injury to surrounding structures, and need for further procedures were all discussed with the patient and he was willing to proceed.  Description of Procedure: The patient was correctly identified in the preoperative area and brought into the operating room.  The patient was placed supine with VTE prophylaxis in place.  Appropriate time-outs were performed.  Anesthesia was induced and the patient was intubated.  Appropriate antibiotics were infused.  The patient's right thigh was prepped and draped in usual sterile fashion.  The location of each lipoma was marked.  We started with the intramuscular lipoma which was about mid thigh.  An 8 cm incision was made over the lipoma, and cautery was used to dissect down the subcutaneous tissue to the muscular fascia.  The fascia was incised exposing the rectus muscle and the lipoma.  Cautery was used to dissect the  muscle fibers off the lipoma and then the lipoma was excised using cautery, intact.  It was sent off to pathology.  The cavity was then irrigated and hemostasis was assured with cautery.  The muscular fascia was closed using 2-0 Vicryl.  Local anesthetic was infused intradermally.  The wound was then closed in three layers using 2-0 vicryl, 3-0 Vicryl and 4-0 Monocryl.  The incision was cleaned and sealed with DermaBond.  We then moved to the proximal thigh.  A 3 cm incision was made over the lipoma, and cautery was used to dissect down the subcutaneous tissue to the lipoma itself.  Skin flaps were created using cautery as well, and then the lipoma was excised using cautery, intact.  It was sent off to pathology.  The cavity was then irrigated and hemostasis was assured with cautery.  Local anesthetic was infused intradermally.  The wound was then closed in two layers using 3-0 Vicryl and 4-0 Monocryl.  The incision was cleaned and sealed with DermaBond.  We then moved to the distal thigh.  A 2 cm incision was made over the lipoma, and cautery was used to dissect down the subcutaneous tissue to the lipoma itself.  Skin flaps were created using cautery as well, and then the lipoma was excised using cautery, intact.  It was sent off to pathology.  The cavity was then irrigated and hemostasis was assured with cautery.  Local anesthetic was infused intradermally.  The wound was then closed in two layers using 3-0 Vicryl and 4-0 Monocryl.  The incision was cleaned  and sealed with DermaBond.  Drapes were removed and the right forearm was prepped and draped in usual sterile fashion.  A 3 cm incision was made over the right forearm lipoma, and cautery was used to dissect down the subcutaneous tissue to the lipoma itself.  Skin flaps were created using cautery as well, and then the lipoma was excised using cautery, intact.  It was sent off to pathology.  The cavity was then irrigated and hemostasis was assured with  cautery.  Local anesthetic was infused intradermally.  The wound was then closed in two layers using 3-0 Vicryl and 4-0 Monocryl.  The incision was cleaned and sealed with DermaBond.  Please note that separate incisions were made for each lipoma.    The patient was then emerged from anesthesia, extubated, and brought to the recovery room for further management.    The patient tolerated the procedure well and all counts were correct at the end of the case.   Howie Ill, MD

## 2023-03-22 NOTE — Telephone Encounter (Signed)
Faxed LOA to [facility] [phone]

## 2023-03-22 NOTE — Anesthesia Procedure Notes (Signed)
Procedure Name: Intubation Date/Time: 03/22/2023 2:19 PM  Performed by: Darrell Jewel I, CRNAPre-anesthesia Checklist: Patient identified, Emergency Drugs available, Suction available, Patient being monitored and Timeout performed Patient Re-evaluated:Patient Re-evaluated prior to induction Oxygen Delivery Method: Circle system utilized Preoxygenation: Pre-oxygenation with 100% oxygen Induction Type: IV induction Ventilation: Mask ventilation without difficulty Laryngoscope Size: Mac and 4 Grade View: Grade I Tube type: Oral Tube size: 7.5 mm Number of attempts: 1 Airway Equipment and Method: Stylet Placement Confirmation: ETT inserted through vocal cords under direct vision, positive ETCO2, CO2 detector and breath sounds checked- equal and bilateral Tube secured with: Tape Dental Injury: Teeth and Oropharynx as per pre-operative assessment

## 2023-03-22 NOTE — Transfer of Care (Signed)
Immediate Anesthesia Transfer of Care Note  Patient: [name]  Procedure(s) Performed: EXCISION LIPOMA multiple, right thigh lipomas x 3, right forearm x 1 (Right)  Patient Location: PACU  Anesthesia Type:General  Level of Consciousness: awake, alert , and oriented  Airway & Oxygen Therapy: Patient Spontanous Breathing and Patient connected to face mask oxygen  Post-op Assessment: Report given to RN, Post -op Vital signs reviewed and stable, and Patient moving all extremities  Post vital signs: stable  Last Vitals:  Vitals Value Taken Time  BP 123/83 03/22/23 1616  Temp 36.2 C 03/22/23 1615  Pulse 79 03/22/23 1624  Resp 13 03/22/23 1624  SpO2 100 % 03/22/23 1624  Vitals shown include unfiled device data.  Last Pain:  Vitals:   03/22/23 1615  TempSrc:   PainSc: 0-No pain         Complications: No notable events documented.

## 2023-03-22 NOTE — Anesthesia Postprocedure Evaluation (Signed)
Anesthesia Post Note  Patient: [name]  Procedure(s) Performed: EXCISION LIPOMA multiple, right thigh lipomas x 3, right forearm x 1 (Right)  Patient location during evaluation: PACU Anesthesia Type: General Level of consciousness: awake and alert Pain management: pain level controlled Vital Signs Assessment: post-procedure vital signs reviewed and stable Respiratory status: spontaneous breathing, nonlabored ventilation, respiratory function stable and patient connected to nasal cannula oxygen Cardiovascular status: blood pressure returned to baseline and stable Postop Assessment: no apparent nausea or vomiting Anesthetic complications: no   No notable events documented.   Last Vitals:  Vitals:   03/22/23 1630 03/22/23 1644  BP: 124/84 (!) 130/94  Pulse: 79 73  Resp: 14 13  Temp:  (!) 36.2 C  SpO2: 100% 100%    Last Pain:  Vitals:   03/22/23 1640  TempSrc:   PainSc: 2                  Cleda Mccreedy Hieu Herms

## 2023-03-22 NOTE — Interval H&P Note (Signed)
History and Physical Interval Note:  03/22/2023 1:59 PM  [name]  has presented today for surgery, with the diagnosis of right thigh subfacial lipoma 11.5 cm  right thigh subcutaneous lipomas x 2 less 3 cm  righ forearm subcutaneous lipoma less 3 cm.  The various methods of treatment have been discussed with the patient and family. After consideration of risks, benefits and other options for treatment, the patient has consented to  Procedure(s): EXCISION LIPOMA multiple, right thigh lipomas x 3, right forearm x 1 (Right) as a surgical intervention.  The patient's history has been reviewed, patient examined, no change in status, stable for surgery.  I have reviewed the patient's chart and labs.  Questions were answered to the patient's satisfaction.     Arlee Santosuosso

## 2023-03-22 NOTE — Discharge Instructions (Addendum)
Discharge Instructions: 1.  Patient may shower, but do not scrub wounds heavily and dab dry only. 2.  Do not submerge wounds in pool/tub until fully healed. 3.  Do not apply ointments or hydrogen peroxide to the wounds. 4.  May apply ice packs to the wounds for comfort. 5.  May resume Aspirin on 03/24/23. 6.  Do not drive while taking narcotics for pain control.  Prior to driving, make sure you are able to rotate right and left to look at blindspots without significant pain or discomfort. 7.  Avoid strenuous activity with the right leg for a period of 6 weeks to allow the muscle to heal.  May ambulate as tolerated and use crutches as needed.

## 2023-03-22 NOTE — Anesthesia Preprocedure Evaluation (Addendum)
Anesthesia Evaluation  Patient identified by MRN, date of birth, ID band Patient awake    Reviewed: Allergy & Precautions, NPO status , Patient's Chart, lab work & pertinent test results  History of Anesthesia Complications Negative for: history of anesthetic complications  Airway Mallampati: III  TM Distance: >3 FB Neck ROM: full    Dental  (+) Poor Dentition, Missing   Pulmonary neg pulmonary ROS   Pulmonary exam normal        Cardiovascular hypertension, (-) angina + CAD, +CHF and + DOE (stable)  + dysrhythmias Supra Ventricular Tachycardia and Ventricular Tachycardia (-) pacemaker  Echo 08/2021 IMPRESSIONS     1. Left ventricular ejection fraction, by estimation, is 45 to 50%. The  left ventricle has mildly decreased function. The left ventricle has no  regional wall motion abnormalities. Left ventricular diastolic parameters  are consistent with Grade II  diastolic dysfunction (pseudonormalization). The average left ventricular  global longitudinal strain is -16.5 %.   2. Right ventricular systolic function is normal. The right ventricular  size is normal. Tricuspid regurgitation signal is inadequate for assessing  PA pressure.   3. The mitral valve is normal in structure. Mild mitral valve  regurgitation. No evidence of mitral stenosis.   4. The aortic valve is tricuspid. Aortic valve regurgitation is not  visualized. No aortic stenosis is present.   5. There is borderline dilatation of the aortic root and of the ascending  aorta, measuring 37 mm.   6. The inferior vena cava is normal in size with greater than 50%  respiratory variability, suggesting right atrial pressure of 3 mmHg.     Neuro/Psych negative neurological ROS  negative psych ROS   GI/Hepatic Neg liver ROS,GERD  Medicated,,Dysphagia   Endo/Other  negative endocrine ROS    Renal/GU negative Renal ROS  negative genitourinary    Musculoskeletal   Abdominal   Peds  Hematology negative hematology ROS (+)   Anesthesia Other Findings Past Medical History: 10/16/2013: Anginal pain (HCC) 11/16/2020: Aortic atherosclerosis (HCC) 09/29/2020: Ascending aorta dilatation (HCC)     Comment:  a.) TTE 09/29/2020: Ao root 37 mm, asc Ao 39 mm, Ao arch              33 mm; b.) TTE 08/06/2021: Ao root 37 mm 01/2022: BPH with obstruction/lower urinary tract symptoms     Comment:  a.) s/p TURP 01/2022 10/17/2013: CAD (coronary artery disease)     Comment:  a.) LHC 10/17/2013: EF 65%. 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)  (HCC)     Comment:  a.) TTE 09/29/2020: EF 45-50%, sev bas-mid inferior HK,               GLS -15.5%,  mild MR, G1DD; b.) TTE 08/06/2021: EF 45-50%,              no RWMAs, GLS -16.5%, mild MR, G2DD 01/2020: History of 2019 novel coronavirus disease (COVID-19) 1976: Hypertension     Comment:  only on meds for a year 12/09/2013: Implantable loop recorder     Comment:  a.) Medtronic ILR device No date: Long term current use of aspirin No date: Mixed hyperlipidemia No date: Multiple lipomas No date: NICM (nonischemic cardiomyopathy) (HCC)     Comment:  a.) TTE 10/08/2014: EF 50-55%; b.) TTE 09/29/2020: EF               45-50%; c.) TTE 08/06/2021: EF 45-50% 05/2014: Nonsustained ventricular tachycardia (HCC)     Comment:  a.) noted on ILR in 05/2014; b.)  holter 10/2021: NSVT x               2 episodes; longest lasted 6 beats at a rate of 197 bpm No date: Osteoporosis     Comment:  a.) history of associated rib fracture; b.) on oral               bisphosphonate therapy No date: Palpitations (PVCs)     Comment:  a.) Zio 10/25/2021: occasional SVE (15,761, 1.0%), SVE               couplets (349, <1.0%), and SVE triplets (6. <1.0%) 01/2020: Pneumonia due to COVID-19 virus     Comment:  a.) hospitalized and Tx'd with MAB infusions 10/2021: PSVT (paroxysmal supraventricular tachycardia) (HCC)     Comment:  a.) holter 10/2021: 148 SVT episodes with max HR 226 bpm No date: Seasonal allergies No date: Sinus tachycardia No date: Stricture and stenosis of esophagus 10/16/2013: Syncope  Past Surgical History: 11/24/2020: COLONOSCOPY WITH PROPOFOL; N/A     Comment:  Procedure: COLONOSCOPY WITH PROPOFOL;  Surgeon: [name], MD;  Location: [facility] ENDOSCOPY;  Service:               Endoscopy;  Laterality: N/A; 01/18/2023: COLONOSCOPY WITH PROPOFOL; N/A     Comment:  Procedure: COLONOSCOPY WITH PROPOFOL;  Surgeon: [name], MD;  Location: [facility] ENDOSCOPY;  Service:               Endoscopy;  Laterality: N/A; 11/29/2022: ESOPHAGEAL DILATION     Comment:  Procedure: ESOPHAGEAL DILATION;  Surgeon: [name],               MD;  Location: [facility] ENDOSCOPY;  Service: Endoscopy;; 11/29/2022: ESOPHAGOGASTRODUODENOSCOPY (EGD) WITH PROPOFOL; N/A     Comment:  Procedure: ESOPHAGOGASTRODUODENOSCOPY (EGD) WITH               PROPOFOL;  Surgeon: [name], MD;  Location: [facility]               ENDOSCOPY;  Service: Endoscopy;  Laterality: N/A; 10/17/2013: LEFT HEART CATHETERIZATION WITH CORONARY ANGIOGRAM; N/A     Comment:  Procedure: LEFT HEART CATHETERIZATION WITH CORONARY               ANGIOGRAM;  Surgeon: Kathleene Hazel, MD;                Location: [facility] CATH LAB;  Service: Cardiovascular;                Laterality:  N/A; 12/09/2013: LOOP RECORDER IMPLANT; N/A     Comment:  Procedure: LOOP RECORDER IMPLANT;  Surgeon: Duke Salvia, MD;  Location: [facility] CATH LAB;  Service:               Cardiovascular;  Laterality: N/A; 01/10/2022: TRANSURETHRAL RESECTION OF PROSTATE; N/A     Comment:  Procedure: TRANSURETHRAL RESECTION OF THE PROSTATE               (TURP);  Surgeon: Vanna Scotland, MD;  Location: [facility]               ORS;  Service: Urology;  Laterality: N/A;  BMI    Body Mass Index: 22.52 kg/m      Reproductive/Obstetrics negative OB ROS                              Anesthesia Physical Anesthesia Plan  ASA: 3  Anesthesia Plan: General ETT   Post-op Pain Management: Ofirmev IV (intra-op)* and Toradol IV (intra-op)*   Induction: Intravenous  PONV Risk Score and Plan: 2 and Ondansetron, Dexamethasone, Midazolam and Treatment may vary due to age or medical condition  Airway Management Planned: Oral ETT  Additional Equipment:   Intra-op Plan:   Post-operative Plan: Extubation in OR  Informed Consent: I have reviewed the patients History and Physical, chart, labs and discussed the procedure including the risks, benefits and alternatives for the proposed anesthesia with the patient or authorized representative who has indicated his/her understanding and acceptance.     Dental Advisory Given  Plan Discussed with: Anesthesiologist, CRNA and Surgeon  Anesthesia Plan Comments: (Patient consented for risks of anesthesia including but not limited to:  - adverse reactions to medications - damage to eyes, teeth, lips or other oral mucosa - nerve damage due to positioning  - sore throat or hoarseness - Damage to heart, brain, nerves, lungs, other parts of body or loss of life  Patient voiced understanding and assent.)         Anesthesia Quick Evaluation

## 2023-03-23 ENCOUNTER — Encounter: Payer: Self-pay | Admitting: Surgery

## 2023-03-24 LAB — SURGICAL PATHOLOGY

## 2023-03-25 DIAGNOSIS — I110 Hypertensive heart disease with heart failure: Secondary | ICD-10-CM | POA: Diagnosis not present

## 2023-03-25 DIAGNOSIS — R9431 Abnormal electrocardiogram [ECG] [EKG]: Secondary | ICD-10-CM | POA: Diagnosis not present

## 2023-03-25 DIAGNOSIS — Z5941 Food insecurity: Secondary | ICD-10-CM | POA: Diagnosis not present

## 2023-03-25 DIAGNOSIS — I5022 Chronic systolic (congestive) heart failure: Secondary | ICD-10-CM | POA: Diagnosis not present

## 2023-03-25 DIAGNOSIS — S0990XA Unspecified injury of head, initial encounter: Secondary | ICD-10-CM | POA: Diagnosis not present

## 2023-03-25 DIAGNOSIS — Z7982 Long term (current) use of aspirin: Secondary | ICD-10-CM | POA: Diagnosis not present

## 2023-03-25 DIAGNOSIS — I2510 Atherosclerotic heart disease of native coronary artery without angina pectoris: Secondary | ICD-10-CM | POA: Diagnosis not present

## 2023-03-25 DIAGNOSIS — E041 Nontoxic single thyroid nodule: Secondary | ICD-10-CM | POA: Diagnosis not present

## 2023-03-25 DIAGNOSIS — S32038A Other fracture of third lumbar vertebra, initial encounter for closed fracture: Secondary | ICD-10-CM | POA: Diagnosis not present

## 2023-03-25 DIAGNOSIS — R55 Syncope and collapse: Secondary | ICD-10-CM | POA: Diagnosis not present

## 2023-03-25 DIAGNOSIS — S0003XA Contusion of scalp, initial encounter: Secondary | ICD-10-CM | POA: Diagnosis not present

## 2023-03-25 DIAGNOSIS — Z23 Encounter for immunization: Secondary | ICD-10-CM | POA: Diagnosis not present

## 2023-03-25 DIAGNOSIS — S32009A Unspecified fracture of unspecified lumbar vertebra, initial encounter for closed fracture: Secondary | ICD-10-CM | POA: Diagnosis not present

## 2023-03-25 DIAGNOSIS — Z880 Allergy status to penicillin: Secondary | ICD-10-CM | POA: Diagnosis not present

## 2023-03-25 DIAGNOSIS — I428 Other cardiomyopathies: Secondary | ICD-10-CM | POA: Diagnosis not present

## 2023-03-25 DIAGNOSIS — S29009A Unspecified injury of muscle and tendon of unspecified wall of thorax, initial encounter: Secondary | ICD-10-CM | POA: Diagnosis not present

## 2023-03-25 DIAGNOSIS — I4891 Unspecified atrial fibrillation: Secondary | ICD-10-CM | POA: Diagnosis not present

## 2023-03-25 DIAGNOSIS — E785 Hyperlipidemia, unspecified: Secondary | ICD-10-CM | POA: Diagnosis not present

## 2023-03-25 DIAGNOSIS — S0101XA Laceration without foreign body of scalp, initial encounter: Secondary | ICD-10-CM | POA: Diagnosis not present

## 2023-03-27 ENCOUNTER — Telehealth: Payer: Self-pay | Admitting: Internal Medicine

## 2023-03-27 ENCOUNTER — Encounter: Payer: Self-pay | Admitting: Surgery
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
## 2023-03-28 NOTE — Telephone Encounter (Signed)
Spoke with pt who reports HR WNL.  Current rate 79.  Pt is scheduled for ED follow up on 04/13/2022.  Reviewed ED precautions.  Pt verbalizes understanding and agrees with current plan.

## 2023-04-01 DIAGNOSIS — I4891 Unspecified atrial fibrillation: Secondary | ICD-10-CM | POA: Diagnosis not present

## 2023-04-03 DIAGNOSIS — Z4802 Encounter for removal of sutures: Secondary | ICD-10-CM | POA: Diagnosis not present

## 2023-04-03 DIAGNOSIS — S32009A Unspecified fracture of unspecified lumbar vertebra, initial encounter for closed fracture: Secondary | ICD-10-CM | POA: Diagnosis not present

## 2023-04-10 ENCOUNTER — Encounter: Payer: Self-pay | Admitting: Surgery

## 2023-04-10 ENCOUNTER — Encounter: Payer: BC Managed Care – PPO | Admitting: Surgery

## 2023-04-10 ENCOUNTER — Ambulatory Visit (INDEPENDENT_AMBULATORY_CARE_PROVIDER_SITE_OTHER): Payer: BC Managed Care – PPO | Admitting: Surgery

## 2023-04-10 VITALS — BP 120/73 | HR 68 | Temp 97.9°F | Ht 75.0 in | Wt 166.8 lb

## 2023-04-10 DIAGNOSIS — D1721 Benign lipomatous neoplasm of skin and subcutaneous tissue of right arm: Secondary | ICD-10-CM

## 2023-04-10 DIAGNOSIS — Z09 Encounter for follow-up examination after completed treatment for conditions other than malignant neoplasm: Secondary | ICD-10-CM

## 2023-04-10 DIAGNOSIS — D1723 Benign lipomatous neoplasm of skin and subcutaneous tissue of right leg: Secondary | ICD-10-CM

## 2023-04-10 NOTE — Patient Instructions (Signed)
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
## 2023-04-11 ENCOUNTER — Other Ambulatory Visit: Payer: Self-pay | Admitting: Physician Assistant
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
## 2023-04-14 ENCOUNTER — Ambulatory Visit: Payer: BC Managed Care – PPO | Attending: Cardiology

## 2023-04-14 ENCOUNTER — Encounter: Payer: Self-pay | Admitting: *Deleted

## 2023-04-14 ENCOUNTER — Ambulatory Visit: Payer: BC Managed Care – PPO | Attending: Cardiology | Admitting: Cardiology

## 2023-04-14 ENCOUNTER — Encounter: Payer: Self-pay | Admitting: Cardiology

## 2023-04-14 VITALS — BP 106/70 | HR 61 | Ht 75.0 in | Wt 170.1 lb

## 2023-04-14 DIAGNOSIS — I5022 Chronic systolic (congestive) heart failure: Secondary | ICD-10-CM

## 2023-04-14 DIAGNOSIS — I4891 Unspecified atrial fibrillation: Secondary | ICD-10-CM

## 2023-04-14 DIAGNOSIS — I471 Supraventricular tachycardia, unspecified: Secondary | ICD-10-CM | POA: Diagnosis not present

## 2023-04-14 LAB — ECHOCARDIOGRAM COMPLETE
AV Mean grad: 4 mm[Hg]
AV Peak grad: 6.8 mm[Hg]
Ao pk vel: 1.3 m/s
Area-P 1/2: 2.83 cm2
Calc EF: 39.8 %
Height: 75 in
S' Lateral: 4.7 cm
Single Plane A2C EF: 40 %
Single Plane A4C EF: 39.4 %
Weight: 2722 [oz_av]

## 2023-04-14 MED ORDER — APIXABAN 5 MG PO TABS: 5.0000 mg | ORAL_TABLET | Freq: Two times a day (BID) | ORAL | Status: AC

## 2023-04-14 MED ORDER — APIXABAN 5 MG PO TABS: 5.0000 mg | ORAL_TABLET | Freq: Two times a day (BID) | ORAL | 3 refills | Status: DC

## 2023-04-14 NOTE — Patient Instructions (Addendum)
 Medication Instructions:  Your physician recommends the following medication changes.  STOP TAKING Aspirin  81 mg daily  START TAKING: Eliquis  5 mg by mouth twice a day   *If you need a refill on your cardiac medications before your next appointment, please call your pharmacy*   Lab Work: Your provider would like for you to have following labs drawn today (CBC, CMP, TSH, Mg).     Testing/Procedures: Your physician has requested that you have an echocardiogram. Echocardiography is a painless test that uses sound waves to create images of your heart. It provides your doctor with information about the size and shape of your heart and how well your heart's chambers and valves are working.   You may receive an ultrasound enhancing agent through an IV if needed to better visualize your heart during the echo. This procedure takes approximately one hour.  There are no restrictions for this procedure.  This will take place at [address] [facility]) [address], [place] [address]  Please note: We ask at that you not bring children with you during ultrasound (echo/ vascular) testing. Due to room size and safety concerns, children are not allowed in the ultrasound rooms during exams. Our front office staff cannot provide observation of children in our lobby area while testing is being conducted. An adult accompanying a patient to their appointment will only be allowed in the ultrasound room at the discretion of the ultrasound technician under special circumstances. We apologize for any inconvenience.    Follow-Up: At [facility], you and your health needs are our priority.  As part of our continuing mission to provide you with exceptional heart care, we have created designated Provider Care Teams.  These Care Teams include your primary Cardiologist (physician) and Advanced Practice Providers (APPs -  Physician Assistants and Nurse Practitioners) who all work together to  provide you with the care you need, when you need it.  We recommend signing up for the patient portal called MyChart.  Sign up information is provided on this After Visit Summary.  MyChart is used to connect with patients for Virtual Visits (Telemedicine).  Patients are able to view lab/test results, encounter notes, upcoming appointments, etc.  Non-urgent messages can be sent to your provider as well.   To learn more about what you can do with MyChart, go to [email or web address].    Your next appointment:   2-3 week(s)  Provider:   Elspeth Sage, MD or Suzann Riddle, NP

## 2023-04-15 LAB — CBC
Hematocrit: 48.1 % (ref 37.5–51.0)
Hemoglobin: 16.1 g/dL (ref 13.0–17.7)
MCH: 31 pg (ref 26.6–33.0)
MCHC: 33.5 g/dL (ref 31.5–35.7)
MCV: 93 fL (ref 79–97)
Platelets: 357 10*3/uL (ref 150–450)
RBC: 5.2 x10E6/uL (ref 4.14–5.80)
RDW: 11.4 % — ABNORMAL LOW (ref 11.6–15.4)
WBC: 7.8 10*3/uL (ref 3.4–10.8)

## 2023-04-15 LAB — COMPREHENSIVE METABOLIC PANEL
ALT: 8 [IU]/L (ref 0–44)
AST: 13 [IU]/L (ref 0–40)
Albumin: 4.1 g/dL (ref 3.9–4.9)
Alkaline Phosphatase: 165 [IU]/L — ABNORMAL HIGH (ref 44–121)
BUN/Creatinine Ratio: 25 — ABNORMAL HIGH (ref 10–24)
BUN: 19 mg/dL (ref 8–27)
Bilirubin Total: 0.6 mg/dL (ref 0.0–1.2)
CO2: 24 mmol/L (ref 20–29)
Calcium: 9.2 mg/dL (ref 8.6–10.2)
Chloride: 102 mmol/L (ref 96–106)
Creatinine, Ser: 0.75 mg/dL — ABNORMAL LOW (ref 0.76–1.27)
Globulin, Total: 2.8 g/dL (ref 1.5–4.5)
Glucose: 94 mg/dL (ref 70–99)
Potassium: 4.7 mmol/L (ref 3.5–5.2)
Sodium: 138 mmol/L (ref 134–144)
Total Protein: 6.9 g/dL (ref 6.0–8.5)
eGFR: 101 mL/min/{1.73_m2} (ref >59)

## 2023-04-15 LAB — MAGNESIUM: Magnesium: 2.3 mg/dL (ref 1.6–2.3)

## 2023-04-15 LAB — TSH: TSH: 0.891 u[IU]/mL (ref 0.450–4.500)

## 2023-04-15 MED ORDER — ALENDRONATE SODIUM 70 MG PO TABS: 70.0000 mg | ORAL_TABLET | ORAL | 3 refills | Status: DC

## 2023-04-15 NOTE — Telephone Encounter (Signed)
 Requested Prescriptions  Pending Prescriptions Disp Refills   alendronate  (FOSAMAX ) 70 MG tablet [Pharmacy Med Name: ALENDRONATE  SODIUM 70 MG TAB] 12 tablet 3    Sig: TAKE ONE TABLET BY MOUTH EACH WEEK, ON AN EMPTY STOMACH BEFORE BREAKFAST WITH 8oz OF WATER AND REMAIN UPRIGHT FOR :30     Endocrinology:  Bisphosphonates Failed - 04/15/2023 12:04 PM      Failed - Vitamin D  in normal range and within 360 days    No results found for: CI7874NY7, CI6874NY7, CI874NY7UNU, 25OHVITD3, 25OHVITD2, 25OHVITD1, VD25OH       Failed - Cr in normal range and within 360 days    Creatinine  Date Value Ref Range Status  10/16/2013 0.81 0.60 - 1.30 mg/dL Final   Creatinine, Ser  Date Value Ref Range Status  04/14/2023 0.75 (L) 0.76 - 1.27 mg/dL Final         Failed - Phosphate in normal range and within 360 days    No results found for: PHOS       Passed - Ca in normal range and within 360 days    Calcium   Date Value Ref Range Status  04/14/2023 9.2 8.6 - 10.2 mg/dL Final   Calcium , Total  Date Value Ref Range Status  10/16/2013 8.9 8.5 - 10.1 mg/dL Final         Passed - Mg Level in normal range and within 360 days    Magnesium  Date Value Ref Range Status  04/14/2023 2.3 1.6 - 2.3 mg/dL Final  92/91/7984 2.0 mg/dL Final    Comment:    8.1-7.5 THERAPEUTIC RANGE: 4-7 mg/dL TOXIC: > 10 mg/dL  -----------------------          Passed - eGFR is 30 or above and within 360 days    EGFR (African American)  Date Value Ref Range Status  10/16/2013 >60  Final   GFR calc Af Amer  Date Value Ref Range Status  10/17/2013 >90 >90 mL/min Final    Comment:    (NOTE) The eGFR has been calculated using the CKD EPI equation. This calculation has not been validated in all clinical situations. eGFR's persistently <90 mL/min signify possible Chronic Kidney Disease.   EGFR (Non-African Amer.)  Date Value Ref Range Status  10/16/2013 >60  Final    Comment:    eGFR values  <58mL/min/1.73 m2 may be an indication of chronic kidney disease (CKD). Calculated eGFR is useful in patients with stable renal function. The eGFR calculation will not be reliable in acutely ill patients when serum creatinine is changing rapidly. It is not useful in  patients on dialysis. The eGFR calculation may not be applicable to patients at the low and high extremes of body sizes, pregnant women, and vegetarians.    GFR, Estimated  Date Value Ref Range Status  01/05/2022 >60 >60 mL/min Final    Comment:    (NOTE) Calculated using the CKD-EPI Creatinine Equation (2021)    GFR  Date Value Ref Range Status  12/03/2013 120.46 >60.00 mL/min Final   eGFR  Date Value Ref Range Status  04/14/2023 101 >59 mL/min/1.73 Final         Passed - Valid encounter within last 12 months    Recent Outpatient Visits           2 months ago Annual physical exam   [place] [facility] Melvin Pao, NP   3 months ago Dysphagia, unspecified type   [place] [facility] Mecum, Rocky BRAVO,  PA-C   1 year ago Rib pain   [place] [facility] Melvin Pao, NP   1 year ago Encounter for annual physical exam   [place] [facility] Mecum, Rocky BRAVO, PA-C   1 year ago Posterior pain of left hip   [place] [facility] Mecum, Rocky BRAVO, PA-C       Future Appointments             In 1 week Melvin Pao, NP [facility]   In 3 weeks Riddle, Suzann, NP [facility] [place]   In 3 months Melvin Pao, NP [facility]            Passed - Bone Mineral Density or Dexa Scan completed in the last 2 years

## 2023-04-15 NOTE — Addendum Note (Signed)
 Addended by: Wilford Corner on: 04/15/2023 12:06 PM   Modules accepted: Orders

## 2023-04-17 ENCOUNTER — Telehealth: Payer: Self-pay | Admitting: Cardiology

## 2023-04-17 DIAGNOSIS — I2510 Atherosclerotic heart disease of native coronary artery without angina pectoris: Secondary | ICD-10-CM

## 2023-04-17 DIAGNOSIS — R55 Syncope and collapse: Secondary | ICD-10-CM

## 2023-04-17 NOTE — Progress Notes (Signed)
 Echo is overall similar from prior LVEF remains 45-50%.   I called patient to review TTE results.

## 2023-04-17 NOTE — Telephone Encounter (Signed)
 Message sent to cardiac MRI scheduler to schedule date and time. Message also forward to [place] schedulers to schedule follow up appointment   Riddle, Suzann, NP  Cv Div [place] Triage4 hours ago (9:27 AM)    Please call patient to facilitate scheduling cardiac MRI. Follow-up appt with me or Dr. Fernande may need to be re-scheduled after MRI, depending on when it is scheduled.   Thanks! Suzann

## 2023-04-17 NOTE — Telephone Encounter (Signed)
 I called patient to review TTE results.  These were reviewed with Dr. Graciela Husbands.   Recommend a cardiac MRI to further eval for scar to explain patient's syncope

## 2023-04-25 ENCOUNTER — Ambulatory Visit: Payer: BC Managed Care – PPO | Admitting: Nurse Practitioner

## 2023-04-25 ENCOUNTER — Encounter: Payer: Self-pay | Admitting: Nurse Practitioner

## 2023-04-25 VITALS — BP 121/76 | HR 72 | Temp 97.6°F | Ht 75.0 in | Wt 174.2 lb

## 2023-04-25 DIAGNOSIS — S32038D Other fracture of third lumbar vertebra, subsequent encounter for fracture with routine healing: Secondary | ICD-10-CM

## 2023-04-25 DIAGNOSIS — R748 Abnormal levels of other serum enzymes: Secondary | ICD-10-CM

## 2023-04-25 MED ORDER — CYCLOBENZAPRINE HCL 10 MG PO TABS: 10.0000 mg | ORAL_TABLET | Freq: Three times a day (TID) | ORAL | 0 refills | Status: DC | PRN

## 2023-04-25 MED ORDER — MELOXICAM 15 MG PO TABS: 15.0000 mg | ORAL_TABLET | Freq: Every day | ORAL | 0 refills | Status: DC
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
## 2023-04-26 LAB — COMPREHENSIVE METABOLIC PANEL
ALT: 9 [IU]/L (ref 0–44)
AST: 15 [IU]/L (ref 0–40)
Albumin: 4 g/dL (ref 3.9–4.9)
Alkaline Phosphatase: 115 [IU]/L (ref 44–121)
BUN/Creatinine Ratio: 28 — ABNORMAL HIGH (ref 10–24)
BUN: 23 mg/dL (ref 8–27)
Bilirubin Total: 0.5 mg/dL (ref 0.0–1.2)
CO2: 23 mmol/L (ref 20–29)
Calcium: 9.7 mg/dL (ref 8.6–10.2)
Chloride: 104 mmol/L (ref 96–106)
Creatinine, Ser: 0.81 mg/dL (ref 0.76–1.27)
Globulin, Total: 2.6 g/dL (ref 1.5–4.5)
Glucose: 80 mg/dL (ref 70–99)
Potassium: 4.3 mmol/L (ref 3.5–5.2)
Sodium: 140 mmol/L (ref 134–144)
Total Protein: 6.6 g/dL (ref 6.0–8.5)
eGFR: 98 mL/min/{1.73_m2} (ref >59)

## 2023-04-26 LAB — VITAMIN D 25 HYDROXY (VIT D DEFICIENCY, FRACTURES): Vit D, 25-Hydroxy: 31.9 ng/mL (ref 30.0–100.0)

## 2023-05-11 ENCOUNTER — Ambulatory Visit: Payer: BC Managed Care – PPO | Admitting: Cardiology

## 2023-05-17 ENCOUNTER — Encounter: Payer: BC Managed Care – PPO | Admitting: *Deleted

## 2023-05-17 DIAGNOSIS — Z006 Encounter for examination for normal comparison and control in clinical research program: Secondary | ICD-10-CM

## 2023-05-17 MED ORDER — STUDY - VICTORION-1 PREVENT - INCLISIRAN 300 MG/1.5 ML OR PLACEBO SQ INJECTION (PI-STUCKEY)
300.0000 mg | INJECTION | SUBCUTANEOUS | Status: AC
  Administered 2023-05-17: 300 mg via SUBCUTANEOUS
  Filled 2023-05-17: qty 1.5

## 2023-05-17 NOTE — Research (Signed)
  V1P visit 3   DATE: 17-May-2023   SUBJECT ID: [id number]     Visit: 3        Day: 90           Prior or concomitant medications reviewed [x]    Lipid lowering medications reviewed [x]    Surgical history reviewed [x]    TIME: BP: PULSE:  0803 105/69 56  0808 107/76 59  0811 104/72 58  Mean BP: 104/72                                    Mean Pulse: 59   Labs collected at: 0953 Fasting Lipid Profile [x]  Fasting Lp(a) []  Liver function tests []  Exploratory biomarkers []    AE/SAE assessment completed [x]    Study drug administered [x]    Endpoint assessment completed [x]    [name] is here for V3 of V1P research. He reports that he has had 2 ed visits and one hospital admission , no changes in his meds. Wt was 170 lbs. Scheduled next visit for July 25 at 0800. Injection was given in left lower abd at 08:42 Kit number [id number] tol well.

## 2023-06-06 ENCOUNTER — Encounter (HOSPITAL_COMMUNITY): Payer: Self-pay

## 2023-06-07 ENCOUNTER — Other Ambulatory Visit: Payer: Self-pay | Admitting: Cardiology

## 2023-06-07 ENCOUNTER — Ambulatory Visit
Admission: RE | Admit: 2023-06-07 | Discharge: 2023-06-07 | Disposition: A | Discharge status: Home / Self Care | Payer: BC Managed Care – PPO | Source: Ambulatory Visit | Attending: Cardiology | Admitting: Cardiology

## 2023-06-07 ENCOUNTER — Ambulatory Visit: Payer: Self-pay | Admitting: Nurse Practitioner

## 2023-06-07 DIAGNOSIS — R55 Syncope and collapse: Secondary | ICD-10-CM | POA: Diagnosis not present

## 2023-06-07 DIAGNOSIS — I2510 Atherosclerotic heart disease of native coronary artery without angina pectoris: Secondary | ICD-10-CM | POA: Diagnosis not present

## 2023-06-07 MED ORDER — GADOBUTROL 1 MMOL/ML IV SOLN
10.0000 mL | Freq: Once | INTRAVENOUS | Status: AC | PRN
  Administered 2023-06-07: 10 mL via INTRAVENOUS

## 2023-06-08 NOTE — Progress Notes (Unsigned)
 Electophysiology Clinic Note    Date:  06/09/2023  Patient ID:  [name], [name] [birth year], MRN [id number] PCP:  Larae Grooms, NP  Cardiologist:  [name], MD Electrophysiologist: Sherryl Manges, MD     Patient Profile    Chief Complaint: syncope, new afib  History of Present Illness: [name] is a 63 y.o. male with PMH notable for syncope, atypical chest pain, NICM, pSVT, HFmrEF, HTN, unexplained weight loss; seen today for Sherryl Manges, MD for routine follow up.    He has an abandoned ILR, initially implanted to further eval syncope.  I recently saw him 04/2023 after an ER visit for syncope. He had prodrome of nausea > chest pain > sweating and then had syncope. In ER, EKG showed AFib w rvr up to 150s. No EMS run sheet. Updated echo with stable midrange LVEF at 45-50%. In discussion with Dr. Graciela Husbands, recommended cMRI, which was overall benign.   On follow-up today, he had another significant presyncope event recently while at work. Had same prodrome of nausea, sweating and chest pain. No LOC at this time. He was sitting at desk when it happened.  His wife states that patient continues to have dizzy episodes. Patient is not able to correlate if these episodes happen with position changes or when he has missed a meal  He continues to have unintentional weight loss. Wife says he is eating a little more than he was, but does not have much of an appetite.  He is tolerating eliquis well, no bleeding concerns. Has manufacturers coupon and it costs him $10/month.       Device Information: MDT ILR, RRT 01/2017  AAD History: None     ROS:  Please see the history of present illness. All other systems are reviewed and otherwise negative.    Physical Exam    VS:  BP 112/69 (BP Location: Left Arm, Patient Position: Sitting, Cuff Size: Normal)   Pulse 64   Ht 6\' 3"  (1.905 m)   Wt 168 lb 3.2 oz (76.3 kg)   SpO2 98%   BMI 21.02 kg/m  BMI: Body mass index is 21.02  kg/m.  Orthostatic VS for the past 24 hrs (Last 3 readings):  BP- Lying Pulse- Lying BP- Sitting Pulse- Sitting BP- Standing at 0 minutes Pulse- Standing at 0 minutes BP- Standing at 3 minutes Pulse- Standing at 3 minutes  06/09/23 1054 109/73 63 117/74 59 107/68 65 116/78 66     Wt Readings from Last 3 Encounters:  06/09/23 168 lb 3.2 oz (76.3 kg)  05/17/23 170 lb (77.1 kg)  04/25/23 174 lb 3.2 oz (79 kg)     GEN- The patient is well appearing, alert and oriented x 3 today.   Lungs- Clear to ausculation bilaterally, normal work of breathing.  Heart- Regular rate and rhythm, no murmurs, rubs or gallops Extremities- No peripheral edema, warm, dry Skin-  ILR site well-healed, no tethering    Studies Reviewed   Previous EP, cardiology notes.    EKG is ordered. Personal review of EKG from today shows:    EKG Interpretation Date/Time:  Friday June 09 2023 10:33:13 EST Ventricular Rate:  67 PR Interval:  114 QRS Duration:  94 QT Interval:  386 QTC Calculation: 407 R Axis:   -37  Text Interpretation: Normal sinus rhythm Left axis deviation LOW VOLTAGE Confirmed by Sherie Don [phone]) on 06/09/2023 10:36:27 AM      Cardiac MRI, 06/07/2023 1.  Normal LV size  and systolic function.  LVEF 55%.  2.  No LGE or scar  3.  No evidence for infiltrative or inflammatory disease  4.  Moderately dilated RV size, normal RV function.  TTE, 04/14/2023  1. Left ventricular ejection fraction, by estimation, is 45 to 50%. The left ventricle has mildly decreased function. The left ventricle demonstrates global hypokinesis. Left ventricular diastolic parameters were normal. The average left ventricular global longitudinal strain is -20.5 %. The global longitudinal strain is normal.   2. Right ventricular systolic function is normal. The right ventricular size is normal.   3. The mitral valve is normal in structure. Trivial mitral valve regurgitation.   4. The aortic valve is tricuspid. Aortic  valve regurgitation is not visualized.   5. The inferior vena cava is normal in size with greater than 50% respiratory variability, suggesting right atrial pressure of 3 mmHg.    Long term monitor, 10/25/2021 Patient had a min HR of 50 bpm, max HR of 226 bpm, and avg HR of 76 bpm. Predominant underlying rhythm was Sinus Rhythm.    2 Ventricular Tachycardia runs occurred, the run with the fastest interval lasting 6 beats with a max rate of 197 bpm (avg 176 bpm);  the run with the fastest interval was also the longest.    148 Supraventricular Tachycardia runs occurred, the run with the fastest interval lasting 8.0 secs with a max rate of 226 bpm, the longest lasting 55.8 secs with an avg rate of 153 bpm. Isolated  SVEs were rare (<1.0%), SVE Couplets were rare (<1.0%), and SVE Triplets were rare (<1.0%). Isolated VEs were occasional (1.0%, 15761), VE Couplets were rare (<1.0%, 349), and VE Triplets were rare (<1.0%, 6). Ventricular Bigeminy and Trigeminy were  present.   Triggered events assoc with sinus  TTE, 08/06/2021  1. Left ventricular ejection fraction, by estimation, is 45 to 50%. The left ventricle has mildly decreased function. The left ventricle has no regional wall motion abnormalities. Left ventricular diastolic parameters are consistent with Grade II diastolic dysfunction (pseudonormalization). The average left ventricular global longitudinal strain is -16.5 %.  2. Right ventricular systolic function is normal. The right ventricular size is normal. Tricuspid regurgitation signal is inadequate for assessing PA pressure.  3. The mitral valve is normal in structure. Mild mitral valve regurgitation. No evidence of mitral stenosis.  4. The aortic valve is tricuspid. Aortic valve regurgitation is not visualized. No aortic stenosis is present.  5. There is borderline dilatation of the aortic root and of the ascending aorta, measuring 37 mm.  6. The inferior vena cava is normal in size with  greater than 50% respiratory variability, suggesting right atrial pressure of 3 mmHg.   Assessment and Plan    #) syncope, presyncope, dizziness #) pSVT #) AFib Syncope with LOC event in 03/2023, had recurrent episode a few weeks ago with similar prodrome but no LOC. Stable TTE and cardiac MRI benign Orthostatics negative today We'll update 2 week zio to further eval AFib burden with ongoing dizziness We also briefly discussed reimplantation of ILR given rarity of presyncope events, but we'll start with updating zio first.  I advised it is ok to resume driving given he has stable prodrome.   #) Hypercoag d/t afib CHA2DS2-VASc Score = at least 2 [CHF History: 1, HTN History: 0, Diabetes History: 0, Stroke History: 0, Vascular Disease History: 1, Age Score: 0, Gender Score: 0].  Therefore, the patient's annual risk of stroke is 2.2 %.    Stroke  ppx - 5mg  eliquis BID, appropriately dosed No bleeding concerns  #) HFmrEF Appears euvolemic on exam GDMT - entresto 24-26, spiro 12.5 , lopressor 25mg  BID, jardiance   #) unexplained weight loss Recommended he reconnect with PCP and/or GI regarding ongoing unexplained weight loss and dizziness      Current medicines are reviewed at length with the patient today.   The patient has concerns regarding his medicines.  The following changes were made today:   none  Labs/ tests ordered today include:  Orders Placed This Encounter  Procedures   LONG TERM MONITOR (3-14 DAYS)   EKG 12-Lead     Disposition: Follow up with Dr. Graciela Husbands or EP APP  4-5 months , sooner if zio monitor is concerning   Signed, Sherie Don, NP  06/09/23  12:43 PM  Electrophysiology [facility]

## 2023-06-09 ENCOUNTER — Ambulatory Visit: Payer: BC Managed Care – PPO

## 2023-06-09 ENCOUNTER — Ambulatory Visit: Payer: BC Managed Care – PPO | Attending: Cardiology | Admitting: Cardiology

## 2023-06-09 VITALS — BP 112/69 | HR 64 | Ht 75.0 in | Wt 168.2 lb

## 2023-06-09 DIAGNOSIS — R42 Dizziness and giddiness: Secondary | ICD-10-CM

## 2023-06-09 DIAGNOSIS — R55 Syncope and collapse: Secondary | ICD-10-CM

## 2023-06-09 DIAGNOSIS — I471 Supraventricular tachycardia, unspecified: Secondary | ICD-10-CM

## 2023-06-09 DIAGNOSIS — I5022 Chronic systolic (congestive) heart failure: Secondary | ICD-10-CM | POA: Diagnosis not present

## 2023-06-09 DIAGNOSIS — I4891 Unspecified atrial fibrillation: Secondary | ICD-10-CM | POA: Diagnosis not present

## 2023-06-09 NOTE — Patient Instructions (Signed)
 Medication Instructions:  The current medical regimen is effective;  continue present plan and medications.  *If you need a refill on your cardiac medications before your next appointment, please call your pharmacy*   Testing/Procedures:  ZIO XT- Long Term Monitor Instructions  Your physician has requested you wear a ZIO patch monitor for 14 days.  This is a single patch monitor. [employer] supplies one patch monitor per enrollment. Additional stickers are not available. Please do not apply patch if you will be having a Nuclear Stress Test,  Echocardiogram, Cardiac CT, MRI, or Chest Xray during the period you would be wearing the  monitor. The patch cannot be worn during these tests. You cannot remove and re-apply the  ZIO XT patch monitor.  Your ZIO patch monitor will be mailed 3 day USPS to your address on file. It may take 3-5 days  to receive your monitor after you have been enrolled.  Once you have received your monitor, please review the enclosed instructions. Your monitor  has already been registered assigning a specific monitor serial # to you.  Billing and Patient Assistance Program Information  We have supplied [employer] with any of your insurance information on file for billing purposes. [employer] offers a sliding scale Patient Assistance Program for patients that do not have  insurance, or whose insurance does not completely cover the cost of the ZIO monitor.  You must apply for the Patient Assistance Program to qualify for this discounted rate.  To apply, please call [employer] at [phone], select option 4, select option 2, ask to apply for  Patient Assistance Program. Meredeth Ide will ask your household income, and how many people  are in your household. They will quote your out-of-pocket cost based on that information.  [employer] will also be able to set up a 5-month, interest-free payment plan if needed.  Applying the monitor   Shave hair from upper left chest.  Hold abrader  disc by orange tab. Rub abrader in 40 strokes over the upper left chest as  indicated in your monitor instructions.  Clean area with 4 enclosed alcohol pads. Let dry.  Apply patch as indicated in monitor instructions. Patch will be placed under collarbone on left  side of chest with arrow pointing upward.  Rub patch adhesive wings for 2 minutes. Remove white label marked "1". Remove the white  label marked "2". Rub patch adhesive wings for 2 additional minutes.  While looking in a mirror, press and release button in center of patch. A small green light will  flash 3-4 times. This will be your only indicator that the monitor has been turned on.  Do not shower for the first 24 hours. You may shower after the first 24 hours.  Press the button if you feel a symptom. You will hear a small click. Record Date, Time and  Symptom in the Patient Logbook.  When you are ready to remove the patch, follow instructions on the last 2 pages of Patient  Logbook. Stick patch monitor onto the last page of Patient Logbook.  Place Patient Logbook in the blue and white box. Use locking tab on box and tape box closed  securely. The blue and white box has prepaid postage on it. Please place it in the mailbox as  soon as possible. Your physician should have your test results approximately 7 days after the  monitor has been mailed back to [facility].  Call [facility] Customer Care at [phone] if you have questions regarding  your ZIO  XT patch monitor. Call them immediately if you see an orange light blinking on your  monitor.  If your monitor falls off in less than 4 days, contact our Monitor department at [phone].  If your monitor becomes loose or falls off after 4 days call [employer] at [phone] for  suggestions on securing your monitor    Follow-Up: At [facility], you and your health needs are our priority.  As part of our continuing mission to provide you with exceptional heart  care, we have created designated Provider Care Teams.  These Care Teams include your primary Cardiologist (physician) and Advanced Practice Providers (APPs -  Physician Assistants and Nurse Practitioners) who all work together to provide you with the care you need, when you need it.  We recommend signing up for the patient portal called "MyChart".  Sign up information is provided on this After Visit Summary.  MyChart is used to connect with patients for Virtual Visits (Telemedicine).  Patients are able to view lab/test results, encounter notes, upcoming appointments, etc.  Non-urgent messages can be sent to your provider as well.   To learn more about what you can do with MyChart, go to [email or web address].    Your next appointment:   4-5 month(s)  Provider:   Sherryl Manges, MD or Sherie Don, NP    Other Instructions Please see Primary care provider in regards to the dizziness.

## 2023-06-13 DIAGNOSIS — R42 Dizziness and giddiness: Secondary | ICD-10-CM

## 2023-06-30 DIAGNOSIS — R42 Dizziness and giddiness: Secondary | ICD-10-CM | POA: Diagnosis not present

## 2023-08-07 ENCOUNTER — Encounter: Payer: Self-pay | Admitting: Nurse Practitioner

## 2023-08-07 ENCOUNTER — Ambulatory Visit: Payer: Self-pay | Admitting: Nurse Practitioner

## 2023-08-07 VITALS — BP 114/71 | HR 60 | Temp 97.4°F | Wt 168.6 lb

## 2023-08-07 DIAGNOSIS — E782 Mixed hyperlipidemia: Secondary | ICD-10-CM

## 2023-08-07 DIAGNOSIS — I5022 Chronic systolic (congestive) heart failure: Secondary | ICD-10-CM | POA: Diagnosis not present

## 2023-08-07 DIAGNOSIS — I4891 Unspecified atrial fibrillation: Secondary | ICD-10-CM | POA: Insufficient documentation

## 2023-08-07 MED ORDER — CYCLOBENZAPRINE HCL 5 MG PO TABS: 5.0000 mg | ORAL_TABLET | Freq: Every day | ORAL | 1 refills | Status: DC

## 2023-08-07 NOTE — Progress Notes (Signed)
 BP 114/71   Pulse 60   Temp (!) 97.4 F (36.3 C) (Oral)   Wt 168 lb 9.6 oz (76.5 kg)   SpO2 98%   BMI 21.07 kg/m    Subjective:    Patient ID: [name], male    DOB: Apr 29, 1960, 63 y.o.   MRN: [id number]  HPI: [name] is a 63 y.o. male  Chief Complaint  Patient presents with   Diarrhea    Pt states he has had diarrhea for about a week or so and it just cleared up Thursday but is still feeling drained    HYPERLIPIDEMIA Hyperlipidemia status: excellent compliance Satisfied with current treatment?  yes Side effects:  no Medication compliance: excellent compliance Past cholesterol meds: atorvastain (lipitor) Supplements: none Aspirin :  yes The 10-year ASCVD risk score (Arnett DK, et al., 2019) is: 5.9%   Values used to calculate the score:     Age: 8 years     Sex: Male     Is Non-Hispanic African American: No     Diabetic: No     Tobacco smoker: No     Systolic Blood Pressure: 92 mmHg     Is BP treated: No     HDL Cholesterol: 44 mg/dL     Total Cholesterol: 148 mg/dL Chest pain:  no Coronary artery disease:  no Family history CAD:  no Family history early CAD:  no  HEART FAILURE Updated echo with stable midrange LVEF at 45-50%. Has had a problem with weight loss but his weight is stable in office today.   He recent had diarrhea for about a week.  States it finally went away this weekend.  He was using Pepto.  He is supposed to have another loop recorder put in next Monday.  He continues to struggle with fatigue.  He wakes up refreshed but then fades quickly by mid morning.    Continues to have upper back pain.  Ongoing symptoms for over a year.  Always there.    Relevant past medical, surgical, family and social history reviewed and updated as indicated. Interim medical history since our last visit reviewed. Allergies and medications reviewed and updated.  Review of Systems  Constitutional:  Positive for fatigue.  Eyes:  Negative for visual  disturbance.  Respiratory:  Negative for shortness of breath.   Cardiovascular:  Negative for chest pain and leg swelling.  Musculoskeletal:  Positive for back pain.  Neurological:  Negative for light-headedness and headaches.    Per HPI unless specifically indicated above     Objective:    BP 114/71   Pulse 60   Temp (!) 97.4 F (36.3 C) (Oral)   Wt 168 lb 9.6 oz (76.5 kg)   SpO2 98%   BMI 21.07 kg/m   Wt Readings from Last 3 Encounters:  08/07/23 168 lb 9.6 oz (76.5 kg)  06/09/23 168 lb 3.2 oz (76.3 kg)  05/17/23 170 lb (77.1 kg)    Physical Exam Vitals and nursing note reviewed.  Constitutional:      General: He is not in acute distress.    Appearance: Normal appearance. He is not ill-appearing, toxic-appearing or diaphoretic.  HENT:     Head: Normocephalic.     Right Ear: External ear normal.     Left Ear: External ear normal.     Nose: Nose normal. No congestion or rhinorrhea.     Mouth/Throat:     Mouth: Mucous membranes are moist.  Eyes:  General:        Right eye: No discharge.        Left eye: No discharge.     Extraocular Movements: Extraocular movements intact.     Conjunctiva/sclera: Conjunctivae normal.     Pupils: Pupils are equal, round, and reactive to light.  Cardiovascular:     Rate and Rhythm: Normal rate and regular rhythm.     Heart sounds: No murmur heard. Pulmonary:     Effort: Pulmonary effort is normal. No respiratory distress.     Breath sounds: Normal breath sounds. No wheezing, rhonchi or rales.  Abdominal:     General: Abdomen is flat. Bowel sounds are normal.  Musculoskeletal:       Arms:     Cervical back: Normal range of motion and neck supple.  Skin:    General: Skin is warm and dry.     Capillary Refill: Capillary refill takes less than 2 seconds.  Neurological:     General: No focal deficit present.     Mental Status: He is alert and oriented to person, place, and time.  Psychiatric:        Mood and Affect: Mood  normal.        Behavior: Behavior normal.        Thought Content: Thought content normal.        Judgment: Judgment normal.     Results for orders placed or performed in visit on 04/25/23  Comp Met (CMET)   Collection Time: 04/25/23  2:28 PM  Result Value Ref Range   Glucose 80 70 - 99 mg/dL   BUN 23 8 - 27 mg/dL   Creatinine, Ser 1.61 0.76 - 1.27 mg/dL   eGFR 98 >09 UE/AVW/0.98   BUN/Creatinine Ratio 28 (H) 10 - 24   Sodium 140 134 - 144 mmol/L   Potassium 4.3 3.5 - 5.2 mmol/L   Chloride 104 96 - 106 mmol/L   CO2 23 20 - 29 mmol/L   Calcium  9.7 8.6 - 10.2 mg/dL   Total Protein 6.6 6.0 - 8.5 g/dL   Albumin 4.0 3.9 - 4.9 g/dL   Globulin, Total 2.6 1.5 - 4.5 g/dL   Bilirubin Total 0.5 0.0 - 1.2 mg/dL   Alkaline Phosphatase 115 44 - 121 IU/L   AST 15 0 - 40 IU/L   ALT 9 0 - 44 IU/L  Vitamin D  (25 hydroxy)   Collection Time: 04/25/23  2:28 PM  Result Value Ref Range   Vit D, 25-Hydroxy 31.9 30.0 - 100.0 ng/mL      Assessment & Plan:   Problem List Items Addressed This Visit       Cardiovascular and Mediastinum   Chronic heart failure with mildly reduced ejection fraction (HFmrEF, 41-49%) (HCC) - Primary   Chronic.  Followed by Dr. Nolan Battle and Dr. Rodolfo Clan.  Updated echo with stable midrange LVEF at 45-50%. Currently on Jardiance  and Entresto .  Doing well with current medication regimen.  Continue to follow up with Specialists.  Follow up in 6 months.  Call sooner if concerns arise.   Weight is steady at 168lbs.      Relevant Orders   Comp Met (CMET)   Atrial fibrillation (HCC)   Chronic.  Followed by Dr. Rodolfo Clan.  Has another appointment next week for a loop recorder.         Other   Mixed hyperlipidemia   Chronic.  Controlled.  Continue with current medication regimen of Atorvastatin  daily.  Labs ordered today.  Return to clinic in 6 months for reevaluation.  Call sooner if concerns arise.       Relevant Orders   Lipid Profile     Follow up plan: Return in about 6  months (around 02/06/2024) for Physical and Fasting labs.

## 2023-08-07 NOTE — Assessment & Plan Note (Signed)
 Chronic.  Followed by Dr. Nolan Battle and Dr. Rodolfo Clan.  Updated echo with stable midrange LVEF at 45-50%. Currently on Jardiance  and Entresto .  Doing well with current medication regimen.  Continue to follow up with Specialists.  Follow up in 6 months.  Call sooner if concerns arise.   Weight is steady at 168lbs.

## 2023-08-07 NOTE — Assessment & Plan Note (Signed)
 Chronic.  Controlled.  Continue with current medication regimen of Atorvastatin daily.  Labs ordered today.  Return to clinic in 6 months for reevaluation.  Call sooner if concerns arise.

## 2023-08-07 NOTE — Assessment & Plan Note (Signed)
 Chronic.  Followed by Dr. Rodolfo Clan.  Has another appointment next week for a loop recorder.

## 2023-08-08 ENCOUNTER — Encounter: Payer: Self-pay | Admitting: Nurse Practitioner

## 2023-08-08 LAB — COMPREHENSIVE METABOLIC PANEL WITH GFR
ALT: 16 IU/L (ref 0–44)
AST: 18 IU/L (ref 0–40)
Albumin: 3.8 g/dL — ABNORMAL LOW (ref 3.9–4.9)
Alkaline Phosphatase: 72 IU/L (ref 44–121)
BUN/Creatinine Ratio: 14 (ref 10–24)
BUN: 11 mg/dL (ref 8–27)
Bilirubin Total: 0.3 mg/dL (ref 0.0–1.2)
CO2: 23 mmol/L (ref 20–29)
Calcium: 8.9 mg/dL (ref 8.6–10.2)
Chloride: 105 mmol/L (ref 96–106)
Creatinine, Ser: 0.79 mg/dL (ref 0.76–1.27)
Globulin, Total: 2.3 g/dL (ref 1.5–4.5)
Glucose: 93 mg/dL (ref 70–99)
Potassium: 4.3 mmol/L (ref 3.5–5.2)
Sodium: 141 mmol/L (ref 134–144)
Total Protein: 6.1 g/dL (ref 6.0–8.5)
eGFR: 99 mL/min/{1.73_m2} (ref >59)

## 2023-08-08 LAB — LIPID PANEL
Chol/HDL Ratio: 3.2 ratio (ref 0.0–5.0)
Cholesterol, Total: 136 mg/dL (ref 100–199)
HDL: 43 mg/dL (ref >39)
LDL Chol Calc (NIH): 79 mg/dL (ref 0–99)
Triglycerides: 72 mg/dL (ref 0–149)
VLDL Cholesterol Cal: 14 mg/dL (ref 5–40)

## 2023-08-14 NOTE — Progress Notes (Unsigned)
 Electrophysiology Office Note:   Date:  08/15/2023  ID:  [name], DOB 10/10/1960, MRN [id number]  Primary Cardiologist: Sammy Crisp, MD Electrophysiologist: Richardo Chandler, MD      History of Present Illness:   [name] is a 63 y.o. male with h/o syncope, atypical chest pain, NICM, pSVT, HFmrEF, HTN, unexplained weight loss who is being seen today for loop explant and reimplant at the request of Dr. Rodolfo Clan.   He has an abandoned ILR, which was initially implanted in 2018 to further evaluate syncope. He had a recent ER visit in January 2025 for syncope, during which he endorsed a prodrome of nausea > chest pain > sweating and then loss of consciousness. In the ED, he had an EKG which showed AFib with rapid ventricular rates. He subsequently followed up with Suzann Riddle and endorsed another presyncopal event. Because of this, loop recorder explant and reimplant was agreed upon. Patient presents today for scheduled loop explant and implant. He reports feeling relatively well. He is concerned about having another syncopal event. Would like to proceed with procedure as scheduled. No new or acute complaints.   Review of systems complete and found to be negative unless listed in HPI.   EP Information / Studies Reviewed:    EKG is ordered today. Personal review as below.  EKG Interpretation Date/Time:  Tuesday Aug 15 2023 08:55:35 EDT Ventricular Rate:  62 PR Interval:  140 QRS Duration:  90 QT Interval:  382 QTC Calculation: 387 R Axis:   84  Text Interpretation: Normal sinus rhythm Normal ECG When compared with ECG of 09-Jun-2023 10:33, QRS axis Shifted right Confirmed by Ardeen Kohler [phone]) on 08/15/2023 9:53:59 AM   Cardiac MRI 06/07/23:  IMPRESSION: 1.  Normal LV size and systolic function.  LVEF 55%. 2.  No LGE or scar 3.  No evidence for infiltrative or inflammatory disease 4.  Moderately dilated RV size, normal RV function.  Echo 04/14/23:  1. Left ventricular ejection  fraction, by estimation, is 45 to 50%. The  left ventricle has mildly decreased function. The left ventricle  demonstrates global hypokinesis. Left ventricular diastolic parameters  were normal. The average left ventricular  global longitudinal strain is -20.5 %. The global longitudinal strain is  normal.   2. Right ventricular systolic function is normal. The right ventricular  size is normal.   3. The mitral valve is normal in structure. Trivial mitral valve  regurgitation.   4. The aortic valve is tricuspid. Aortic valve regurgitation is not  visualized.   5. The inferior vena cava is normal in size with greater than 50%  respiratory variability, suggesting right atrial pressure of 3 mmHg.   Risk Assessment/Calculations:    CHA2DS2-VASc Score = 2   This indicates a 2.2% annual risk of stroke. The patient's score is based upon: CHF History: 1 HTN History: 0 Diabetes History: 0 Stroke History: 0 Vascular Disease History: 1 Age Score: 0 Gender Score: 0             Physical Exam:   VS:  BP 110/62   Pulse 62   Ht 6\' 3"  (1.905 m)   Wt 165 lb (74.8 kg)   SpO2 99%   BMI 20.62 kg/m    Wt Readings from Last 3 Encounters:  08/15/23 165 lb (74.8 kg)  08/07/23 168 lb 9.6 oz (76.5 kg)  06/09/23 168 lb 3.2 oz (76.3 kg)     GEN: Well nourished, well developed in no acute distress NECK:  No JVD CARDIAC: Normal rate, regular rhythm RESPIRATORY:  Normal work of breathing ABDOMEN: Soft, non-distended EXTREMITIES:  No edema; No deformity   ASSESSMENT AND PLAN:    #. Syncope: Recurrent. Has loop recorder that has reached EOL. -Explained risks, benefits, and alternatives to loop recorder explant and reimplant implantation, including but not limited to bleeding, infection, damage to heart or lungs. Pt verbalized understanding and agrees to proceed if indicated.   #. Paroxysmal atrial fibrillation:  #. Secondary hypercoagulable state due to atrial fibrillation: CHADSVASC score of  2. -Continue Eliquis  5mg  BID.  -Continue metoprolol  tartrate 25mg  twice daily. -Monitor burden with loop recorder.   #. Heart failure with mildly reduced LVEF: Appears well compensated.  -Continue GDMT regimen of Entresto  24-26mg  BID, spironolactone  12.5mg , metoprolol  25mg  BID, empagliflozin  10mg  daily.   SURGEON:  Ardeen Kohler, MD     PREPROCEDURE DIAGNOSIS:  Syncope    POSTPROCEDURE DIAGNOSIS: Syncope     PROCEDURES:   1. Implantable loop recorder implantation    INTRODUCTION:  [name] presents with a history of syncope The costs of loop recorder monitoring have been discussed with the patient.    DESCRIPTION OF PROCEDURE:  Informed written consent was obtained.   Time Out Completed with RN    The patient required no sedation for the procedure today.  His existing loop recorder was easily palpated over the left parasternal region. The patients left chest was therefore prepped and draped in the usual sterile fashion over the existing device. The skin overlying the device was infiltrated with lidocaine  for local analgesia.  A 0.5-cm incision was made over the proximal end of the device. The device was dissected free from tissue and removed with a hemostat. The existing subcutaneous ILR pocket was modified to accommodate a new device using a combination of sharp and blunt dissection.  A [employer] Reveal LINQ 2 implantable loop recorder (serial # [id number]) was then placed into the pocket  R waves were very prominent and measuring  0.25mV .  Steri- Strips and a sterile dressing were then applied.  There were no early apparent complications.     CONCLUSIONS:   1. Successful implantation of a implantable loop recorder for a history of Syncope.  2. No early apparent complications.    Follow up with EP APP in 6 months  Signed, Ardeen Kohler, MD

## 2023-08-15 ENCOUNTER — Encounter: Payer: Self-pay | Admitting: Cardiology

## 2023-08-15 ENCOUNTER — Ambulatory Visit: Attending: Cardiology | Admitting: Cardiology

## 2023-08-15 ENCOUNTER — Ambulatory Visit: Admitting: Cardiology

## 2023-08-15 VITALS — BP 110/62 | HR 62 | Ht 75.0 in | Wt 165.0 lb

## 2023-08-15 DIAGNOSIS — D6869 Other thrombophilia: Secondary | ICD-10-CM

## 2023-08-15 DIAGNOSIS — R55 Syncope and collapse: Secondary | ICD-10-CM | POA: Diagnosis not present

## 2023-08-15 DIAGNOSIS — I471 Supraventricular tachycardia, unspecified: Secondary | ICD-10-CM | POA: Diagnosis not present

## 2023-08-15 DIAGNOSIS — I480 Paroxysmal atrial fibrillation: Secondary | ICD-10-CM

## 2023-08-15 NOTE — Patient Instructions (Addendum)
Medication Instructions:  Your physician recommends that you continue on your current medications as directed. Please refer to the Current Medication list given to you today.  Labwork: None ordered.  Testing/Procedures: None ordered.  Follow-Up:  Your physician wants you to follow-up in: 6 months with Levy Sjogren, NP.  You will receive a reminder letter in the mail two months in advance. If you don't receive a letter, please call our office to schedule the follow-up appointment.    Implantable Loop Recorder Placement, Care After This sheet gives you information about how to care for yourself after your procedure. Your health care provider may also give you more specific instructions. If you have problems or questions, contact your health care provider. What can I expect after the procedure? After the procedure, it is common to have: Soreness or discomfort near the incision. Some swelling or bruising near the incision.  Follow these instructions at home: Incision care  Monitor your cardiac device site for redness, swelling, and drainage. Call the device clinic at [phone] if you experience these symptoms or fever/chills.  Keep the large square bandage on your site for 24 hours and then you may remove it yourself. Keep the steri-strips underneath in place.   You may shower after 72 hours / 3 days from your procedure with the steri-strips in place. They will usually fall off on their own, or may be removed after 10 days. Pat dry.   Avoid lotions, ointments, or perfumes over your incision until it is well-healed.  Please do not submerge in water until your site is completely healed.   Your device is MRI compatible.   Remote monitoring is used to monitor your cardiac device from home. This monitoring is scheduled every month by our office. It allows [place] to keep an eye on the function of your device to ensure it is working properly.  If your wound site starts to bleed apply  pressure.    For help with the monitor please call [employer] Monitor Support Specialist directly at [phone].    If you have any questions/concerns please call the device clinic at [phone].  Activity  Return to your normal activities.  General instructions Follow instructions from your health care provider about how to manage your implantable loop recorder and transmit the information. Learn how to activate a recording if this is necessary for your type of device. You may go through a metal detection gate, and you may let someone hold a metal detector over your chest. Show your ID card if needed. Do not have an MRI unless you check with your health care provider first. Take over-the-counter and prescription medicines only as told by your health care provider. Keep all follow-up visits as told by your health care provider. This is important. Contact a health care provider if: You have redness, swelling, or pain around your incision. You have a fever. You have pain that is not relieved by your pain medicine. You have triggered your device because of fainting (syncope) or because of a heartbeat that feels like it is racing, slow, fluttering, or skipping (palpitations). Get help right away if you have: Chest pain. Difficulty breathing. Summary After the procedure, it is common to have soreness or discomfort near the incision. Change your dressing as told by your health care provider. Follow instructions from your health care provider about how to manage your implantable loop recorder and transmit the information. Keep all follow-up visits as told by your health care provider. This is important. This  information is not intended to replace advice given to you by your health care provider. Make sure you discuss any questions you have with your health care provider. Document Released: 03/09/2015 Document Revised: 05/13/2017 Document Reviewed: 05/13/2017 Elsevier Patient Education   2020 [employer].

## 2023-08-17 ENCOUNTER — Ambulatory Visit: Admitting: Internal Medicine

## 2023-08-19 IMAGING — MR MR ABDOMEN WO/W CM
18 series · 48 of 48 positions shown · IV contrast (gadavist)
Comparison: Cardiac CTA on 11/16/2020

CLINICAL DATA: Liver lesions seen on recent cardiac CT.

EXAM:
MRI ABDOMEN WITHOUT AND WITH CONTRAST
TECHNIQUE: Multiplanar multisequence MR imaging of the abdomen was performed
both before and after the administration of intravenous contrast.
CONTRAST:  10mL GADAVIST GADOBUTROL 1 MMOL/ML IV SOLN

[Series 3: T2 · coronal · 6.5mm · 1.19mm/px · 2 of 30 slices shown (1 of 2)]
[im 1/30]
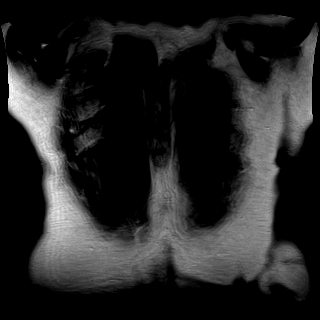
[im 30/30]
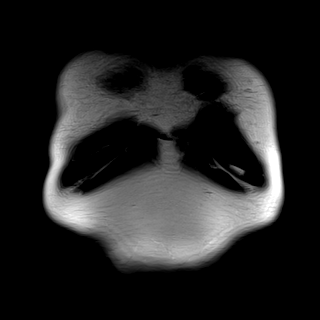

[Series 4: T2 · axial · 6.0mm · 1.19mm/px · z∈[-87,+179]mm · 2 of 38 slices shown (2 of 2)]
[im 1/38]
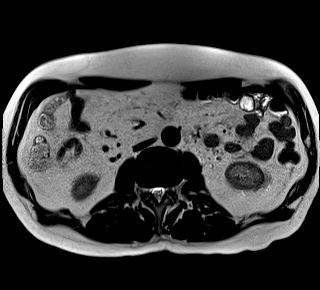
[im 38/38]
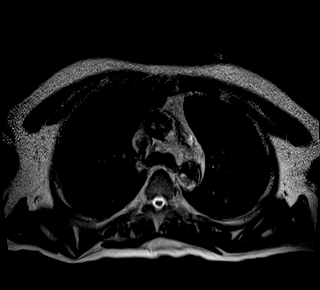

[Series 6: T2 fat-sat · axial · 6.0mm · 1.19mm/px · z∈[-80,+172]mm · 2 of 36 slices shown]
[im 1/36]
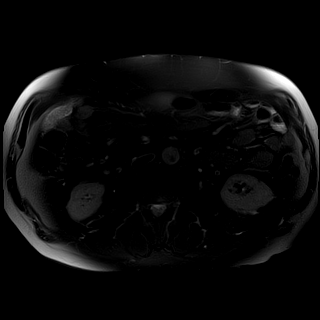
[im 36/36]
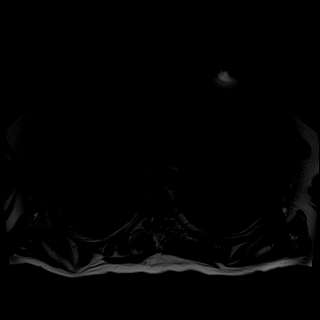

[Series 7: ax dwi_tracew · axial · 6.0mm · 1.42mm/px · z∈[-80,+172]mm · 4 of 108 slices shown]
[im 1/108]
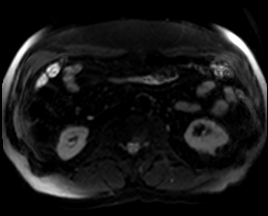
[im 36/108]
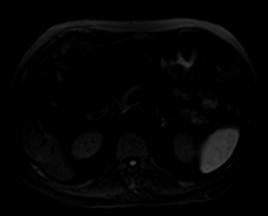
[im 72/108]
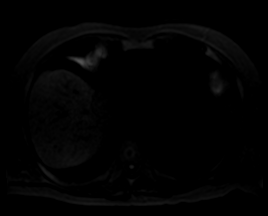
[im 108/108]
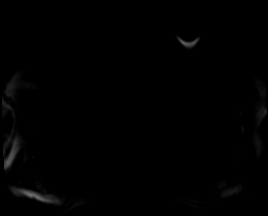

[Series 8: ax dwi_adc · axial · 6.0mm · 1.42mm/px · 1 of 36 slices shown]
[im 1/36]
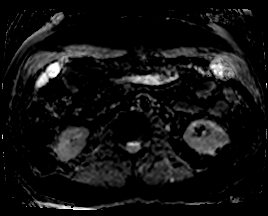

[Series 9: T1 · axial · 3.0mm · 1.19mm/px · z∈[-79,+182]mm · 3 of 88 slices shown (1 of 2)]
[im 1/88]
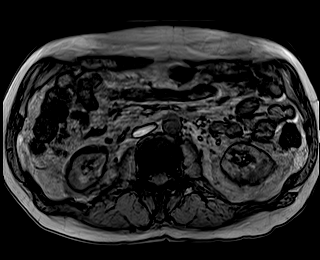
[im 44/88]
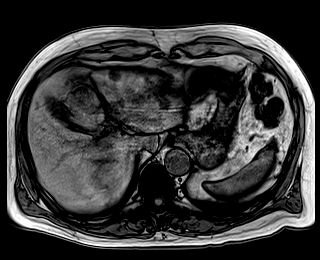
[im 88/88]
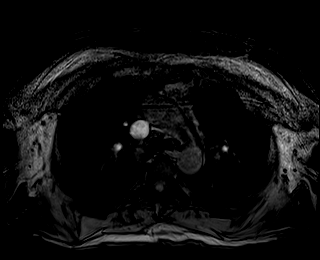

[Series 10: T1 · axial · 3.0mm · 1.19mm/px · z∈[-79,+182]mm · 3 of 88 slices shown (2 of 2)]
[im 1/88]
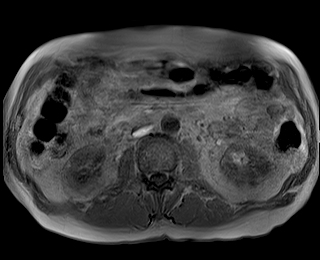
[im 44/88]
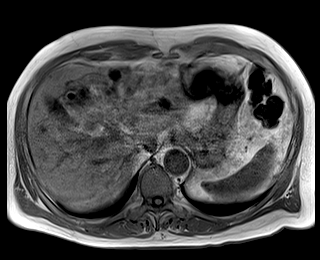
[im 88/88]
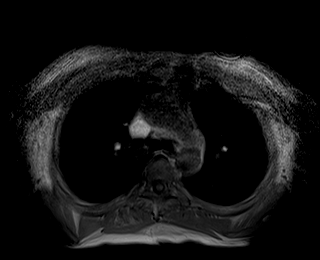

[Series 11: bSSFP · axial · 6.0mm · 0.74mm/px · 1 of 38 slices shown]
[im 1/38]
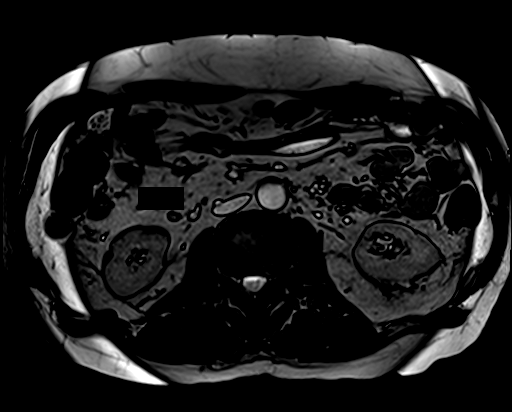

[Series 12: T1 dynamic fat-sat · axial · non-contrast · 3.0mm · 1.19mm/px · z∈[-72,+165]mm · 3 of 80 slices shown (1 of 5)]
[im 1/80]
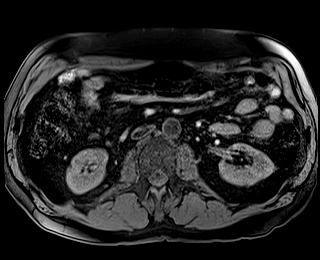
[im 40/80]
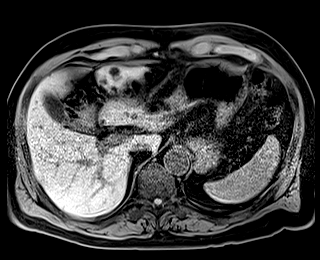
[im 80/80]
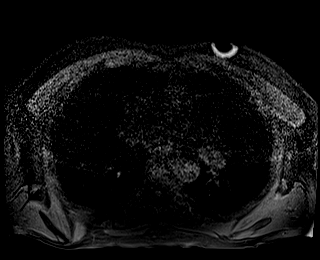

[Series 13: T1 dynamic fat-sat post-contrast · axial · 3.0mm · 1.19mm/px · z∈[-72,+165]mm · 3 of 80 slices shown (1 of 4)]
[im 1/80]
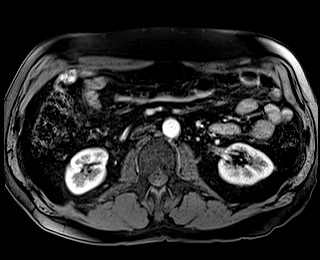
[im 40/80]
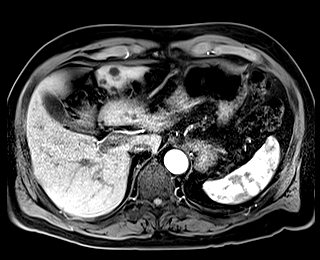
[im 80/80]
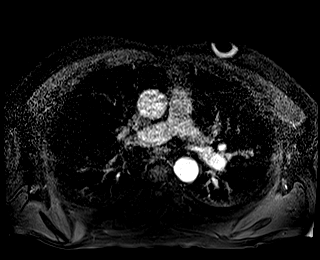

[Series 14: T1 dynamic fat-sat · axial · 3.0mm · 1.19mm/px · z∈[-72,+165]mm · 3 of 80 slices shown (2 of 5)]
[im 1/80]
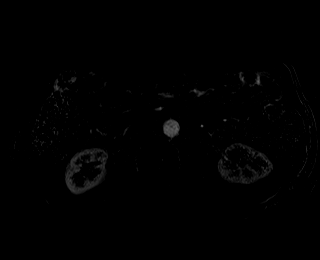
[im 40/80]
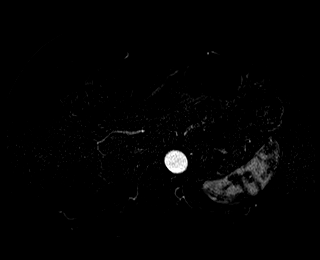
[im 80/80]
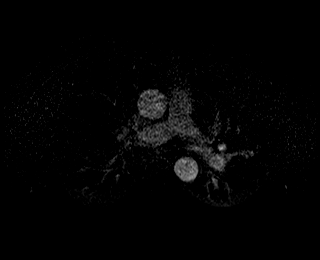

[Series 15: T1 dynamic fat-sat post-contrast · axial · 3.0mm · 1.19mm/px · z∈[-72,+165]mm · 3 of 80 slices shown (2 of 4)]
[im 1/80]
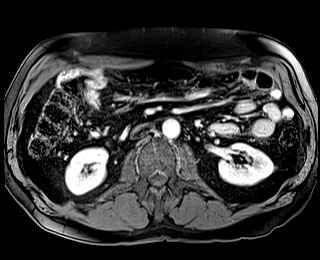
[im 40/80]
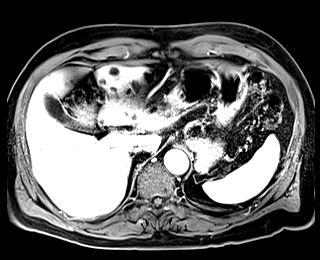
[im 80/80]
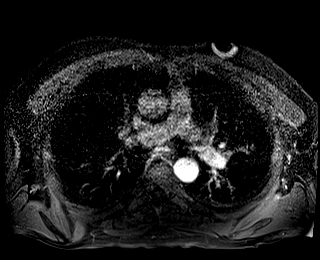

[Series 16: T1 dynamic fat-sat · axial · 3.0mm · 1.19mm/px · z∈[-72,+165]mm · 3 of 80 slices shown (3 of 5)]
[im 1/80]
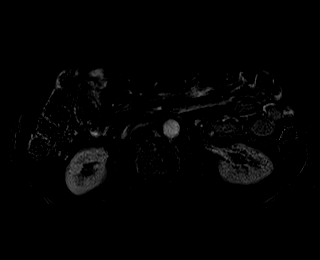
[im 40/80]
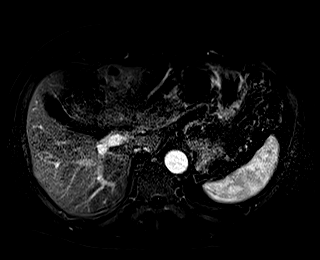
[im 80/80]
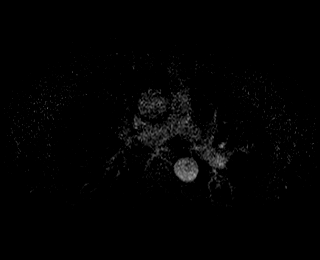

[Series 17: T1 dynamic fat-sat post-contrast · axial · 3.0mm · 1.19mm/px · z∈[-72,+165]mm · 3 of 80 slices shown (3 of 4)]
[im 1/80]
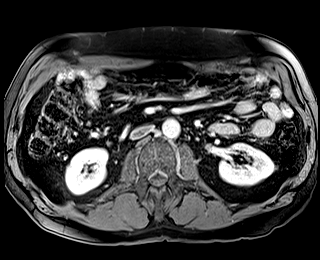
[im 40/80]
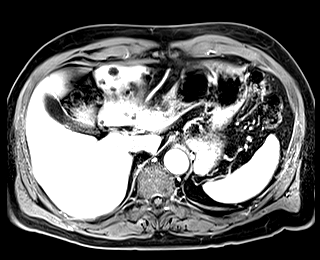
[im 80/80]
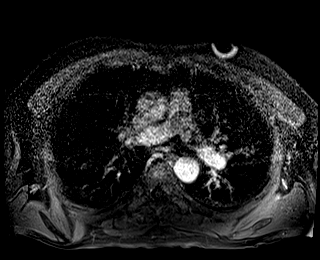

[Series 18: T1 dynamic fat-sat · axial · 3.0mm · 1.19mm/px · z∈[-72,+165]mm · 3 of 80 slices shown (4 of 5)]
[im 1/80]
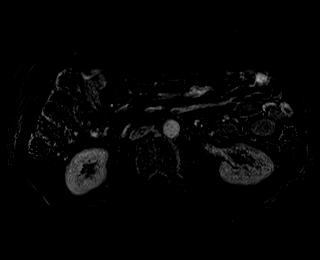
[im 40/80]
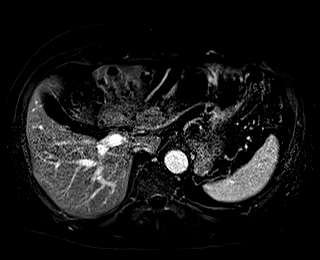
[im 80/80]
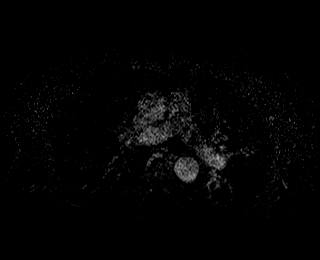

[Series 19: T1 dynamic post-contrast · coronal · 3.0mm · 1.31mm/px · 3 of 80 slices shown]
[im 1/80]
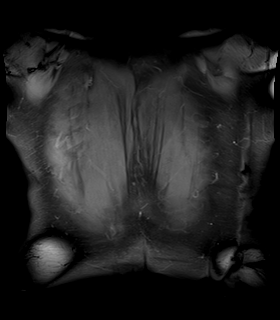
[im 40/80]
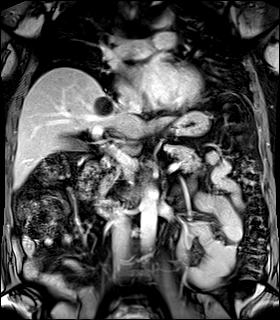
[im 80/80]
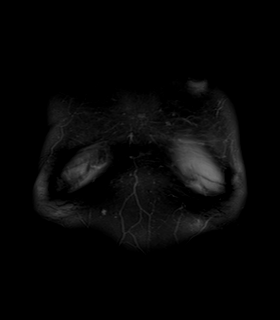

[Series 20: T1 dynamic fat-sat post-contrast · axial · 3.0mm · 1.19mm/px · z∈[-72,+165]mm · 3 of 80 slices shown (4 of 4)]
[im 1/80]
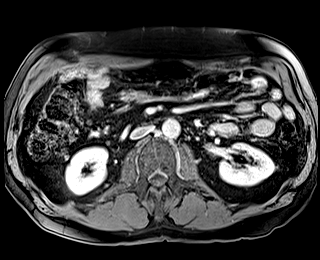
[im 40/80]
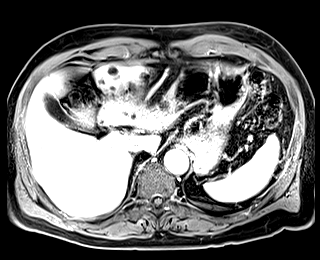
[im 80/80]
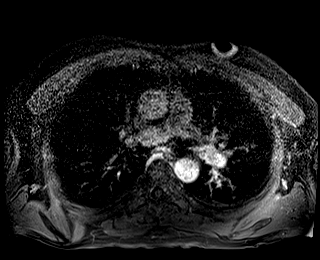

[Series 21: T1 dynamic fat-sat · axial · 3.0mm · 1.19mm/px · z∈[-72,+165]mm · 3 of 80 slices shown (5 of 5)]
[im 1/80]
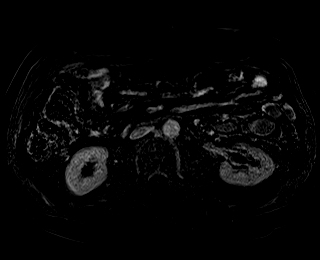
[im 40/80]
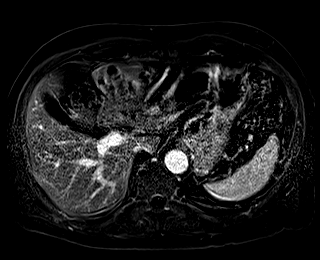
[im 80/80]
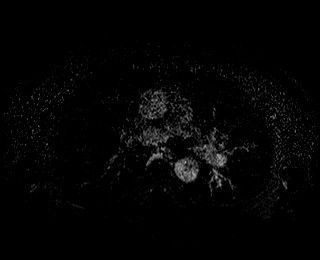

[48 of 48 positions shown; findings below may reference images not displayed]

FINDINGS: Lower chest: No acute findings.

Hepatobiliary: Multiple small cysts are seen throughout the liver,
with the largest in the superior left hepatic lobe measuring 4 cm.
Several sub-cm lesions are too small to characterize, and may
represent tiny cysts or benign hemangiomas. No suspicious hepatic
masses are identified. Gallbladder is unremarkable. No evidence of
biliary ductal dilatation.

Pancreas:  No mass or inflammatory changes.

Spleen:  Within normal limits in size and appearance.

Adrenals/Urinary Tract: No masses identified. Left extrarenal pelvis
incidentally noted. No evidence of hydronephrosis.

Stomach/Bowel: Visualized portion unremarkable.

Vascular/Lymphatic: No pathologically enlarged lymph nodes
identified. No acute vascular findings.

Other:  None.

Musculoskeletal:  No suspicious bone lesions identified.
IMPRESSION: Multiple benign hepatic cysts. No suspicious hepatic masses or other
significant abnormality identified.

## 2023-09-15 ENCOUNTER — Ambulatory Visit (INDEPENDENT_AMBULATORY_CARE_PROVIDER_SITE_OTHER)

## 2023-09-15 DIAGNOSIS — R55 Syncope and collapse: Secondary | ICD-10-CM

## 2023-09-15 LAB — CUP PACEART REMOTE DEVICE CHECK
Date Time Interrogation Session: 20250606102122
Implantable Pulse Generator Implant Date: 20250506

## 2023-10-02 ENCOUNTER — Ambulatory Visit: Payer: BC Managed Care – PPO | Admitting: Cardiology

## 2023-10-04 ENCOUNTER — Other Ambulatory Visit: Payer: Self-pay | Admitting: Internal Medicine

## 2023-10-05 ENCOUNTER — Ambulatory Visit: Admitting: Nurse Practitioner

## 2023-10-16 ENCOUNTER — Ambulatory Visit

## 2023-10-16 DIAGNOSIS — R55 Syncope and collapse: Secondary | ICD-10-CM | POA: Diagnosis not present

## 2023-10-16 NOTE — Progress Notes (Deleted)
 Office Visit    Patient Name: [name] Date of Encounter: 10/16/2023  Primary Care Provider:  Melvin Pao, NP Primary Cardiologist:  Lonni Hanson, MD  Cardiology APP:  Vivienne Lonni Ingle, NP  Electrophysiologist:  Elspeth Sage, MD  Electrophysiology APP:  Riddle, Suzann, NP   Chief Complaint    63 y.o. 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 20% mLAD - med mgmt; b.) MV 10/07/2020: no ischemia; c.) cCTA 11/16/2020: Ca2+ 108 (66% ile); 25% pLAD, < 25% dRCA   DOE (dyspnea on exertion)    Esophageal dysphagia    Family history of blood clots    a.) hypercoagulable work-up 09/2021 was negative; PT gene mutation, Factor V leiden mutation, Protein C, S, and AT III all normal/negative   Fatigue due to sleep pattern disturbance 10/16/2013   GERD (gastroesophageal reflux disease) 2023   Heart failure with mid-range ejection fraction (HFmEF) (HCC) 09/29/2020   a.) TTE 09/29/2020: EF 45-50%, sev bas-mid inferior HK, GLS -15.5%, mild MR, G1DD; b.) TTE 08/06/2021: EF 45-50%, no RWMAs, GLS -16.5%, mild MR, G2DD   History of 2019 novel coronavirus disease (COVID-19) 01/2020   Hypertension 1976   only on meds for a year   Implantable loop recorder 12/09/2013   a.) [employer] ILR device   Long term current use of aspirin     Mixed hyperlipidemia    Multiple lipomas    NICM (nonischemic cardiomyopathy) (HCC)    a.) TTE 10/08/2014: EF 50-55%; b.) TTE 09/29/2020: EF 45-50%; c.) TTE 08/06/2021: EF 45-50%   Nonsustained ventricular tachycardia (HCC) 05/2014    a.) noted on ILR in 05/2014; b.) holter 10/2021: NSVT x 2 episodes; longest lasted 6 beats at a rate of 197 bpm   Osteoporosis    a.) history of associated rib fracture; b.) on oral bisphosphonate therapy   Palpitations (PVCs)    a.) Zio 10/25/2021: occasional SVE (15,761, 1.0%), SVE couplets (349, <1.0%), and SVE triplets (6. <1.0%)   Pneumonia due to COVID-19 virus 01/2020   a.) hospitalized and Tx'd with MAB infusions   PSVT (paroxysmal supraventricular tachycardia) (HCC) 10/2021   a.) holter 10/2021: 148 SVT episodes with max HR 226 bpm   Seasonal allergies    Sinus tachycardia    Stricture and stenosis of esophagus    Syncope 10/16/2013   Past Surgical History:  Procedure Laterality Date   COLONOSCOPY WITH PROPOFOL  N/A 11/24/2020   Procedure: COLONOSCOPY WITH PROPOFOL ;  Surgeon: Jinny Carmine, MD;  Location: [facility] ENDOSCOPY;  Service: Endoscopy;  Laterality: N/A;   COLONOSCOPY WITH PROPOFOL  N/A 01/18/2023   Procedure: COLONOSCOPY WITH PROPOFOL ;  Surgeon: Jinny Carmine, MD;  Location: [facility] ENDOSCOPY;  Service: Endoscopy;  Laterality: N/A;   ESOPHAGEAL DILATION  11/29/2022   Procedure: ESOPHAGEAL DILATION;  Surgeon: Jinny Carmine, MD;  Location: [facility] ENDOSCOPY;  Service: Endoscopy;;   ESOPHAGOGASTRODUODENOSCOPY (EGD) WITH PROPOFOL  N/A 11/29/2022   Procedure: ESOPHAGOGASTRODUODENOSCOPY (EGD) WITH PROPOFOL ;  Surgeon: Jinny Carmine, MD;  Location: [facility] ENDOSCOPY;  Service: Endoscopy;  Laterality: N/A;   LEFT  HEART CATHETERIZATION WITH CORONARY ANGIOGRAM N/A 10/17/2013   Procedure: LEFT HEART CATHETERIZATION WITH CORONARY ANGIOGRAM;  Surgeon: Lonni JONETTA Cash, MD;  Location: [facility] CATH LAB;  Service: Cardiovascular;  Laterality: N/A;   LIPOMA EXCISION Right 03/22/2023   Procedure: EXCISION LIPOMA multiple, right thigh lipomas x 3, right forearm x 1;  Surgeon: Desiderio Schanz, MD;  Location: [facility] ORS;  Service: General;  Laterality: Right;   LOOP RECORDER IMPLANT N/A 12/09/2013   Procedure:  LOOP RECORDER IMPLANT;  Surgeon: Elspeth JAYSON Sage, MD;  Location: [facility] CATH LAB;  Service: Cardiovascular;  Laterality: N/A;   TRANSURETHRAL RESECTION OF PROSTATE N/A 01/10/2022   Procedure: TRANSURETHRAL RESECTION OF THE PROSTATE (TURP);  Surgeon: Penne Knee, MD;  Location: [facility] ORS;  Service: Urology;  Laterality: N/A;    Allergies  Allergies  Allergen Reactions   Amoxicillin Rash       History of Present Illness      63 y.o. y/o male {There is no content from the last Narrative History section.}     Objective   Home Medications    Current Outpatient Medications  Medication Sig Dispense Refill   acetaminophen  (TYLENOL ) 500 MG tablet Take 2 tablets (1,000 mg total) by mouth every 6 (six) hours as needed for mild pain (pain score 1-3).     alendronate  (FOSAMAX ) 70 MG tablet Take 1 tablet (70 mg total) by mouth once a week. Take with a full glass of water on an empty stomach. 12 tablet 3   apixaban  (ELIQUIS ) 5 MG TABS tablet Take 1 tablet (5 mg total) by mouth 2 (two) times daily. 28 tablet    atorvastatin  (LIPITOR) 40 MG tablet Take 1 tablet (40 mg total) by mouth daily. Please keep upcoming appointment for future refills. Thank you. 90 tablet 3   cyclobenzaprine  (FLEXERIL ) 5 MG tablet Take 1 tablet (5 mg total) by mouth at bedtime. 30 tablet 1   famotidine  (PEPCID ) 20 MG tablet TAKE 1 TABLET BY MOUTH 1 HOUR BEFORE BEDTIME (Patient taking differently: Take 20 mg by mouth at bedtime.) 30 tablet 11   JARDIANCE  10 MG TABS tablet TAKE 1 TABLET BY MOUTH ONCE EVERY MORNING WITH BREAKFAST 30 tablet 11   metoprolol  tartrate (LOPRESSOR ) 25 MG tablet Take 1 tablet (25 mg total) by mouth 2 (two) times daily. 180 tablet 3   omeprazole  (PRILOSEC) 20 MG capsule TAKE 1 CAPSULE BY MOUTH TWICE DAILY BEFORE A MEAL 180 capsule 1   sacubitril-valsartan (ENTRESTO ) 24-26 MG Take 1 tablet by mouth 2 (two) times daily. Please keep scheduled appointment for future refills. Thank you. 180 tablet 3    spironolactone  (ALDACTONE ) 25 MG tablet Take 0.5 tablets (12.5 mg total) by mouth daily. KEEP OV. 45 tablet 3   No current facility-administered medications for this visit.     Physical Exam    VS:  There were no vitals taken for this visit. , BMI There is no height or weight on file to calculate BMI.          GEN: Well nourished, well developed, in no acute distress. HEENT: normal. Neck: Supple, no JVD, carotid bruits, or masses. Cardiac: RRR, no murmurs, rubs, or gallops. No clubbing, cyanosis, edema.  Radials 2+/PT 2+ and equal bilaterally.  Respiratory:  Respirations regular and unlabored, clear to auscultation bilaterally. GI: Soft, nontender, nondistended, BS + x 4. MS: no deformity or atrophy. Skin: warm and dry, no rash. Neuro:  Strength and sensation are intact. Psych: Normal affect.  Accessory Clinical Findings  ECG personally reviewed by me today -    *** - no acute changes.  Lab Results  Component Value Date   WBC 7.8 04/14/2023   HGB 16.1 04/14/2023   HCT 48.1 04/14/2023   MCV 93 04/14/2023   PLT 357 04/14/2023   Lab Results  Component Value Date   CREATININE 0.79 08/07/2023   BUN 11 08/07/2023   NA 141 08/07/2023   K 4.3 08/07/2023   CL 105 08/07/2023   CO2 23 08/07/2023   Lab Results  Component Value Date   ALT 16 08/07/2023   AST 18 08/07/2023   ALKPHOS 72 08/07/2023   BILITOT 0.3 08/07/2023   Lab Results  Component Value Date   CHOL 136 08/07/2023   HDL 43 08/07/2023   LDLCALC 79 08/07/2023   TRIG 72 08/07/2023   CHOLHDL 3.2 08/07/2023    Lab Results  Component Value Date   HGBA1C 5.6 02/08/2023   Lab Results  Component Value Date   TSH 0.891 04/14/2023       Assessment & Plan    1.  ***  Lonni Meager, NP 10/16/2023, 1:45 PM

## 2023-10-17 LAB — CUP PACEART REMOTE DEVICE CHECK
Date Time Interrogation Session: 20250707102114
Implantable Pulse Generator Implant Date: 20250506

## 2023-10-18 ENCOUNTER — Ambulatory Visit: Admitting: Nurse Practitioner

## 2023-10-31 NOTE — Progress Notes (Signed)
 [facility] Summary Report / Loop Recorder

## 2023-11-03 ENCOUNTER — Encounter: Payer: BC Managed Care – PPO | Admitting: *Deleted

## 2023-11-03 ENCOUNTER — Other Ambulatory Visit: Payer: Self-pay

## 2023-11-03 DIAGNOSIS — Z006 Encounter for examination for normal comparison and control in clinical research program: Secondary | ICD-10-CM

## 2023-11-03 MED ORDER — STUDY - VICTORION-1 PREVENT - INCLISIRAN 300 MG/1.5 ML OR PLACEBO SQ INJECTION (PI-STUCKEY)
300.0000 mg | INJECTION | SUBCUTANEOUS | Status: AC
  Administered 2023-11-03: 300 mg via SUBCUTANEOUS
  Filled 2023-11-03: qty 1.5
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
## 2023-11-16 ENCOUNTER — Ambulatory Visit

## 2023-11-16 DIAGNOSIS — R55 Syncope and collapse: Secondary | ICD-10-CM

## 2023-11-21 ENCOUNTER — Ambulatory Visit: Admitting: Nurse Practitioner

## 2023-12-06 ENCOUNTER — Other Ambulatory Visit: Payer: Self-pay | Admitting: Internal Medicine

## 2023-12-14 ENCOUNTER — Other Ambulatory Visit
Admission: RE | Admit: 2023-12-14 | Discharge: 2023-12-14 | Disposition: A | Discharge status: Home / Self Care | Source: Ambulatory Visit | Attending: Physician Assistant | Admitting: Physician Assistant

## 2023-12-14 ENCOUNTER — Other Ambulatory Visit: Payer: Self-pay

## 2023-12-14 ENCOUNTER — Encounter: Payer: Self-pay | Admitting: Cardiology

## 2023-12-14 ENCOUNTER — Ambulatory Visit: Attending: Cardiology | Admitting: Physician Assistant

## 2023-12-14 VITALS — BP 108/80 | HR 72 | Ht 75.0 in | Wt 166.2 lb

## 2023-12-14 DIAGNOSIS — R519 Headache, unspecified: Secondary | ICD-10-CM

## 2023-12-14 DIAGNOSIS — I480 Paroxysmal atrial fibrillation: Secondary | ICD-10-CM

## 2023-12-14 DIAGNOSIS — I2510 Atherosclerotic heart disease of native coronary artery without angina pectoris: Secondary | ICD-10-CM | POA: Diagnosis not present

## 2023-12-14 DIAGNOSIS — I471 Supraventricular tachycardia, unspecified: Secondary | ICD-10-CM | POA: Diagnosis not present

## 2023-12-14 DIAGNOSIS — E538 Deficiency of other specified B group vitamins: Secondary | ICD-10-CM | POA: Insufficient documentation

## 2023-12-14 DIAGNOSIS — I5022 Chronic systolic (congestive) heart failure: Secondary | ICD-10-CM

## 2023-12-14 DIAGNOSIS — R634 Abnormal weight loss: Secondary | ICD-10-CM

## 2023-12-14 DIAGNOSIS — R200 Anesthesia of skin: Secondary | ICD-10-CM

## 2023-12-14 DIAGNOSIS — R55 Syncope and collapse: Secondary | ICD-10-CM

## 2023-12-14 DIAGNOSIS — Z006 Encounter for examination for normal comparison and control in clinical research program: Secondary | ICD-10-CM

## 2023-12-14 LAB — COMPREHENSIVE METABOLIC PANEL WITH GFR
ALT: 10 U/L (ref 0–44)
AST: 14 U/L — ABNORMAL LOW (ref 15–41)
Albumin: 3.9 g/dL (ref 3.5–5.0)
Alkaline Phosphatase: 58 U/L (ref 38–126)
Anion gap: 8 (ref 5–15)
BUN: 27 mg/dL — ABNORMAL HIGH (ref 8–23)
CO2: 27 mmol/L (ref 22–32)
Calcium: 9.1 mg/dL (ref 8.9–10.3)
Chloride: 101 mmol/L (ref 98–111)
Creatinine, Ser: 0.54 mg/dL — ABNORMAL LOW (ref 0.61–1.24)
GFR, Estimated: >60 mL/min (ref >60)
Glucose, Bld: 85 mg/dL (ref 70–99)
Potassium: 4.1 mmol/L (ref 3.5–5.1)
Sodium: 136 mmol/L (ref 135–145)
Total Bilirubin: 0.8 mg/dL (ref 0.0–1.2)
Total Protein: 7.2 g/dL (ref 6.5–8.1)

## 2023-12-14 LAB — CBC
HCT: 47.5 % (ref 39.0–52.0)
Hemoglobin: 15.8 g/dL (ref 13.0–17.0)
MCH: 30.1 pg (ref 26.0–34.0)
MCHC: 33.3 g/dL (ref 30.0–36.0)
MCV: 90.5 fL (ref 80.0–100.0)
Platelets: 275 K/uL (ref 150–400)
RBC: 5.25 MIL/uL (ref 4.22–5.81)
RDW: 12 % (ref 11.5–15.5)
WBC: 6.3 K/uL (ref 4.0–10.5)
nRBC: 0 % (ref 0.0–0.2)

## 2023-12-14 LAB — VITAMIN B12: Vitamin B-12: <150 pg/mL — ABNORMAL LOW (ref 180–914)

## 2023-12-14 NOTE — Patient Instructions (Signed)
 AMBulatory referral to Neurology  AMB referral to Primary Care at [facility] - Dr.Ziglar  [phone]   Medication Instructions:  Your physician recommends that you continue on your current medications as directed. Please refer to the Current Medication list given to you today.   *If you need a refill on your cardiac medications before your next appointment, please call your pharmacy*  Lab Work: Your provider would like for you to have following labs drawn today CBC, CMP, B-12.   If you have labs (blood work) drawn today and your tests are completely normal, you will receive your results only by: MyChart Message (if you have MyChart) OR A paper copy in the mail If you have any lab test that is abnormal or we need to change your treatment, we will call you to review the results.  Testing/Procedures: No test ordered today   Follow-Up: At [facility], you and your health needs are our priority.  As part of our continuing mission to provide you with exceptional heart care, our providers are all part of one team.  This team includes your primary Cardiologist (physician) and Advanced Practice Providers or APPs (Physician Assistants and Nurse Practitioners) who all work together to provide you with the care you need, when you need it.  Your next appointment:   6 month(s)  Provider:   You may see Lonni Hanson, MD or one of the following Advanced Practice Providers on your designated Care Team:   Lonni Meager, NP Lesley Maffucci, PA-C Bernardino Bring, PA-C Cadence Waterville, PA-C Tylene Lunch, NP Barnie Hila, NP
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
## 2023-12-15 ENCOUNTER — Ambulatory Visit: Payer: Self-pay | Admitting: Physician Assistant

## 2023-12-15 DIAGNOSIS — I471 Supraventricular tachycardia, unspecified: Secondary | ICD-10-CM

## 2023-12-15 DIAGNOSIS — E538 Deficiency of other specified B group vitamins: Secondary | ICD-10-CM

## 2023-12-15 MED ORDER — VITAMIN B-12 1000 MCG PO TABS: 1000.0000 ug | ORAL_TABLET | Freq: Every day | ORAL | 2 refills | Status: DC

## 2023-12-15 NOTE — Telephone Encounter (Signed)
Reviewed results and recommendations with patient and he verbalized understanding with no further questions at this time.  

## 2023-12-17 LAB — CUP PACEART REMOTE DEVICE CHECK
Date Time Interrogation Session: 20250907103650
Implantable Pulse Generator Implant Date: 20250506

## 2023-12-18 ENCOUNTER — Ambulatory Visit (INDEPENDENT_AMBULATORY_CARE_PROVIDER_SITE_OTHER)

## 2023-12-18 ENCOUNTER — Ambulatory Visit

## 2023-12-18 DIAGNOSIS — R55 Syncope and collapse: Secondary | ICD-10-CM

## 2023-12-18 LAB — CUP PACEART REMOTE DEVICE CHECK
Date Time Interrogation Session: 20250907103650
Implantable Pulse Generator Implant Date: 20250506

## 2023-12-28 DIAGNOSIS — E538 Deficiency of other specified B group vitamins: Secondary | ICD-10-CM | POA: Diagnosis not present

## 2023-12-28 DIAGNOSIS — I471 Supraventricular tachycardia, unspecified: Secondary | ICD-10-CM | POA: Diagnosis not present

## 2023-12-28 NOTE — Progress Notes (Signed)
 Remote Loop Recorder Transmission

## 2023-12-29 LAB — CBC
Hematocrit: 46.3 % (ref 37.5–51.0)
Hemoglobin: 15.2 g/dL (ref 13.0–17.7)
MCH: 30.5 pg (ref 26.6–33.0)
MCHC: 32.8 g/dL (ref 31.5–35.7)
MCV: 93 fL (ref 79–97)
Platelets: 263 x10E3/uL (ref 150–450)
RBC: 4.98 x10E6/uL (ref 4.14–5.80)
RDW: 12.2 % (ref 11.6–15.4)
WBC: 5.8 x10E3/uL (ref 3.4–10.8)

## 2023-12-29 LAB — VITAMIN B12: Vitamin B-12: 415 pg/mL (ref 232–1245)

## 2024-01-02 ENCOUNTER — Ambulatory Visit (INDEPENDENT_AMBULATORY_CARE_PROVIDER_SITE_OTHER): Admitting: Family Medicine

## 2024-01-02 ENCOUNTER — Encounter: Payer: Self-pay | Admitting: Family Medicine

## 2024-01-02 VITALS — BP 130/84 | HR 69 | Temp 97.7°F | Resp 18 | Ht 75.0 in | Wt 166.0 lb

## 2024-01-02 DIAGNOSIS — E559 Vitamin D deficiency, unspecified: Secondary | ICD-10-CM | POA: Insufficient documentation

## 2024-01-02 DIAGNOSIS — M810 Age-related osteoporosis without current pathological fracture: Secondary | ICD-10-CM | POA: Insufficient documentation

## 2024-01-02 DIAGNOSIS — I5022 Chronic systolic (congestive) heart failure: Secondary | ICD-10-CM | POA: Diagnosis not present

## 2024-01-02 DIAGNOSIS — G6289 Other specified polyneuropathies: Secondary | ICD-10-CM

## 2024-01-02 DIAGNOSIS — E538 Deficiency of other specified B group vitamins: Secondary | ICD-10-CM | POA: Diagnosis not present

## 2024-01-02 DIAGNOSIS — M8000XS Age-related osteoporosis with current pathological fracture, unspecified site, sequela: Secondary | ICD-10-CM

## 2024-01-02 DIAGNOSIS — G629 Polyneuropathy, unspecified: Secondary | ICD-10-CM | POA: Insufficient documentation

## 2024-01-02 MED ORDER — GABAPENTIN 300 MG PO CAPS
300.0000 mg | ORAL_CAPSULE | Freq: Every day | ORAL | 3 refills | Status: AC
Start: 2024-01-02 — End: ?

## 2024-01-02 NOTE — Assessment & Plan Note (Signed)
 Trial of gabapentin  300 mg nightly to see if it stops some of the pain in his arms and legs.  He has a referral to neurology.

## 2024-01-02 NOTE — Assessment & Plan Note (Signed)
 Is not taking any vitamin D  and is on Fosamax .  Needs vitamin D  for Fosamax  to work correctly.  Will check his vitamin D  level today.

## 2024-01-02 NOTE — Assessment & Plan Note (Signed)
 His B12 at diagnosis was less than 150.  Has been taking oral vitamin B12 at 1000 mcg daily.  Will check vitamin B12 level and see if he is able to absorb enough B12 orally.

## 2024-01-02 NOTE — Assessment & Plan Note (Signed)
 Followed by cardiology and on Jardiance  10 mg daily plus Entresto  24-25 twice daily.

## 2024-01-02 NOTE — Assessment & Plan Note (Signed)
 Has been on Fosamax  about 2 years but has not been taking vitamin D  or calcium  supplements.  Will check vitamin D  level today.  Encouraged him to take calcium  supplements 600 mg twice a day.  He had a DEXA scan 01/31/2022.  Needs it repeated after 02/01/2024.

## 2024-01-02 NOTE — Progress Notes (Signed)
 New Patient Office Visit  Subjective    Patient ID: [name], male    DOB: 1960/09/13  Age: 63 y.o. MRN: [id number]  CC:  Chief Complaint  Patient presents with   Establish Care   Numbness    Legs and arms    HPI [name] Discussed the use of AI scribe software for clinical note transcription with the patient, who gave verbal consent to proceed.  History of Present Illness   [name] is a 63 year old male with atrial fibrillation who presents with pain and numbness in his legs and arms.  He experiences severe pain and numbness in both legs and arms, with the left side being more affected. The pain in his left leg feels like a 'vice grip' from the calf to the thigh and has been ongoing for about three weeks. He has episodes of complete numbness in both legs, similar to the sensation of 'falling asleep'. Numbness in his arms has led to dropping objects.  He has a history of atrial fibrillation and is currently on Eliquis . He experiences rapid heart rates, sometimes exceeding 200 beats per minute, leading to episodes of syncope. He has an internal loop recorder that was recently replaced, and he receives reports of episodes of rapid heart rate from it.  His family history includes blood clots affecting his father and other relatives. He has not been tested for genetic blood coagulation disorders.  He has osteoporosis and is on Fosamax . He has experienced fractures from minor incidents, such as bending over, leading to a diagnosis of osteoporosis after a bone density test.  He has a history of prostate issues and underwent a procedure to alleviate urinary symptoms. He was previously on Flomax  but is not currently taking it.  He has a history of vitamin B12 deficiency, identified during a cardiology visit. He takes vitamin B12 tablets, but his levels remain on the lower end of normal. He reports issues with memory and concentration, which he attributes to the  deficiency.  He has experienced significant weight loss due to swallowing difficulties, which were addressed with an esophageal dilation procedure. He lost about 100 pounds during this period.  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 He has not smoked and does not take calcium  supplements regularly.    Discussed the use of AI scribe software for clinical note transcription with the patient, who gave verbal consent to proceed.  History of Present Illness   [name] is a 63 year old male with atrial fibrillation who presents with pain and numbness in his legs and arms.  He experiences severe pain and numbness in both legs and arms, with the left side being more affected. The pain in his left leg feels like a 'vice grip' from the calf to the thigh and has been ongoing for about three weeks. He has episodes of complete numbness in both legs, similar to the sensation of 'falling asleep'. Numbness in his arms has led to dropping objects. At times he thinks the numbness is worse on the left arm and LE but at other times the numbness is equal on both sides  He reports that he has an appointment with Neurology scheduled.    He is followed by Cardiology.  He has  HX CAD, nonischemic cardiomyopathy, HFmrEF, PSVT, syncope and mixed hyperlipidemia.  He has a history of atrial fibrillation and  is currently on Eliquis . He experiences rapid heart rates, sometimes exceeding 200 beats per minute, leading to episodes of syncope. He has an internal loop recorder that was recently replaced, and he receives reports of episodes of rapid heart rate from it.  He takes metoprolol  tartrate 25mg  BID but still has rapid heartbeats.  Echo 04/2023 EF 45 to 50%.  He takes Jardiance  10 mg daily and Entresto  24-26 twice a day, spironolactone  12.5 mg daily and metoprolol  tartrate 25 mg twice daily.  His family history includes blood clots affecting  his father and other relatives. He has not been tested for genetic blood coagulation disorders. He is on Eliquis  for episodic atrial fibrillation.    He has osteoporosis and is on Fosamax . He has experienced fractures from minor incidents, such as bending over and breaking two ribs, leading to a diagnosis of osteoporosis after a bone density test.  His BMD test 02/01/2024.  He is not taking vitamin D  and does not know his vitamin D  level.  Also, he is not taking calcium  supplements nor does he have a diet rich in calcium .    He has a history of prostate issues and underwent a procedure to alleviate urinary symptoms, a TURP.  He was previously on Flomax  but is not currently taking it.  He has a history of vitamin B12 deficiency, identified during a cardiology visit. His B12 level was less that 150.  He takes vitamin B12 tablets, but his levels remain on the lower end of normal. He reports issues with memory and concentration, which he attributes to the deficiency.  He has experienced significant weight loss due to swallowing difficulties, which were addressed with an esophageal dilation procedure. He lost about 100 pounds during this period.  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 He has not smoked and does not drink or use drugs.    Outpatient Encounter Medications as of 01/02/2024  Medication Sig   acetaminophen  (TYLENOL ) 500 MG tablet Take 2 tablets (1,000 mg total) by mouth every 6 (six) hours as needed for mild pain (pain score 1-3).   alendronate  (FOSAMAX ) 70 MG tablet Take 1 tablet (70 mg total) by mouth once a week. Take with a full glass of water on an empty stomach.   apixaban  (ELIQUIS ) 5 MG TABS tablet Take 1 tablet (5 mg total) by mouth 2 (two) times daily.   atorvastatin  (LIPITOR) 40 MG tablet Take 1 tablet (40 mg total) by mouth daily. Please keep upcoming appointment for future refills. Thank you.    cyanocobalamin  (VITAMIN B12) 1000 MCG tablet Take 1 tablet (1,000 mcg total) by mouth daily.   cyclobenzaprine  (FLEXERIL ) 5 MG tablet Take 1 tablet (5 mg total) by mouth at bedtime. (Patient taking differently: Take 5 mg by mouth at bedtime. Takes as needed)   famotidine  (PEPCID ) 20 MG tablet TAKE 1 TABLET BY MOUTH 1 HOUR BEFORE BEDTIME   gabapentin  (NEURONTIN ) 300 MG capsule Take 1 capsule (300 mg total) by mouth at bedtime.   JARDIANCE  10 MG TABS tablet TAKE 1 TABLET BY MOUTH ONCE EVERY MORNING WITH BREAKFAST   metoprolol  tartrate (LOPRESSOR ) 25 MG tablet Take 1 tablet (25 mg total) by mouth 2 (two) times daily.   omeprazole  (PRILOSEC) 20 MG capsule TAKE 1 CAPSULE BY MOUTH TWICE DAILY BEFORE A MEAL   sacubitril-valsartan (ENTRESTO ) 24-26 MG Take 1 tablet by mouth 2 (two) times daily.   spironolactone  (ALDACTONE ) 25  MG tablet Take 0.5 tablets (12.5 mg total) by mouth daily. KEEP OV.   No facility-administered encounter medications on file as of 01/02/2024.    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 20% mLAD - med mgmt; b.) MV 10/07/2020: no ischemia; c.) cCTA 11/16/2020: Ca2+ 108 (66% ile); 25% pLAD, < 25% dRCA   DOE (dyspnea on exertion)    Esophageal dysphagia    Family history of blood clots    a.) hypercoagulable work-up 09/2021 was negative; PT gene mutation, Factor V leiden mutation, Protein C, S, and AT III all normal/negative   Fatigue due to sleep pattern disturbance 10/16/2013   GERD (gastroesophageal reflux disease) 2023   Heart failure with mid-range ejection fraction (HFmEF) (HCC) 09/29/2020    a.) TTE 09/29/2020: EF 45-50%, sev bas-mid inferior HK, GLS -15.5%, mild MR, G1DD; b.) TTE 08/06/2021: EF 45-50%, no RWMAs, GLS -16.5%, mild MR, G2DD   History of 2019 novel coronavirus disease (COVID-19) 01/2020   Hypertension 1976   only on meds for a year   Implantable loop recorder 12/09/2013   a.) Med[employer]onic ILR device   Long term current use of aspirin     Mixed hyperlipidemia    Multiple lipomas    NICM (nonischemic cardiomyopathy) (HCC)    a.) TTE 10/08/2014: EF 50-55%; b.) TTE 09/29/2020: EF 45-50%; c.) TTE 08/06/2021: EF 45-50%   Nonsustained ventricular tachycardia (HCC) 05/2014   a.) noted on ILR in 05/2014; b.) holter 10/2021: NSVT x 2 episodes; longest lasted 6 beats at a rate of 197 bpm   Osteoporosis    a.) history of associated rib fracture; b.) on oral bisphosphonate therapy   Palpitations (PVCs)    a.) Zio 10/25/2021: occasional SVE (15,761, 1.0%), SVE couplets (349, <1.0%), and SVE triplets (6. <1.0%)   Pneumonia due to COVID-19 virus 01/2020   a.) hospitalized and Tx'd with MAB infusions   PSVT (paroxysmal supraventricular tachycardia) 10/2021   a.) holter 10/2021: 148 SVT episodes with max HR 226 bpm   Seasonal allergies    Sinus tachycardia    Stricture and stenosis of esophagus    Syncope 10/16/2013    Past Surgical History:  Procedure Laterality Date   COLONOSCOPY WITH PROPOFOL  N/A 11/24/2020   Procedure: COLONOSCOPY WITH PROPOFOL ;  Surgeon: Jinny Carmine, MD;  Location: [facility] ENDOSCOPY;  Service: Endoscopy;  Laterality: N/A;   COLONOSCOPY WITH PROPOFOL  N/A 01/18/2023   Procedure: COLONOSCOPY WITH PROPOFOL ;  Surgeon: Jinny Carmine, MD;  Location: [facility] ENDOSCOPY;  Service: Endoscopy;  Laterality: N/A;   ESOPHAGEAL DILATION  11/29/2022   Procedure: ESOPHAGEAL DILATION;  Surgeon: Jinny Carmine, MD;  Location: [facility] ENDOSCOPY;  Service: Endoscopy;;   ESOPHAGOGASTRODUODENOSCOPY (EGD) WITH PROPOFOL  N/A 11/29/2022   Procedure: ESOPHAGOGASTRODUODENOSCOPY (EGD) WITH  PROPOFOL ;  Surgeon: Jinny Carmine, MD;  Location: [facility] ENDOSCOPY;  Service: Endoscopy;  Laterality: N/A;   LEFT HEART CATHETERIZATION WITH CORONARY ANGIOGRAM N/A 10/17/2013   Procedure: LEFT HEART CATHETERIZATION WITH CORONARY ANGIOGRAM;  Surgeon: Lonni JONETTA Cash, MD;  Location: [facility] CATH LAB;  Service: Cardiovascular;  Laterality: N/A;   LIPOMA EXCISION Right  03/22/2023   Procedure: EXCISION LIPOMA multiple, right thigh lipomas x 3, right forearm x 1;  Surgeon: Desiderio Schanz, MD;  Location: [facility] ORS;  Service: General;  Laterality: Right;   LOOP RECORDER IMPLANT N/A 12/09/2013   Procedure: LOOP RECORDER IMPLANT;  Surgeon: Elspeth JAYSON Sage, MD;  Location: [facility] CATH LAB;  Service: Cardiovascular;  Laterality: N/A;   TRANSURETHRAL RESECTION OF PROSTATE N/A 01/10/2022   Procedure: TRANSURETHRAL RESECTION OF THE PROSTATE (TURP);  Surgeon: Penne Knee, MD;  Location: [facility] ORS;  Service: Urology;  Laterality: N/A;    Family History  Problem Relation Age of Onset   Hypertension Mother    Arrhythmia Mother    Heart disease Mother    Parkinson's disease Mother    Alzheimer's disease Mother    Deep vein thrombosis Father    Colon cancer Father    Cancer Father    Early death Father    Sudden death Maternal Grandfather 71   ADD / ADHD Paternal Aunt    Deep vein thrombosis Paternal Aunt    Deep vein thrombosis Paternal Uncle    Cancer Paternal Uncle    ADD / ADHD Brother     Social History   Socioeconomic History   Marital status: Significant Other    Spouse name: JUDY   Number of children: Not on file   Years of education: Not on file   Highest education level: Bachelor's degree (e.g., BA, AB, BS)  Occupational History   Not on file  Tobacco Use   Smoking status: Never    Passive exposure: Never   Smokeless tobacco: Never  Vaping Use   Vaping status: Never Used  Substance and Sexual Activity   Alcohol use: Never   Drug use: No   Sexual activity: Yes    Birth  control/protection: None  Other Topics Concern   Not on file  Social History Narrative   Not on file   Social Drivers of Health   Financial Resource Strain: High Risk (12/30/2023)   Overall Financial Resource Strain (CARDIA)    Difficulty of Paying Living Expenses: Hard  Food Insecurity: Food Insecurity Present (12/30/2023)   Hunger Vital Sign    Worried About Running Out of Food in the Last Year: Often true    Ran Out of Food in the Last Year: Often true  Transportation Needs: No Transportation Needs (12/30/2023)   PRAPARE - Administrator, Civil Service (Medical): No    Lack of Transportation (Non-Medical): No  Physical Activity: Sufficiently Active (12/30/2023)   Exercise Vital Sign    Days of Exercise per Week: 5 days    Minutes of Exercise per Session: 120 min  Stress: Stress Concern Present (12/30/2023)   [employer] of Occupational Health - Occupational Stress Questionnaire    Feeling of Stress: Rather much  Social Connections: Moderately Isolated (12/30/2023)   Social Connection and Isolation Panel    Frequency of Communication with Friends and Family: More than three times a week    Frequency of Social Gatherings with Friends and Family: Patient declined    Attends Religious Services: 1 to 4 times per year    Active Member of [employer] or Organizations: No    Attends Banker Meetings: Not on file    Marital Status: Widowed  Intimate Partner Violence: Not At Risk (02/08/2023)   Humiliation, Afraid, Rape, and Kick questionnaire    Fear of Current or Ex-Partner: No    Emotionally Abused: No    Physically Abused:  No    Sexually Abused: No    ROS      Objective   BP 130/84 (BP Location: Left Arm, Patient Position: Sitting, Cuff Size: Normal)   Pulse 69   Temp 97.7 F (36.5 C) (Oral)   Resp 18   Ht 6' 3 (1.905 m)   Wt 166 lb (75.3 kg)   SpO2 98%   BMI 20.75 kg/m    Physical Exam Vitals and nursing note reviewed.  Constitutional:       Appearance: Normal appearance.  HENT:     Head: Normocephalic and atraumatic.  Eyes:     Conjunctiva/sclera: Conjunctivae normal.  Cardiovascular:     Rate and Rhythm: Normal rate and regular rhythm.  Pulmonary:     Effort: Pulmonary effort is normal.     Breath sounds: Normal breath sounds.  Musculoskeletal:     Right lower leg: No edema.     Left lower leg: No edema.  Skin:    General: Skin is warm and dry.  Neurological:     Mental Status: He is alert and oriented to person, place, and time.  Psychiatric:        Mood and Affect: Mood normal.        Behavior: Behavior normal.        Thought Content: Thought content normal.        Judgment: Judgment normal.            The 10-year ASCVD risk score (Arnett DK, et al., 2019) is: 10.9%     Assessment & Plan:  B12 deficiency -     Vitamin B12  Vitamin D  deficiency Assessment & Plan: Is not taking any vitamin D  and is on Fosamax .  Needs vitamin D  for Fosamax  to work correctly.  Will check his vitamin D  level today.  Orders: -     VITAMIN D  25 Hydroxy (Vit-D Deficiency, Fractures)  Age-related osteoporosis with current pathological fracture, sequela Assessment & Plan: Has been on Fosamax  about 2 years but has not been taking vitamin D  or calcium  supplements.  Will check vitamin D  level today.  Encouraged him to take calcium  supplements 600 mg twice a day.  He had a DEXA scan 01/31/2022.  Needs it repeated after 02/01/2024.  Orders: -     DG Bone Density; Future  Other polyneuropathy Assessment & Plan: Trial of gabapentin  300 mg nightly to see if it stops some of the pain in his arms and legs.  He has a referral to neurology.  Orders: -     Gabapentin ; Take 1 capsule (300 mg total) by mouth at bedtime.  Dispense: 30 capsule; Refill: 3  Vitamin B12 deficiency Assessment & Plan: His B12 at diagnosis was less than 150.  Has been taking oral vitamin B12 at 1000 mcg daily.  Will check vitamin B12 level and see if  he is able to absorb enough B12 orally.   Chronic heart failure with mildly reduced ejection fraction (HFmrEF, 41-49%) (HCC) Assessment & Plan: Followed by cardiology and on Jardiance  10 mg daily plus Entresto  24-25 twice daily.     Return in about 3 months (around 04/02/2024).   Gennesis Hogland K Jakeim Sedore, MD

## 2024-01-03 ENCOUNTER — Ambulatory Visit: Payer: Self-pay | Admitting: Family Medicine

## 2024-01-03 LAB — VITAMIN B12: Vitamin B-12: 384 pg/mL (ref 232–1245)

## 2024-01-03 LAB — VITAMIN D 25 HYDROXY (VIT D DEFICIENCY, FRACTURES): Vit D, 25-Hydroxy: 32.5 ng/mL (ref 30.0–100.0)

## 2024-01-05 NOTE — Progress Notes (Signed)
 Remote Loop Recorder Transmission

## 2024-01-09 ENCOUNTER — Other Ambulatory Visit: Payer: Self-pay | Admitting: Internal Medicine

## 2024-01-17 ENCOUNTER — Encounter

## 2024-01-18 ENCOUNTER — Encounter

## 2024-01-19 ENCOUNTER — Ambulatory Visit (INDEPENDENT_AMBULATORY_CARE_PROVIDER_SITE_OTHER)

## 2024-01-19 DIAGNOSIS — R55 Syncope and collapse: Secondary | ICD-10-CM

## 2024-01-19 LAB — CUP PACEART REMOTE DEVICE CHECK
Date Time Interrogation Session: 20251009234836
Implantable Pulse Generator Implant Date: 20250506

## 2024-01-22 NOTE — Progress Notes (Signed)
 Remote Loop Recorder Transmission

## 2024-01-31 ENCOUNTER — Encounter: Payer: Self-pay | Admitting: Family Medicine

## 2024-02-03 ENCOUNTER — Ambulatory Visit: Payer: Self-pay | Admitting: Cardiology

## 2024-02-09 ENCOUNTER — Encounter: Admitting: Nurse Practitioner

## 2024-02-12 ENCOUNTER — Encounter: Admitting: Family Medicine

## 2024-02-15 ENCOUNTER — Ambulatory Visit: Payer: Self-pay | Admitting: *Deleted

## 2024-02-15 ENCOUNTER — Ambulatory Visit
Admission: RE | Admit: 2024-02-15 | Discharge: 2024-02-15 | Disposition: A | Discharge status: Home / Self Care | Source: Ambulatory Visit | Attending: Family Medicine | Admitting: Family Medicine

## 2024-02-15 DIAGNOSIS — M8000XS Age-related osteoporosis with current pathological fracture, unspecified site, sequela: Secondary | ICD-10-CM | POA: Diagnosis present

## 2024-02-15 DIAGNOSIS — Z13820 Encounter for screening for osteoporosis: Secondary | ICD-10-CM | POA: Diagnosis not present

## 2024-02-15 DIAGNOSIS — M810 Age-related osteoporosis without current pathological fracture: Secondary | ICD-10-CM | POA: Insufficient documentation

## 2024-02-16 ENCOUNTER — Ambulatory Visit: Admitting: Family Medicine

## 2024-02-16 ENCOUNTER — Encounter: Payer: Self-pay | Admitting: Family Medicine

## 2024-02-16 VITALS — BP 121/76 | HR 59 | Temp 97.4°F | Resp 18 | Ht 75.0 in | Wt 167.0 lb

## 2024-02-16 DIAGNOSIS — E559 Vitamin D deficiency, unspecified: Secondary | ICD-10-CM | POA: Diagnosis not present

## 2024-02-16 DIAGNOSIS — M810 Age-related osteoporosis without current pathological fracture: Secondary | ICD-10-CM

## 2024-02-16 DIAGNOSIS — G6289 Other specified polyneuropathies: Secondary | ICD-10-CM

## 2024-02-16 DIAGNOSIS — R6882 Decreased libido: Secondary | ICD-10-CM

## 2024-02-16 DIAGNOSIS — E538 Deficiency of other specified B group vitamins: Secondary | ICD-10-CM | POA: Diagnosis not present

## 2024-02-16 DIAGNOSIS — Z0000 Encounter for general adult medical examination without abnormal findings: Secondary | ICD-10-CM | POA: Insufficient documentation

## 2024-02-16 DIAGNOSIS — Z125 Encounter for screening for malignant neoplasm of prostate: Secondary | ICD-10-CM

## 2024-02-16 NOTE — Assessment & Plan Note (Signed)
 Father had colon cancer and he gets a colonoscopy about every 3 years.  Last colonoscopy 01/18/2023.

## 2024-02-16 NOTE — Assessment & Plan Note (Signed)
 He is taking a daily supplement for B12 and vitamin D .  Will check his B12 level and see if his oral supplementation is adequate.

## 2024-02-16 NOTE — Assessment & Plan Note (Signed)
 Comparison of his bone mineral density score this year compared to 2023 shows no improvement.  His lumbar spine and both hips are osteoporotic.  Will check thyroid  function, parathyroid hormone and testosterone levels as he has had no improvement in bone density.  If laboratory testing does not reveal a secondary cause will refer to endocrinology.

## 2024-02-16 NOTE — Assessment & Plan Note (Addendum)
 Has painful peripheral neuropathy starts at his left hip and goes down his left leg.  Reports that gabapentin  has helped some.  Has a hangover kind of feeling while taking gabapentin .  Reports he has an appointment with neurology concerning his peripheral pain.  He has not had a workup of his back.

## 2024-02-16 NOTE — Assessment & Plan Note (Signed)
 One uncle had prostate cancer.  Patient has already undergone TURP and LUTS is very low.

## 2024-02-16 NOTE — Assessment & Plan Note (Signed)
 Checking his testosterone level to rule out as a secondary cause of osteoporosis

## 2024-02-16 NOTE — Progress Notes (Addendum)
 Complete physical exam  Patient: [name]   DOB: [birth year]   63 y.o. Male  MRN: [id number]  Subjective:    Chief Complaint  Patient presents with   Annual Exam    [name] is a 63 y.o. male who presents today for a complete physical exam. He reports consuming a general diet.  He generally feels well. He reports sleeping well. He does not have additional problems to discuss today.   Reviewed his bone mineral density scan from 02/16/2024 and compared to 02/01/2022.  There has been no change in his lumbar spine with a T-score of -2.5 left femur neck T-score -2.6.  He has been taking vitamin D  and calcium .  His vitamin D  level was 32.5 on 01/02/2024.  He reports he does take calcium  twice a day.  His uncle had prostate cancer.  Discussed screening for prostate cancer and he is willing to do a PSA only.  The patient has had a TURP and reports that his urinary symptoms are very normal now.  He has nocturia once or twice at night depending on what he drinks.  Does not have to double void, denies dribbling and denies hesitancy.  Most recent fall risk assessment:    02/16/2024    3:29 PM  Fall Risk   Falls in the past year? 1  Number falls in past yr: 1  Injury with Fall? 0  Risk for fall due to : No Fall Risks  Follow up Falls evaluation completed     Most recent depression screenings:    02/16/2024    3:29 PM 01/02/2024    3:43 PM  PHQ 2/9 Scores  PHQ - 2 Score 2 2  PHQ- 9 Score 9 9      Data saved with a previous flowsheet row definition        Patient Care Team: Deloras Reichard, Devere POUR, MD as PCP - General (Family Medicine) End, Lonni, MD as PCP - Cardiology (Cardiology) Fernande Elspeth BROCKS, MD (Inactive) as PCP - Electrophysiology (Cardiology) Vivienne Lonni Ingle, NP as Nurse Practitioner (Cardiology) Riddle, Suzann, NP as Nurse Practitioner (Clinical Cardiac Electrophysiology)   Outpatient Medications Prior to Visit  Medication Sig   acetaminophen  (TYLENOL )  500 MG tablet Take 2 tablets (1,000 mg total) by mouth every 6 (six) hours as needed for mild pain (pain score 1-3).   alendronate  (FOSAMAX ) 70 MG tablet Take 1 tablet (70 mg total) by mouth once a week. Take with a full glass of water on an empty stomach.   apixaban  (ELIQUIS ) 5 MG TABS tablet Take 1 tablet (5 mg total) by mouth 2 (two) times daily.   atorvastatin  (LIPITOR) 40 MG tablet Take 1 tablet (40 mg total) by mouth daily. Please keep upcoming appointment for future refills. Thank you.   cyanocobalamin  (VITAMIN B12) 1000 MCG tablet Take 1 tablet (1,000 mcg total) by mouth daily.   cyclobenzaprine  (FLEXERIL ) 5 MG tablet Take 1 tablet (5 mg total) by mouth at bedtime. (Patient taking differently: Take 5 mg by mouth at bedtime. Takes as needed)   famotidine  (PEPCID ) 20 MG tablet TAKE 1 TABLET BY MOUTH 1 HOUR BEFORE BEDTIME   gabapentin  (NEURONTIN ) 300 MG capsule Take 1 capsule (300 mg total) by mouth at bedtime.   JARDIANCE  10 MG TABS tablet Take 1 tablet (10 mg total) by mouth daily with breakfast.   metoprolol  tartrate (LOPRESSOR ) 25 MG tablet Take 1 tablet (25 mg total) by mouth 2 (two) times daily.   omeprazole  (  PRILOSEC) 20 MG capsule TAKE 1 CAPSULE BY MOUTH TWICE DAILY BEFORE A MEAL   sacubitril-valsartan (ENTRESTO ) 24-26 MG Take 1 tablet by mouth 2 (two) times daily.   spironolactone  (ALDACTONE ) 25 MG tablet Take 0.5 tablets (12.5 mg total) by mouth daily. KEEP OV.   No facility-administered medications prior to visit.         Objective:    BP 121/76 (BP Location: Left Arm, Patient Position: Sitting, Cuff Size: Normal)   Pulse (!) 59   Temp (!) 97.4 F (36.3 C) (Oral)   Resp 18   Ht 6' 3 (1.905 m)   Wt 167 lb (75.8 kg)   SpO2 98%   BMI 20.87 kg/m    Physical Exam Vitals and nursing note reviewed. Exam conducted with a chaperone present.  Constitutional:      Appearance: Normal appearance.  HENT:     Head: Normocephalic and atraumatic.     Right Ear: Tympanic  membrane, ear canal and external ear normal.     Left Ear: Tympanic membrane, ear canal and external ear normal.  Eyes:     Extraocular Movements: Extraocular movements intact.     Conjunctiva/sclera: Conjunctivae normal.  Neck:     Thyroid : No thyroid  mass, thyromegaly or thyroid  tenderness.  Cardiovascular:     Rate and Rhythm: Normal rate and regular rhythm.  Pulmonary:     Effort: Pulmonary effort is normal.     Breath sounds: Normal breath sounds.  Abdominal:     General: Abdomen is flat. Bowel sounds are normal.     Palpations: Abdomen is soft.     Tenderness: There is no abdominal tenderness.     Hernia: There is no hernia in the left inguinal area or right inguinal area.  Genitourinary:    Pubic Area: No rash.      Penis: Normal.      Testes: Normal.     Epididymis:     Right: Normal.     Left: Normal.  Musculoskeletal:     Cervical back: Full passive range of motion without pain.     Right lower leg: No edema.     Left lower leg: No edema.  Skin:    General: Skin is warm and dry.  Neurological:     Mental Status: He is alert and oriented to person, place, and time.  Psychiatric:        Mood and Affect: Mood normal.        Behavior: Behavior normal.        Thought Content: Thought content normal.        Judgment: Judgment normal.      No results found for any visits on 02/16/24.   The 10-year ASCVD risk score (Arnett DK, et al., 2019) is: 9.7%     Assessment & Plan:    Routine Health Maintenance and Physical Exam  Health Maintenance  Topic Date Due   Pneumococcal Vaccine for age over 107 (1 of 2 - PCV) Never done   Flu Shot  11/10/2023   COVID-19 Vaccine (1 - 2025-26 season) 12/11/2023   Colon Cancer Screening  01/17/2033   DTaP/Tdap/Td vaccine (3 - Td or Tdap) 03/24/2033   Hepatitis C Screening  Completed   HIV Screening  Completed   Zoster (Shingles) Vaccine  Completed   Hepatitis B Vaccine  Aged Out   Meningitis B Vaccine  Aged Out     Discussed health benefits of physical activity, and encouraged him to engage in regular exercise  appropriate for his age and condition.  Osteoporosis without current pathological fracture, unspecified osteoporosis type Assessment & Plan: Comparison of his bone mineral density score this year compared to 2023 shows no improvement.  His lumbar spine and both hips are osteoporotic.  Will check thyroid  function, parathyroid hormone and testosterone levels as he has had no improvement in bone density.  If laboratory testing does not reveal a secondary cause will refer to endocrinology.    Orders: -     TSH + free T4; Future -     Parathyroid hormone, intact (no Ca); Future  Vitamin B12 deficiency Assessment & Plan: He is taking a daily supplement for B12 and vitamin D .  Will check his B12 level and see if his oral supplementation is adequate.  Orders: -     Vitamin B12; Future  Low libido Assessment & Plan: Checking his testosterone level to rule out as a secondary cause of osteoporosis  Orders: -     Testosterone,Free and Total; Future  Vitamin D  deficiency  Screening for prostate cancer Assessment & Plan: One uncle had prostate cancer.  Patient has already undergone TURP and LUTS is very low.   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  Reports that gabapentin  has helped some.  Has a hangover kind of feeling while taking gabapentin .  Reports he has an appointment with neurology concerning his peripheral pain.  He has not had a workup of his back.   Wellness examination Assessment & Plan: Father had colon cancer and he gets a colonoscopy about every 3 years.  Last colonoscopy 01/18/2023.  Orders: -     Lipid panel; Future    Return in about 4 weeks (around 03/15/2024).     Brent Surrette K Admiral Marcucci, MD

## 2024-02-17 ENCOUNTER — Ambulatory Visit: Payer: Self-pay | Admitting: Cardiology

## 2024-02-19 ENCOUNTER — Ambulatory Visit

## 2024-02-19 ENCOUNTER — Encounter

## 2024-02-19 DIAGNOSIS — R55 Syncope and collapse: Secondary | ICD-10-CM

## 2024-02-19 LAB — CUP PACEART REMOTE DEVICE CHECK
Date Time Interrogation Session: 20251109234800
Implantable Pulse Generator Implant Date: 20250506

## 2024-02-20 LAB — CUP PACEART REMOTE DEVICE CHECK
Date Time Interrogation Session: 20250807102212
Implantable Pulse Generator Implant Date: 20250506

## 2024-02-21 ENCOUNTER — Ambulatory Visit

## 2024-02-21 DIAGNOSIS — M810 Age-related osteoporosis without current pathological fracture: Secondary | ICD-10-CM

## 2024-02-21 DIAGNOSIS — Z0000 Encounter for general adult medical examination without abnormal findings: Secondary | ICD-10-CM

## 2024-02-21 DIAGNOSIS — E538 Deficiency of other specified B group vitamins: Secondary | ICD-10-CM

## 2024-02-21 DIAGNOSIS — R6882 Decreased libido: Secondary | ICD-10-CM | POA: Diagnosis not present

## 2024-02-22 LAB — TSH+FREE T4
Free T4: 1.04 ng/dL (ref 0.82–1.77)
TSH: 1.47 u[IU]/mL (ref 0.450–4.500)

## 2024-02-22 LAB — LIPID PANEL
Chol/HDL Ratio: 3.3 ratio (ref 0.0–5.0)
Cholesterol, Total: 174 mg/dL (ref 100–199)
HDL: 52 mg/dL (ref >39)
LDL Chol Calc (NIH): 111 mg/dL — ABNORMAL HIGH (ref 0–99)
Triglycerides: 57 mg/dL (ref 0–149)
VLDL Cholesterol Cal: 11 mg/dL (ref 5–40)

## 2024-02-22 LAB — VITAMIN B12: Vitamin B-12: 510 pg/mL (ref 232–1245)

## 2024-02-22 LAB — TESTOSTERONE,FREE AND TOTAL
Testosterone, Free: 5.4 pg/mL — ABNORMAL LOW (ref 6.6–18.1)
Testosterone: 820 ng/dL (ref 264–916)

## 2024-02-22 LAB — PARATHYROID HORMONE, INTACT (NO CA): PTH: 47 pg/mL (ref 15–65)

## 2024-02-23 NOTE — Progress Notes (Signed)
 Remote Loop Recorder Transmission

## 2024-02-25 ENCOUNTER — Ambulatory Visit: Payer: Self-pay | Admitting: Cardiology

## 2024-02-25 ENCOUNTER — Ambulatory Visit: Payer: Self-pay | Admitting: Family Medicine

## 2024-02-26 NOTE — Progress Notes (Signed)
 Patient seen by Dr. Kennyth on 08/15/23. Will defer back to MD since this transmission is > 5 months old.

## 2024-03-04 ENCOUNTER — Other Ambulatory Visit: Payer: Self-pay | Admitting: Physician Assistant

## 2024-03-04 DIAGNOSIS — I5022 Chronic systolic (congestive) heart failure: Secondary | ICD-10-CM

## 2024-03-04 DIAGNOSIS — E538 Deficiency of other specified B group vitamins: Secondary | ICD-10-CM

## 2024-03-04 DIAGNOSIS — I2510 Atherosclerotic heart disease of native coronary artery without angina pectoris: Secondary | ICD-10-CM

## 2024-03-08 ENCOUNTER — Other Ambulatory Visit: Payer: Self-pay | Admitting: Nurse Practitioner

## 2024-03-11 MED ORDER — ATORVASTATIN CALCIUM 40 MG PO TABS: 40.0000 mg | ORAL_TABLET | Freq: Every day | ORAL | 3 refills | Status: AC

## 2024-03-12 NOTE — Telephone Encounter (Signed)
 Requested medication (s) are due for refill today: yes  Requested medication (s) are on the active medication list: yes  Last refill:  08/07/23  Future visit scheduled: yes  Notes to clinic:  Unable to refill per protocol, cannot delegate.      Requested Prescriptions  Pending Prescriptions Disp Refills   cyclobenzaprine  (FLEXERIL ) 5 MG tablet [Pharmacy Med Name: CYCLOBENZAPRINE  HCL 5 MG TAB] 30 tablet 1    Sig: TAKE 1 TABLET BY MOUTH AT BEDTIME MUSCLESPASMS     Not Delegated - Analgesics:  Muscle Relaxants Failed - 03/12/2024  3:55 PM      Failed - This refill cannot be delegated      Failed - Valid encounter within last 6 months    Recent Outpatient Visits           7 months ago Chronic heart failure with mildly reduced ejection fraction (HFmrEF, 41-49%) [facility])   [facility] Melvin Pao, NP

## 2024-03-21 ENCOUNTER — Ambulatory Visit

## 2024-03-21 ENCOUNTER — Encounter

## 2024-03-21 DIAGNOSIS — I471 Supraventricular tachycardia, unspecified: Secondary | ICD-10-CM | POA: Diagnosis not present

## 2024-03-21 LAB — CUP PACEART REMOTE DEVICE CHECK
Date Time Interrogation Session: 20251210234814
Implantable Pulse Generator Implant Date: 20250506

## 2024-03-28 NOTE — Progress Notes (Signed)
 Remote Loop Recorder Transmission

## 2024-03-29 ENCOUNTER — Ambulatory Visit: Payer: Self-pay | Admitting: Cardiology

## 2024-04-16 ENCOUNTER — Other Ambulatory Visit: Payer: Self-pay | Admitting: Neurology

## 2024-04-16 DIAGNOSIS — M79605 Pain in left leg: Secondary | ICD-10-CM

## 2024-04-17 ENCOUNTER — Other Ambulatory Visit: Payer: Self-pay | Admitting: Neurology

## 2024-04-17 DIAGNOSIS — R519 Headache, unspecified: Secondary | ICD-10-CM

## 2024-04-17 DIAGNOSIS — R200 Anesthesia of skin: Secondary | ICD-10-CM

## 2024-04-17 DIAGNOSIS — R2990 Unspecified symptoms and signs involving the nervous system: Secondary | ICD-10-CM

## 2024-04-18 ENCOUNTER — Encounter: Payer: Self-pay | Admitting: Cardiology

## 2024-04-18 ENCOUNTER — Ambulatory Visit: Attending: Cardiology | Admitting: Cardiology

## 2024-04-18 VITALS — BP 100/70 | HR 65 | Ht 75.0 in | Wt 166.6 lb

## 2024-04-18 DIAGNOSIS — I471 Supraventricular tachycardia, unspecified: Secondary | ICD-10-CM | POA: Diagnosis not present

## 2024-04-18 DIAGNOSIS — Z95818 Presence of other cardiac implants and grafts: Secondary | ICD-10-CM | POA: Diagnosis not present

## 2024-04-18 DIAGNOSIS — I5022 Chronic systolic (congestive) heart failure: Secondary | ICD-10-CM | POA: Diagnosis not present

## 2024-04-18 DIAGNOSIS — R55 Syncope and collapse: Secondary | ICD-10-CM | POA: Diagnosis not present

## 2024-04-18 DIAGNOSIS — I480 Paroxysmal atrial fibrillation: Secondary | ICD-10-CM

## 2024-04-18 NOTE — Progress Notes (Signed)
 "     Electrophysiology Clinic Note    Date:  04/18/2024  Patient ID:  [name], [name] 1961/01/25, MRN [id number] PCP:  Ziglar, Susan K, MD  Cardiologist:  Lonni Hanson, MD  Cardiology APP:  Vivienne Lonni Ingle, NP  Electrophysiologist:  Fonda Kitty, MD  Electrophysiology APP:  Maribel Hadley, NP     Discussed the use of AI scribe software for clinical note transcription with the patient, who gave verbal consent to proceed.   Patient Profile    Chief Complaint: Afib, ILR follow-up  History of Present Illness: [name] is a 64 y.o. male with PMH notable for syncope, parox Afib, atypical chest pain, NICM, pSVT, HFmrEF, HTN, unexplained weight loss ; seen today for Fonda Kitty, MD for routine electrophysiology followup.   Had ILR implanted and abandoned many years ago. He prsented to ER 04/2023 for syncopal episode, EKG in ER showed AFib w RVR. He continued to have presyncopal events and so ILR was explanted and reimplanted 08/2023 by Dr. Kitty.   He had episode of syncope yesterday morning without warning. He says that he got up and was getting ready for work, taking care of dog and passed out in living room. His GF heard him fall. Today he is very sore in back and arms. He did not hit head. He did not hit symptom activator. This was the first time he has syncopized since Jan 2025.   He denies dizziness, LH, presyncope. No chest pain, chest pressure. He continues to take eliquis  BID without bleeding concerns. He is not aware of any Afib episodes recently.       Arrhythmia/Device History MDT ILR implanted 08/2023  MDT ILR implanted 2018 (explanted)     ROS:  Please see the history of present illness. All other systems are reviewed and otherwise negative.    Physical Exam    VS:  BP 100/70 (BP Location: Left Arm, Patient Position: Sitting, Cuff Size: Normal)   Pulse 65   Ht 6' 3 (1.905 m)   Wt 166 lb 9.6 oz (75.6 kg)   SpO2 98%   BMI 20.82 kg/m  BMI: Body  mass index is 20.82 kg/m.  Orthostatic VS for the past 24 hrs (Last 3 readings):  BP- Lying Pulse- Lying BP- Sitting Pulse- Sitting BP- Standing at 0 minutes Pulse- Standing at 0 minutes BP- Standing at 3 minutes Pulse- Standing at 3 minutes  04/18/24 1510 97/66 55 122/71 57 114/71 59 126/78 61          Wt Readings from Last 3 Encounters:  04/18/24 166 lb 9.6 oz (75.6 kg)  02/16/24 167 lb (75.8 kg)  01/02/24 166 lb (75.3 kg)     GEN- The patient is thin, alert and oriented x 3 today.  Poor dentition  Lungs- Clear to ausculation bilaterally, normal work of breathing.  Heart- Regular rate and rhythm, no murmurs, rubs or gallops Extremities- No peripheral edema, warm, dry   04/18/2024 remote ILR transmission reviewed -  No recent AF episodes Several AF on 8/2, none since No pauses or brady events   Studies Reviewed   Previous EP, cardiology notes.    EKG is not ordered. Personal review of EKG from 12/14/2023 shows:  SR at 72, LAD        Long term monitor, 07/04/2023 HR 47 - 194, average 68 bpm. 1 NSVT lasting 5 beats. 48 nonsustained SVT, longest 21.5 seconds. Rare supraventricular and ventricular ectopy. No sustained arrhythmias. No atrial fibrillation.  Cardiac MRI, 06/07/2023 1.  Normal LV size and systolic function.  LVEF 55%.  2.  No LGE or scar  3.  No evidence for infiltrative or inflammatory disease  4.  Moderately dilated RV size, normal RV function.   TTE, 04/14/2023  1. Left ventricular ejection fraction, by estimation, is 45 to 50%. The left ventricle has mildly decreased function. The left ventricle demonstrates global hypokinesis. Left ventricular diastolic parameters were normal. The average left ventricular global longitudinal strain is -20.5 %. The global longitudinal strain is normal.   2. Right ventricular systolic function is normal. The right ventricular size is normal.   3. The mitral valve is normal in structure. Trivial mitral valve regurgitation.    4. The aortic valve is tricuspid. Aortic valve regurgitation is not visualized.   5. The inferior vena cava is normal in size with greater than 50% respiratory variability, suggesting right atrial pressure of 3 mmHg.      Long term monitor, 10/25/2021 Patient had a min HR of 50 bpm, max HR of 226 bpm, and avg HR of 76 bpm. Predominant underlying rhythm was Sinus Rhythm.    2 Ventricular Tachycardia runs occurred, the run with the fastest interval lasting 6 beats with a max rate of 197 bpm (avg 176 bpm);  the run with the fastest interval was also the longest.    148 Supraventricular Tachycardia runs occurred, the run with the fastest interval lasting 8.0 secs with a max rate of 226 bpm, the longest lasting 55.8 secs with an avg rate of 153 bpm. Isolated  SVEs were rare (<1.0%), SVE Couplets were rare (<1.0%), and SVE Triplets were rare (<1.0%). Isolated VEs were occasional (1.0%, 15761), VE Couplets were rare (<1.0%, 349), and VE Triplets were rare (<1.0%, 6). Ventricular Bigeminy and Trigeminy were  present.   Triggered events assoc with sinus   TTE, 08/06/2021  1. Left ventricular ejection fraction, by estimation, is 45 to 50%. The left ventricle has mildly decreased function. The left ventricle has no regional wall motion abnormalities. Left ventricular diastolic parameters are consistent with Grade II diastolic dysfunction (pseudonormalization). The average left ventricular global longitudinal strain is -16.5 %.  2. Right ventricular systolic function is normal. The right ventricular size is normal. Tricuspid regurgitation signal is inadequate for assessing PA pressure.  3. The mitral valve is normal in structure. Mild mitral valve regurgitation. No evidence of mitral stenosis.  4. The aortic valve is tricuspid. Aortic valve regurgitation is not visualized. No aortic stenosis is present.  5. There is borderline dilatation of the aortic root and of the ascending aorta, measuring 37 mm.  6. The  inferior vena cava is normal in size with greater than 50% respiratory variability, suggesting right atrial pressure of 3 mmHg.    Assessment and Plan     #) syncope #) ILR Episode yesterday of syncope without typical prodrome ILR without concerning arrhythmias, pauses, brady events Orthostatics negative in clinic We discussed that he should avoid driving for 6 months according to [facility] for unexplained syncope Encouraged to use symptom activator if has further syncope   #) parox AFib #) hypercoag d/t parox AFib Very low AFib burden, last episode in August 2025 Ventricular rates quite elevated during AFib Given syncope episode above, do not favor increasing BB Continue 25mg  lopressor  BID Continue 5mg  eliquis  BID. If has recurrent syncope/falls, will need to reassess risk/benefit of OAC  #) HFmrEF BP borderline in office, but asymptomatic Continue 10mg  jardiance , 25mg  lopressor  BID, 24-26 entresto   BID, 12.5mg  spiro daily Encouraged him to check BP 2-3 times per week and record measurements, bring BP log to follow-up appts       Current medicines are reviewed at length with the patient today.   The patient does not have concerns regarding his medicines.  The following changes were made today:  none  Labs/ tests ordered today include:  No orders of the defined types were placed in this encounter.    Disposition: Follow up with Dr. Kennyth or EP APP in 12 months  Follow-up with general cardiology in 2-3 months as previously recommended   Signed, Branndon Tuite, NP  04/18/2024  4:04 PM  Electrophysiology [facility] "

## 2024-04-18 NOTE — Patient Instructions (Addendum)
 Medication Instructions:  Your physician recommends that you continue on your current medications as directed. Please refer to the Current Medication list given to you today.   *If you need a refill on your cardiac medications before your next appointment, please call your pharmacy*  Lab Work: No labs ordered today  If you have labs (blood work) drawn today and your tests are completely normal, you will receive your results only by: MyChart Message (if you have MyChart) OR A paper copy in the mail If you have any lab test that is abnormal or we need to change your treatment, we will call you to review the results.  Testing/Procedures: No test ordered today   Follow-Up: At [facility], you and your health needs are our priority.  As part of our continuing mission to provide you with exceptional heart care, our providers are all part of one team.  This team includes your primary Cardiologist (physician) and Advanced Practice Providers or APPs (Physician Assistants and Nurse Practitioners) who all work together to provide you with the care you need, when you need it.  Your next appointment:   12 month(s)  Provider:   Suzann Riddle, NP or Dr. Kennyth    We recommend signing up for the patient portal called MyChart.  Sign up information is provided on this After Visit Summary.  MyChart is used to connect with patients for Virtual Visits (Telemedicine).  Patients are able to view lab/test results, encounter notes, upcoming appointments, etc.  Non-urgent messages can be sent to your provider as well.   To learn more about what you can do with MyChart, go to [email or web address].   Other Instructions Please monitor blood pressure 2-3 times weekly.  Due to your syncopal episode, you should not drive for 6 months.

## 2024-04-19 ENCOUNTER — Ambulatory Visit
Admission: RE | Admit: 2024-04-19 | Discharge: 2024-04-19 | Disposition: A | Source: Ambulatory Visit | Attending: Neurology | Admitting: Neurology

## 2024-04-19 DIAGNOSIS — M79605 Pain in left leg: Secondary | ICD-10-CM | POA: Diagnosis present

## 2024-04-21 ENCOUNTER — Ambulatory Visit

## 2024-04-21 DIAGNOSIS — R55 Syncope and collapse: Secondary | ICD-10-CM | POA: Diagnosis not present

## 2024-04-22 ENCOUNTER — Encounter

## 2024-04-22 LAB — CUP PACEART REMOTE DEVICE CHECK
Date Time Interrogation Session: 20260110233926
Implantable Pulse Generator Implant Date: 20250506

## 2024-04-23 NOTE — Progress Notes (Signed)
 Remote Loop Recorder Transmission

## 2024-04-24 ENCOUNTER — Encounter: Payer: Self-pay | Admitting: Emergency Medicine

## 2024-04-24 ENCOUNTER — Emergency Department

## 2024-04-24 ENCOUNTER — Other Ambulatory Visit: Payer: Self-pay

## 2024-04-24 ENCOUNTER — Emergency Department
Admission: EM | Admit: 2024-04-24 | Discharge: 2024-04-24 | Disposition: A | Discharge status: Home / Self Care | Attending: Emergency Medicine | Admitting: Emergency Medicine

## 2024-04-24 DIAGNOSIS — I2510 Atherosclerotic heart disease of native coronary artery without angina pectoris: Secondary | ICD-10-CM | POA: Diagnosis not present

## 2024-04-24 DIAGNOSIS — I509 Heart failure, unspecified: Secondary | ICD-10-CM | POA: Diagnosis not present

## 2024-04-24 DIAGNOSIS — R519 Headache, unspecified: Secondary | ICD-10-CM | POA: Insufficient documentation

## 2024-04-24 DIAGNOSIS — K047 Periapical abscess without sinus: Secondary | ICD-10-CM | POA: Insufficient documentation

## 2024-04-24 DIAGNOSIS — Z7901 Long term (current) use of anticoagulants: Secondary | ICD-10-CM | POA: Insufficient documentation

## 2024-04-24 DIAGNOSIS — R220 Localized swelling, mass and lump, head: Secondary | ICD-10-CM | POA: Diagnosis present

## 2024-04-24 DIAGNOSIS — I4891 Unspecified atrial fibrillation: Secondary | ICD-10-CM | POA: Insufficient documentation

## 2024-04-24 LAB — BASIC METABOLIC PANEL WITH GFR
Anion gap: 10 (ref 5–15)
BUN: 17 mg/dL (ref 8–23)
CO2: 23 mmol/L (ref 22–32)
Calcium: 9.3 mg/dL (ref 8.9–10.3)
Chloride: 103 mmol/L (ref 98–111)
Creatinine, Ser: 0.67 mg/dL (ref 0.61–1.24)
GFR, Estimated: >60 mL/min
Glucose, Bld: 101 mg/dL — ABNORMAL HIGH (ref 70–99)
Potassium: 4 mmol/L (ref 3.5–5.1)
Sodium: 136 mmol/L (ref 135–145)

## 2024-04-24 LAB — CBC
HCT: 46 % (ref 39.0–52.0)
Hemoglobin: 15.5 g/dL (ref 13.0–17.0)
MCH: 29.8 pg (ref 26.0–34.0)
MCHC: 33.7 g/dL (ref 30.0–36.0)
MCV: 88.5 fL (ref 80.0–100.0)
Platelets: 235 K/uL (ref 150–400)
RBC: 5.2 MIL/uL (ref 4.22–5.81)
RDW: 12.6 % (ref 11.5–15.5)
WBC: 8.5 K/uL (ref 4.0–10.5)
nRBC: 0 % (ref 0.0–0.2)

## 2024-04-24 LAB — PROTIME-INR
INR: 1.1 (ref 0.8–1.2)
Prothrombin Time: 14.4 s (ref 11.4–15.2)

## 2024-04-24 MED ORDER — CLINDAMYCIN HCL 150 MG PO CAPS: 450.0000 mg | ORAL_CAPSULE | Freq: Three times a day (TID) | ORAL | 0 refills | Status: AC

## 2024-04-24 MED ORDER — DIPHENHYDRAMINE HCL 50 MG/ML IJ SOLN
25.0000 mg | Freq: Once | INTRAMUSCULAR | Status: AC
  Administered 2024-04-24: 25 mg via INTRAVENOUS
  Filled 2024-04-24: qty 1

## 2024-04-24 MED ORDER — PROCHLORPERAZINE EDISYLATE 10 MG/2ML IJ SOLN
10.0000 mg | Freq: Once | INTRAMUSCULAR | Status: AC
  Administered 2024-04-24: 10 mg via INTRAVENOUS
  Filled 2024-04-24: qty 2

## 2024-04-24 MED ORDER — PROCHLORPERAZINE MALEATE 5 MG PO TABS: 5.0000 mg | ORAL_TABLET | Freq: Four times a day (QID) | ORAL | 0 refills | Status: AC | PRN

## 2024-04-24 NOTE — ED Triage Notes (Signed)
 Pt arrives via EMS from work where pts boss thought his face was not symmetric and worried about stroke. Pt reports left jaw has been swelling for 3 days due to bad teeth and headache. Also having left sided flank pain. Pt saw cardiology last week for a syncopal episode.

## 2024-04-24 NOTE — ED Provider Notes (Signed)
 "  [facility] Provider Note    Event Date/Time   First MD Initiated Contact with Patient 04/24/24 1530     (approximate)   History   Chief Complaint Headache and Flank Pain   HPI  [name] is a 64 y.o. male with past medical history of hyperlipidemia, CAD, CHF, atrial fibrillation on Eliquis , and SVT who presents to the ED complaining of headache.  Patient reports that he has had gradually worsening left-sided headache for the past week, denies any associated fevers or neck stiffness.  He does state that he has been dealing with multiple months of numbness, pain, and weakness in his left leg.  He has been following with neurology for this problem, has had outpatient MRI and EMG ordered and previously had ultrasound that was negative for DVT.  His coworker became concerned today when he felt like patient's left side of his face looked different than the right.  Patient does state that he has had 1 week of increasing pain around his left jaw as well as some swelling around that side of his face.  He reports issues with multiple teeth in this location, was previously following with a dentist but has not seen them in some time.     Physical Exam   Triage Vital Signs: ED Triage Vitals  Encounter Vitals Group     BP 04/24/24 1259 107/81     Girls Systolic BP Percentile --      Girls Diastolic BP Percentile --      Boys Systolic BP Percentile --      Boys Diastolic BP Percentile --      Pulse Rate 04/24/24 1259 88     Resp 04/24/24 1259 16     Temp 04/24/24 1259 98 F (36.7 C)     Temp Source 04/24/24 1259 Oral     SpO2 04/24/24 1259 100 %     Weight 04/24/24 1301 166 lb 7.2 oz (75.5 kg)     Height 04/24/24 1301 6' 3 (1.905 m)     Head Circumference --      Peak Flow --      Pain Score 04/24/24 1301 10     Pain Loc --      Pain Education --      Exclude from Growth Chart --     Most recent vital signs: Vitals:   04/24/24 1259 04/24/24 1523  BP:  107/81   Pulse: 88   Resp: 16   Temp: 98 F (36.7 C)   SpO2: 100% 100%    Constitutional: Alert and oriented. Eyes: Conjunctivae are normal. Head: Atraumatic. Nose: No congestion/rhinnorhea. Mouth/Throat: Mucous membranes are moist.  Left facial edema noted with poor dentition and tenderness along the left lower gumline, no fluctuance noted. Cardiovascular: Normal rate, regular rhythm. Grossly normal heart sounds.  2+ radial and DP pulses bilaterally. Respiratory: Normal respiratory effort.  No retractions. Lungs CTAB. Gastrointestinal: Soft and nontender. No distention. Musculoskeletal: No lower extremity tenderness nor edema.  Neurologic:  Normal speech and language.  Left facial edema noted without evidence of facial droop.  No gross focal neurologic deficits are appreciated.    ED Results / Procedures / Treatments   Labs (all labs ordered are listed, but only abnormal results are displayed) Labs Reviewed  BASIC METABOLIC PANEL WITH GFR - Abnormal; Notable for the following components:      Result Value   Glucose, Bld 101 (*)    All other components within  normal limits  CBC  PROTIME-INR    RADIOLOGY CT head reviewed and interpreted by me with no hemorrhage or midline shift.  PROCEDURES:  Critical Care performed: No  Procedures   MEDICATIONS ORDERED IN ED: Medications  prochlorperazine  (COMPAZINE ) injection 10 mg (10 mg Intravenous Given 04/24/24 1608)  diphenhydrAMINE  (BENADRYL ) injection 25 mg (25 mg Intravenous Given 04/24/24 1609)     IMPRESSION / MDM / ASSESSMENT AND PLAN / ED COURSE  I reviewed the triage vital signs and the nursing notes.                              64 y.o. male with past medical history of hyperlipidemia, CAD, CHF, atrial fibrillation, and SVT who presents to the ED complaining of 2 weeks of increasing headache as well as left lower dental pain and facial swelling, concern from coworker for facial droop today.  Patient's  presentation is most consistent with acute presentation with potential threat to life or bodily function.  Differential diagnosis includes, but is not limited to, stroke, TIA, dental infection, dental abscess, cellulitis, anemia, electrolyte abnormality, AKI.  Patient well-appearing and in no acute distress, vital signs are unremarkable.  He has a nonfocal neurologic exam as facial asymmetry noted by coworker appears to be due to edema over his left cheek near the mandible and along the jawline.  He has gumline tenderness in this area with multiple broken teeth, but no evidence of abscess.  He also has chronic numbness and weakness in his left lower extremity for multiple months, in the process of being worked up by neurology but I doubt this is related to acute stroke.  CT head is negative for acute finding, CT maxillofacial shows facial edema consistent with dental infection but no evidence of abscess.  Labs without significant anemia, leukocytosis, electrolyte abnormality, or AKI.  Patient's headache significantly improved following IV Compazine  and Benadryl , he is appropriate for outpatient management with antibiotics for dental infection and dentistry follow-up.  He was counseled to return to the ED for new or worsening symptoms, patient agrees with plan.      FINAL CLINICAL IMPRESSION(S) / ED DIAGNOSES   Final diagnoses:  Facial swelling  Dental infection  Acute nonintractable headache, unspecified headache type     Rx / DC Orders   ED Discharge Orders          Ordered    clindamycin  (CLEOCIN ) 150 MG capsule  3 times daily        04/24/24 1653    prochlorperazine  (COMPAZINE ) 5 MG tablet  Every 6 hours PRN        04/24/24 1653             Note:  This document was prepared using Dragon voice recognition software and may include unintentional dictation errors.   Willo Dunnings, MD 04/24/24 1700  "

## 2024-04-24 NOTE — ED Provider Notes (Signed)
 I saw the patient briefly based on the EMS and code.  He is fully awake and alert he reports for at least 3 days now he had a fairly severe headache and he has noticed that it feels like he has some pain or discomfort in his left jaw.  He feels like it slightly swollen maybe there is an issue with the tooth.  He does not have any new symptoms in the last 24 hours.  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    Based on his active use of anticoagulation, timing of symptoms with 3 days of headache I have ordered a CT of the head and max face.  Patient going to triage for further assessment.  He does not meet code stroke criteria based on timing of symptom onset   Dicky Anes, MD 04/24/24 1305

## 2024-04-28 ENCOUNTER — Ambulatory Visit: Payer: Self-pay | Admitting: Cardiology

## 2024-05-01 ENCOUNTER — Encounter: Admitting: *Deleted

## 2024-05-01 DIAGNOSIS — Z006 Encounter for examination for normal comparison and control in clinical research program: Secondary | ICD-10-CM

## 2024-05-01 MED ORDER — STUDY - VICTORION-1 PREVENT - INCLISIRAN 300 MG/1.5 ML OR PLACEBO SQ INJECTION (PI-CHRISTOPHER)
300.0000 mg | INJECTION | SUBCUTANEOUS | Status: AC
  Administered 2024-05-01: 300 mg via SUBCUTANEOUS
  Filled 2024-05-01: qty 1.5

## 2024-05-01 NOTE — Telephone Encounter (Signed)
 Alert remote transmission:  Symptom Symptom x1 c/w SR with occasional ectopy   Sent patient a my chart message to follow up.  Reported Right leg went numb along with other symptoms.  Let him know ILR report did not correlate with his symptoms; however, if symptoms were severe especially with sudden right leg numbness should consider ER evaluation as could have been related to TIA/Stroke.    Requested patient let us  know how he is doing since symptom event and discuss symptoms more in detail.   FYI to Melany Needle, NP who saw patient recently in clinic on 04/18/24. Recent ER visit with Stroke work up that was negative on 04/24/24. - patient should follow up with PCP if further symptoms or severe go back to ER.

## 2024-05-01 NOTE — Research (Addendum)
 V1P Study Drug Administration  SUBJECT [id number]  Visit: 5  Prior or concomitant medications reviewed [x]   Lipid lowering medications reviewed [x]   Surgical history reviewed [x]   TIME: BP: PULSE:  0801 114/76 82  0803 119/79 86  0805 119/83 84  Mean BP: 117/79   Mean Pulse: 84  Labs collected at: 0811 Fasting Lipid Profile [x]  Fasting Lp(a) []  Liver function tests []  Exploratory biomarkers []   AE/SAE assessment completed [x]   Study drug administered [x]   Endpoint assessment completed [x]   [name] is here for Visit 5 of V1P. He reports going to the Ed on 04-24-2024 for face swelling, flank pain, and headache. He was placed on cleocin  and compazine . He reports no abd pain. Scheduled next appointment for July 20 at 0800. Injection given in right lower abd tol well at 0825. Kit number [id number].  Lifestyle instructions given voices understanding.   Current Medications[1]      [1]  Current Outpatient Medications:    acetaminophen  (TYLENOL ) 500 MG tablet, Take 2 tablets (1,000 mg total) by mouth every 6 (six) hours as needed for mild pain (pain score 1-3)., Disp: , Rfl:    alendronate  (FOSAMAX ) 70 MG tablet, Take 1 tablet (70 mg total) by mouth once a week. Take with a full glass of water on an empty stomach., Disp: 12 tablet, Rfl: 3   apixaban  (ELIQUIS ) 5 MG TABS tablet, Take 1 tablet (5 mg total) by mouth 2 (two) times daily., Disp: 28 tablet, Rfl:    atorvastatin  (LIPITOR) 40 MG tablet, Take 1 tablet (40 mg total) by mouth daily. Please keep upcoming appointment for future refills. Thank you., Disp: 90 tablet, Rfl: 3   clindamycin  (CLEOCIN ) 150 MG capsule, Take 3 capsules (450 mg total) by mouth 3 (three) times daily for 7 days., Disp: 63 capsule, Rfl: 0   cyanocobalamin  (VITAMIN B12) 1000 MCG tablet, TAKE 1 TABLET BY MOUTH ONCE DAILY, Disp: 30 tablet, Rfl: 3   cyclobenzaprine  (FLEXERIL ) 5 MG tablet, Take 1 tablet (5 mg total) by mouth at bedtime. Takes as needed, Disp: 30  tablet, Rfl: 1   famotidine  (PEPCID ) 20 MG tablet, TAKE 1 TABLET BY MOUTH 1 HOUR BEFORE BEDTIME, Disp: 30 tablet, Rfl: 11   gabapentin  (NEURONTIN ) 300 MG capsule, Take 1 capsule (300 mg total) by mouth at bedtime., Disp: 30 capsule, Rfl: 3   JARDIANCE  10 MG TABS tablet, Take 1 tablet (10 mg total) by mouth daily with breakfast., Disp: 30 tablet, Rfl: 7   metoprolol  tartrate (LOPRESSOR ) 25 MG tablet, Take 1 tablet (25 mg total) by mouth 2 (two) times daily., Disp: 180 tablet, Rfl: 3   omeprazole  (PRILOSEC) 20 MG capsule, TAKE 1 CAPSULE BY MOUTH TWICE DAILY BEFORE A MEAL, Disp: 180 capsule, Rfl: 1   prochlorperazine  (COMPAZINE ) 5 MG tablet, Take 1 tablet (5 mg total) by mouth every 6 (six) hours as needed., Disp: 12 tablet, Rfl: 0   sacubitril-valsartan (ENTRESTO ) 24-26 MG, Take 1 tablet by mouth 2 (two) times daily., Disp: 180 tablet, Rfl: 2   spironolactone  (ALDACTONE ) 25 MG tablet, Take 0.5 tablets (12.5 mg total) by mouth daily. KEEP OV., Disp: 45 tablet, Rfl: 3

## 2024-05-02 ENCOUNTER — Other Ambulatory Visit: Payer: Self-pay | Admitting: Internal Medicine

## 2024-05-02 NOTE — Telephone Encounter (Signed)
 Requested medication (s) are due for refill today: review  Requested medication (s) are on the active medication list: yes  Last refill:  04/15/23 #12/3  Future visit scheduled: no  Notes to clinic:  not an office E2C2 NT can refill. Please advise      Requested Prescriptions  Pending Prescriptions Disp Refills   alendronate  (FOSAMAX ) 70 MG tablet [Pharmacy Med Name: ALENDRONATE  SODIUM 70 MG TAB] 12 tablet 3    Sig: TAKE ONE TABLET BY MOUTH EACH WEEK, ON AN EMPTY STOMACH BEFORE BREAKFAST WITH 8oz OF WATER AND REMAIN UPRIGHT FOR :30     Endocrinology:  Bisphosphonates Failed - 05/02/2024  4:52 PM      Failed - Mg Level in normal range and within 360 days    Magnesium  Date Value Ref Range Status  04/14/2023 2.3 1.6 - 2.3 mg/dL Final  92/91/7984 2.0 mg/dL Final    Comment:    8.1-7.5 THERAPEUTIC RANGE: 4-7 mg/dL TOXIC: > 10 mg/dL  -----------------------          Failed - Phosphate in normal range and within 360 days    No results found for: PHOS       Passed - Ca in normal range and within 360 days    Calcium   Date Value Ref Range Status  04/24/2024 9.3 8.9 - 10.3 mg/dL Final   Calcium , Total  Date Value Ref Range Status  10/16/2013 8.9 8.5 - 10.1 mg/dL Final         Passed - Vitamin D  in normal range and within 360 days    Vit D, 25-Hydroxy  Date Value Ref Range Status  01/02/2024 32.5 30.0 - 100.0 ng/mL Final    Comment:    Vitamin D  deficiency has been defined by the Institute of Medicine and an Endocrine Society practice guideline as a level of serum 25-OH vitamin D  less than 20 ng/mL (1,2). The Endocrine Society went on to further define vitamin D  insufficiency as a level between 21 and 29 ng/mL (2). 1. IOM (Institute of Medicine). 2010. Dietary reference    intakes for calcium  and D. [place]: The    [employer]. 2. Holick MF, Binkley Vin[place], Bischoff-Ferrari HA, et al.    Evaluation, treatment, and prevention of vitamin D     deficiency:  an Endocrine Society clinical practice    guideline. JCEM. 2011 Jul; 96(7):1911-30.          Passed - Cr in normal range and within 360 days    Creatinine  Date Value Ref Range Status  10/16/2013 0.81 0.60 - 1.30 mg/dL Final   Creatinine, Ser  Date Value Ref Range Status  04/24/2024 0.67 0.61 - 1.24 mg/dL Final         Passed - eGFR is 30 or above and within 360 days    EGFR (African American)  Date Value Ref Range Status  10/16/2013 >60  Final   GFR calc Af Amer  Date Value Ref Range Status  10/17/2013 >90 >90 mL/min Final    Comment:    (NOTE) The eGFR has been calculated using the CKD EPI equation. This calculation has not been validated in all clinical situations. eGFR's persistently <90 mL/min signify possible Chronic Kidney Disease.   EGFR (Non-African Amer.)  Date Value Ref Range Status  10/16/2013 >60  Final    Comment:    eGFR values <72mL/min/1.73 m2 may be an indication of chronic kidney disease (CKD). Calculated eGFR is useful in patients with stable renal function.  The eGFR calculation will not be reliable in acutely ill patients when serum creatinine is changing rapidly. It is not useful in  patients on dialysis. The eGFR calculation may not be applicable to patients at the low and high extremes of body sizes, pregnant women, and vegetarians.    GFR, Estimated  Date Value Ref Range Status  04/24/2024 >60 >60 mL/min Final    Comment:    (NOTE) Calculated using the CKD-EPI Creatinine Equation (2021)    GFR  Date Value Ref Range Status  12/03/2013 120.46 >60.00 mL/min Final   eGFR  Date Value Ref Range Status  08/07/2023 99 >59 mL/min/1.73 Final         Passed - Valid encounter within last 12 months    Recent Outpatient Visits           8 months ago Chronic heart failure with mildly reduced ejection fraction (HFmrEF, 41-49%) [facility])   [place] [facility] Melvin Pao, NP       Future Appointments             In 1  month Lorene Lesley CROME, PA-C [facility] - Bone Mineral Density or Dexa Scan completed in the last 2 years

## 2024-05-03 ENCOUNTER — Encounter (HOSPITAL_BASED_OUTPATIENT_CLINIC_OR_DEPARTMENT_OTHER): Payer: Self-pay

## 2024-05-04 ENCOUNTER — Telehealth: Payer: Self-pay | Admitting: Family Medicine

## 2024-05-04 NOTE — Telephone Encounter (Signed)
 Called and LM that I would refill his fosamax  but I need to ask him a few questions.  DID he get an appointment to Endocrinology?  His last DEXA showed no improvement.  How long has he been taking fosamax ?

## 2024-05-07 ENCOUNTER — Telehealth: Payer: Self-pay

## 2024-05-07 NOTE — Telephone Encounter (Signed)
 Copied from CRM #[id number]. Topic: Clinical - Medication Question >> May 07, 2024 12:11 PM Ivette P wrote: Reason for CRM: Pt was left a voicemail by primary and was retrunign call. Attempted to call [facility] but on lunhc. Please follow up with pt. Regarding medicaitons     Notes from Primary Ziglar, Devere POUR, MD    05/04/24 11:10 AM Note Called and LM that I would refill his fosamax  but I need to ask him a few questions.  DID he get an appointment to Endocrinology?  His last DEXA showed no improvement.  How long has he been taking fosamax ?

## 2024-05-07 NOTE — Telephone Encounter (Signed)
 Has never seen or been referred to Endocrinology. Advised you may place referral and they will call him to schedule. He has taken Fosamax  for years. Please refill.

## 2024-05-08 ENCOUNTER — Telehealth: Payer: Self-pay | Admitting: Family Medicine

## 2024-05-08 ENCOUNTER — Other Ambulatory Visit: Payer: Self-pay | Admitting: Family Medicine

## 2024-05-08 DIAGNOSIS — M810 Age-related osteoporosis without current pathological fracture: Secondary | ICD-10-CM

## 2024-05-08 MED ORDER — ALENDRONATE SODIUM 70 MG PO TABS: 70.0000 mg | ORAL_TABLET | ORAL | 3 refills | Status: AC

## 2024-05-08 NOTE — Telephone Encounter (Signed)
 He thinks he started Fosamax  about 2023.  Pharmacy cannot go back further than 2023 as well.  OK will consider that he is taken Fosamax  just 3 years and will refill for another year.  Best regards, Dr. Ziglat

## 2024-05-22 ENCOUNTER — Encounter

## 2024-05-23 ENCOUNTER — Encounter

## 2024-06-11 ENCOUNTER — Ambulatory Visit: Admitting: Physician Assistant

## 2024-06-22 ENCOUNTER — Encounter

## 2024-06-24 ENCOUNTER — Encounter

## 2024-07-23 ENCOUNTER — Encounter

## 2024-07-25 ENCOUNTER — Encounter

## 2024-07-31 ENCOUNTER — Other Ambulatory Visit (HOSPITAL_BASED_OUTPATIENT_CLINIC_OR_DEPARTMENT_OTHER)

## 2024-08-26 ENCOUNTER — Encounter

## 2024-09-26 ENCOUNTER — Encounter

## 2024-10-28 ENCOUNTER — Encounter
